# Patient Record
Sex: Male | Born: 1945 | Race: Black or African American | Hispanic: No | Marital: Married | Smoking: Former smoker
Health system: Southern US, Community
[De-identification: ages and names within clinical notes are randomized; demographics above are authoritative.]

## PROBLEM LIST (undated history)

## (undated) DIAGNOSIS — H409 Unspecified glaucoma: Secondary | ICD-10-CM

## (undated) DIAGNOSIS — N183 Chronic kidney disease, stage 3 unspecified: Secondary | ICD-10-CM

## (undated) DIAGNOSIS — E119 Type 2 diabetes mellitus without complications: Secondary | ICD-10-CM

## (undated) DIAGNOSIS — H269 Unspecified cataract: Secondary | ICD-10-CM

## (undated) DIAGNOSIS — M109 Gout, unspecified: Secondary | ICD-10-CM

## (undated) DIAGNOSIS — C159 Malignant neoplasm of esophagus, unspecified: Secondary | ICD-10-CM

## (undated) DIAGNOSIS — I10 Essential (primary) hypertension: Secondary | ICD-10-CM

## (undated) HISTORY — DX: Unspecified cataract: H26.9

## (undated) HISTORY — PX: ORCHIECTOMY: SHX2116

## (undated) HISTORY — DX: Unspecified glaucoma: H40.9

---

## 2018-11-19 ENCOUNTER — Other Ambulatory Visit: Payer: Self-pay

## 2018-11-19 ENCOUNTER — Encounter (HOSPITAL_COMMUNITY): Payer: Self-pay | Admitting: Internal Medicine

## 2018-11-19 ENCOUNTER — Inpatient Hospital Stay (HOSPITAL_COMMUNITY)
Admission: AD | Admit: 2018-11-19 | Discharge: 2018-11-24 | DRG: 375 | Disposition: A | Discharge status: Home Health | Payer: Medicare Other | Source: Other Acute Inpatient Hospital | Attending: Internal Medicine | Admitting: Internal Medicine

## 2018-11-19 DIAGNOSIS — M109 Gout, unspecified: Secondary | ICD-10-CM | POA: Diagnosis present

## 2018-11-19 DIAGNOSIS — K921 Melena: Secondary | ICD-10-CM | POA: Diagnosis present

## 2018-11-19 DIAGNOSIS — E440 Moderate protein-calorie malnutrition: Secondary | ICD-10-CM | POA: Diagnosis present

## 2018-11-19 DIAGNOSIS — Z9079 Acquired absence of other genital organ(s): Secondary | ICD-10-CM | POA: Diagnosis not present

## 2018-11-19 DIAGNOSIS — R55 Syncope and collapse: Secondary | ICD-10-CM | POA: Diagnosis present

## 2018-11-19 DIAGNOSIS — E1122 Type 2 diabetes mellitus with diabetic chronic kidney disease: Secondary | ICD-10-CM | POA: Diagnosis present

## 2018-11-19 DIAGNOSIS — Z87891 Personal history of nicotine dependence: Secondary | ICD-10-CM

## 2018-11-19 DIAGNOSIS — Z923 Personal history of irradiation: Secondary | ICD-10-CM | POA: Diagnosis not present

## 2018-11-19 DIAGNOSIS — I248 Other forms of acute ischemic heart disease: Secondary | ICD-10-CM | POA: Diagnosis present

## 2018-11-19 DIAGNOSIS — I129 Hypertensive chronic kidney disease with stage 1 through stage 4 chronic kidney disease, or unspecified chronic kidney disease: Secondary | ICD-10-CM | POA: Diagnosis present

## 2018-11-19 DIAGNOSIS — K5900 Constipation, unspecified: Secondary | ICD-10-CM | POA: Diagnosis not present

## 2018-11-19 DIAGNOSIS — C159 Malignant neoplasm of esophagus, unspecified: Secondary | ICD-10-CM | POA: Diagnosis present

## 2018-11-19 DIAGNOSIS — I10 Essential (primary) hypertension: Secondary | ICD-10-CM

## 2018-11-19 DIAGNOSIS — D62 Acute posthemorrhagic anemia: Secondary | ICD-10-CM | POA: Diagnosis present

## 2018-11-19 DIAGNOSIS — E876 Hypokalemia: Secondary | ICD-10-CM | POA: Diagnosis present

## 2018-11-19 DIAGNOSIS — J8410 Pulmonary fibrosis, unspecified: Secondary | ICD-10-CM | POA: Diagnosis present

## 2018-11-19 DIAGNOSIS — N183 Chronic kidney disease, stage 3 unspecified: Secondary | ICD-10-CM | POA: Diagnosis present

## 2018-11-19 DIAGNOSIS — R911 Solitary pulmonary nodule: Secondary | ICD-10-CM | POA: Diagnosis present

## 2018-11-19 DIAGNOSIS — K922 Gastrointestinal hemorrhage, unspecified: Secondary | ICD-10-CM | POA: Diagnosis present

## 2018-11-19 DIAGNOSIS — R54 Age-related physical debility: Secondary | ICD-10-CM | POA: Diagnosis present

## 2018-11-19 DIAGNOSIS — E119 Type 2 diabetes mellitus without complications: Secondary | ICD-10-CM

## 2018-11-19 DIAGNOSIS — R918 Other nonspecific abnormal finding of lung field: Secondary | ICD-10-CM | POA: Diagnosis not present

## 2018-11-19 DIAGNOSIS — Z9221 Personal history of antineoplastic chemotherapy: Secondary | ICD-10-CM | POA: Diagnosis not present

## 2018-11-19 DIAGNOSIS — N179 Acute kidney failure, unspecified: Secondary | ICD-10-CM | POA: Diagnosis not present

## 2018-11-19 DIAGNOSIS — Z6820 Body mass index (BMI) 20.0-20.9, adult: Secondary | ICD-10-CM | POA: Diagnosis not present

## 2018-11-19 DIAGNOSIS — N189 Chronic kidney disease, unspecified: Secondary | ICD-10-CM | POA: Diagnosis not present

## 2018-11-19 DIAGNOSIS — K222 Esophageal obstruction: Secondary | ICD-10-CM | POA: Diagnosis present

## 2018-11-19 DIAGNOSIS — C155 Malignant neoplasm of lower third of esophagus: Secondary | ICD-10-CM | POA: Diagnosis present

## 2018-11-19 HISTORY — DX: Chronic kidney disease, stage 3 unspecified: N18.30

## 2018-11-19 HISTORY — DX: Gout, unspecified: M10.9

## 2018-11-19 HISTORY — DX: Type 2 diabetes mellitus without complications: E11.9

## 2018-11-19 HISTORY — DX: Malignant neoplasm of esophagus, unspecified: C15.9

## 2018-11-19 HISTORY — DX: Chronic kidney disease, stage 3 (moderate): N18.3

## 2018-11-19 HISTORY — DX: Essential (primary) hypertension: I10

## 2018-11-19 LAB — GLUCOSE, CAPILLARY
GLUCOSE-CAPILLARY: 116 mg/dL — AB (ref 70–99)
Glucose-Capillary: 116 mg/dL — ABNORMAL HIGH (ref 70–99)
Glucose-Capillary: 123 mg/dL — ABNORMAL HIGH (ref 70–99)
Glucose-Capillary: 93 mg/dL (ref 70–99)

## 2018-11-19 LAB — COMPREHENSIVE METABOLIC PANEL
ALT: 11 U/L (ref 0–44)
AST: 22 U/L (ref 15–41)
Albumin: 2.7 g/dL — ABNORMAL LOW (ref 3.5–5.0)
Alkaline Phosphatase: 55 U/L (ref 38–126)
Anion gap: 7 (ref 5–15)
BUN: 43 mg/dL — ABNORMAL HIGH (ref 8–23)
CO2: 31 mmol/L (ref 22–32)
Calcium: 8.6 mg/dL — ABNORMAL LOW (ref 8.9–10.3)
Chloride: 104 mmol/L (ref 98–111)
Creatinine, Ser: 2.02 mg/dL — ABNORMAL HIGH (ref 0.61–1.24)
GFR, EST AFRICAN AMERICAN: 38 mL/min — AB (ref >60)
GFR, EST NON AFRICAN AMERICAN: 32 mL/min — AB (ref >60)
Glucose, Bld: 122 mg/dL — ABNORMAL HIGH (ref 70–99)
Potassium: 2.6 mmol/L — CL (ref 3.5–5.1)
Sodium: 142 mmol/L (ref 135–145)
Total Bilirubin: 1 mg/dL (ref 0.3–1.2)
Total Protein: 5.6 g/dL — ABNORMAL LOW (ref 6.5–8.1)

## 2018-11-19 LAB — CBC
HCT: 26.4 % — ABNORMAL LOW (ref 39.0–52.0)
Hemoglobin: 8.9 g/dL — ABNORMAL LOW (ref 13.0–17.0)
MCH: 30.1 pg (ref 26.0–34.0)
MCHC: 33.7 g/dL (ref 30.0–36.0)
MCV: 89.2 fL (ref 80.0–100.0)
Platelets: 227 10*3/uL (ref 150–400)
RBC: 2.96 MIL/uL — ABNORMAL LOW (ref 4.22–5.81)
RDW: 14.9 % (ref 11.5–15.5)
WBC: 13.3 10*3/uL — ABNORMAL HIGH (ref 4.0–10.5)
nRBC: 0 % (ref 0.0–0.2)

## 2018-11-19 LAB — MRSA PCR SCREENING: MRSA by PCR: NEGATIVE

## 2018-11-19 LAB — TROPONIN I
Troponin I: 0.8 ng/mL (ref <0.03)
Troponin I: 0.51 ng/mL (ref <0.03)

## 2018-11-19 LAB — POTASSIUM: POTASSIUM: 2.7 mmol/L — AB (ref 3.5–5.1)

## 2018-11-19 LAB — ABO/RH: ABO/RH(D): A NEG

## 2018-11-19 MED ORDER — PANTOPRAZOLE SODIUM 40 MG IV SOLR
40.0000 mg | Freq: Two times a day (BID) | INTRAVENOUS | Status: DC
  Administered 2018-11-19 – 2018-11-24 (×11): 40 mg via INTRAVENOUS
  Filled 2018-11-19 (×10): qty 40

## 2018-11-19 MED ORDER — ACETAMINOPHEN 325 MG PO TABS: 650.0000 mg | ORAL_TABLET | Freq: Four times a day (QID) | ORAL | Status: DC | PRN

## 2018-11-19 MED ORDER — ACETAMINOPHEN 650 MG RE SUPP: 650.0000 mg | Freq: Four times a day (QID) | RECTAL | Status: DC | PRN

## 2018-11-19 MED ORDER — PHENOL 1.4 % MT LIQD
1.0000 | OROMUCOSAL | Status: DC | PRN
  Administered 2018-11-19: 1 via OROMUCOSAL
  Filled 2018-11-19: qty 177

## 2018-11-19 MED ORDER — ONDANSETRON HCL 4 MG PO TABS: 4.0000 mg | ORAL_TABLET | Freq: Four times a day (QID) | ORAL | Status: DC | PRN

## 2018-11-19 MED ORDER — SODIUM CHLORIDE 0.9 % IV SOLN
INTRAVENOUS | Status: DC
  Administered 2018-11-19: 80 mL/h via INTRAVENOUS

## 2018-11-19 MED ORDER — ONDANSETRON HCL 4 MG/2ML IJ SOLN: 4.0000 mg | Freq: Four times a day (QID) | INTRAMUSCULAR | Status: DC | PRN

## 2018-11-19 MED ORDER — POTASSIUM CHLORIDE 10 MEQ/100ML IV SOLN
10.0000 meq | INTRAVENOUS | Status: AC
  Administered 2018-11-19 (×3): 10 meq via INTRAVENOUS
  Filled 2018-11-19 (×3): qty 100

## 2018-11-19 MED ORDER — HYDRALAZINE HCL 20 MG/ML IJ SOLN: 10.0000 mg | INTRAMUSCULAR | Status: DC | PRN

## 2018-11-19 MED ORDER — INSULIN ASPART 100 UNIT/ML ~~LOC~~ SOLN
0.0000 [IU] | Freq: Three times a day (TID) | SUBCUTANEOUS | Status: DC
  Administered 2018-11-19: 2 [IU] via SUBCUTANEOUS

## 2018-11-19 MED ORDER — POTASSIUM CHLORIDE 20 MEQ PO PACK
40.0000 meq | PACK | Freq: Once | ORAL | Status: AC
  Administered 2018-11-19: 20 meq via ORAL
  Filled 2018-11-19: qty 2

## 2018-11-19 NOTE — H&P (Signed)
History and Physical    Lance Herring XLK:[id number] DOB: [birth year] DOA: 11/19/2018  PCP: No primary care provider on file.  Patient coming from: OSH transfer  I have personally briefly reviewed patient's old medical records in [place]  Chief Complaint: GIB  HPI: Lance Herring is a 73 y.o. male with medical history significant of DM2, HTN, CKD stage 3, esophageal CA treated with chemoradiation in [place] completed June 2019.  Patient had previously been doing well since then except for occasional feeling of food getting stuck in lower esophagus.  Patient presented to [facility] ED evening of 1/23 after being found down by wife, initially unresponsive for a few mins.  2 day h/o melena apparently per patient.  Decreased appetite over past 3-4 days.  HGB 6.5, K 2.5, transferred from [facility] to [facility] (no beds apparently).  At [facility] 3u PRBC transfusion brought HGB up to 8.8, K replaced (3.6 per report) underwent EGD for UGIB.  Unfortunately unable to get scope past GE junction due to tumor in the way.  Patient transferred to [facility] for: 1) CVTS consult consideration of surgical management and 2) GI consult consideration of esophageal stent  Review of Systems: As per HPI otherwise 10 point review of systems negative.   Past Medical History:  Diagnosis Date  . CKD (chronic kidney disease) stage 3, GFR 30-59 ml/min (HCC)   . DM2 (diabetes mellitus, type 2) ([place])   . Esophageal cancer ([place])   . Gout   . HTN (hypertension)     Past Surgical History:  Procedure Laterality Date  . ORCHIECTOMY Right    Benign     reports that he quit smoking about 22 years ago. He does not have any smokeless tobacco history on file. He reports that he does not drink alcohol or use drugs.  No Known Allergies  No family history on file.   Prior to Admission medications   Not on File    Physical Exam: Vitals:   11/19/18 0345  BP: 125/64  Pulse: 90  Resp: 18    Temp: 98.6 F (37 C)  TempSrc: Oral  SpO2: 100%  Weight: 67.1 kg  Height: 5\' 11"  (1.803 m)    Constitutional: NAD, calm, comfortable Eyes: PERRL, lids and conjunctivae normal ENMT: Mucous membranes are moist. Posterior pharynx clear of any exudate or lesions.Normal dentition.  Neck: normal, supple, no masses, no thyromegaly Respiratory: clear to auscultation bilaterally, no wheezing, no crackles. Normal respiratory effort. No accessory muscle use.  Cardiovascular: Regular rate and rhythm, no murmurs / rubs / gallops. No extremity edema. 2+ pedal pulses. No carotid bruits.  Abdomen: no tenderness, no masses palpated. No hepatosplenomegaly. Bowel sounds positive.  Musculoskeletal: no clubbing / cyanosis. No joint deformity upper and lower extremities. Good ROM, no contractures. Normal muscle tone.  Skin: no rashes, lesions, ulcers. No induration Neurologic: CN 2-12 grossly intact. Sensation intact, DTR normal. Strength 5/5 in all 4.  Psychiatric: Normal judgment and insight. Alert and oriented x 3. Normal mood.    Labs on Admission: I have personally reviewed following labs and imaging studies  CBC: No results for input(s): WBC, NEUTROABS, HGB, HCT, MCV, PLT in the last 168 hours. Basic Metabolic Panel: No results for input(s): NA, K, CL, CO2, GLUCOSE, BUN, CREATININE, CALCIUM, MG, PHOS in the last 168 hours. GFR: CrCl cannot be calculated (No successful lab value found.). Liver Function Tests: No results for input(s): AST, ALT, ALKPHOS, BILITOT, PROT, ALBUMIN in the last 168 hours. No  results for input(s): LIPASE, AMYLASE in the last 168 hours. No results for input(s): AMMONIA in the last 168 hours. Coagulation Profile: No results for input(s): INR, PROTIME in the last 168 hours. Cardiac Enzymes: No results for input(s): CKTOTAL, CKMB, CKMBINDEX, TROPONINI in the last 168 hours. BNP (last 3 results) No results for input(s): PROBNP in the last 8760 hours. HbA1C: No results for  input(s): HGBA1C in the last 72 hours. CBG: No results for input(s): GLUCAP in the last 168 hours. Lipid Profile: No results for input(s): CHOL, HDL, LDLCALC, TRIG, CHOLHDL, LDLDIRECT in the last 72 hours. Thyroid Function Tests: No results for input(s): TSH, T4TOTAL, FREET4, T3FREE, THYROIDAB in the last 72 hours. Anemia Panel: No results for input(s): VITAMINB12, FOLATE, FERRITIN, TIBC, IRON, RETICCTPCT in the last 72 hours. Urine analysis: No results found for: COLORURINE, APPEARANCEUR, LABSPEC, PHURINE, GLUCOSEU, HGBUR, BILIRUBINUR, KETONESUR, PROTEINUR, UROBILINOGEN, NITRITE, LEUKOCYTESUR  Radiological Exams on Admission: No results found.  EKG: Independently reviewed.  Assessment/Plan Principal Problem:   UGIB (upper gastrointestinal bleed) Active Problems:   Acute blood loss anemia   Esophageal cancer (HCC)   DM2 (diabetes mellitus, type 2) (HCC)   HTN (hypertension)   CKD (chronic kidney disease) stage 3, GFR 30-59 ml/min ([place])    1. UGIB - 1. 2 or 3 days since last melanotic stool per patient (makes me hopeful) 2. S/p 3u PRBC 3. Repeat CBC now 4. IV PPI 5. IVF NS at 80 6. Tele monitor 7. GI consult obviously 2. Esophageal CA - Recurrent 1. Unable to pass EGD past tumor apparently 2. But is tolerating clear liquids at time of transfer 3. Will leave patient on clear liquid diet for the moment, no solids and no pills 4. GI and CVTS consults in AM: Dr. Servando Snare and Dr. Benson Norway apparently were contacted on phone prior to transfer. 3. DM2 - 1. Mod scale SSI AC 4. HTN - 1. PRN hydralazine 5. Demand ischemia - 1. Trop bumped to 1.5 before trending down at OSH 2. Will continue to trend trop 3. No CP, believed by their cardiologist to be demand ischemia 4. No acute EKG changes 5. And not a great candidate for anti coags / anti platelets when he has a UGIB that we cant get to anyhow. 6. CKD stage 3 - 1. Creat 2.1 at transfer, which is about baseline 2. CMP on admission  this morning pending  DVT prophylaxis: SCDs Code Status: Full Family Communication: Wife at bedside Disposition Plan: Home after admit Consults called: None, call GI and CVTS in AM Admission status: Admit to inpatient  Severity of Illness: The appropriate patient status for this patient is INPATIENT. Inpatient status is judged to be reasonable and necessary in order to provide the required intensity of service to ensure the patient's safety. The patient's presenting symptoms, physical exam findings, and initial radiographic and laboratory data in the context of their chronic comorbidities is felt to place them at high risk for further clinical deterioration. Furthermore, it is not anticipated that the patient will be medically stable for discharge from the hospital within 2 midnights of admission. The following factors support the patient status of inpatient.   " The patient's presenting symptoms include Melena, syncopy. " The initial radiographic and laboratory data are worrisome because of initial HGB 6.5, unable to get EGD past GE junction due to tumor, need for transfusion of 3u PRBC already. " The chronic co-morbidities include Esophageal CA, HTN, DM2.   * I certify that at the point of  admission it is my clinical judgment that the patient will require inpatient hospital care spanning beyond 2 midnights from the point of admission due to high intensity of service, high risk for further deterioration and high frequency of surveillance required.Etta Quill DO [facility] Pager [phone] Only works nights!  If 7AM-7PM, please contact the primary day team physician taking care of patient  [email or web address] Password [facility]  11/19/2018, 4:04 AM

## 2018-11-19 NOTE — Progress Notes (Addendum)
Please see H&P from this morning.  Patient seen and examined.  73 year old male with history of diabetes pulmonary hypertension gout esophageal cancer diagnosed in April 2018 and treated with chemoradiation in [place], completed therapy in June 2009 presented to [facility] ED with complaints of melena and syncopal episodes.  His hemoglobin was noted to be 6.5 on presentation.  This was related to upper GI bleed secondary to known malignancy and acute blood loss related hypotension.  There was no GI available at [facility] and patient transferred to [facility] in [place] where patient was evaluated by GI/nephrology patient did have mild elevated troponin in the setting of upper GI bleed/hypotension.  Patient has chronic kidney disease stage III with GFR of 45% at baseline per [place] records and per nephrology note from [facility].  Patient known to have pulmonary nodules in the past that were followed by oncology.  Not sure if metastatic work-up repeated at [facility], he did have repeat EGD showing esophageal mass but scope could not be passed beyond GE junction.  GI and CT surgery have been notified of patient's arrival.  Per GI, staff at [facility] was advised that patient likely not a candidate for stent.  However patient was accepted for CT surgery evaluation albeit a good possibility that he is not a candidate for surgery either. Currently patient able to tolerate clear liquid diet without nausea or vomiting.  Reports that he is able to pass gas but no bowel movement so far.  Repeat labs for this morning ordered.  His CODE STATUS is full code.  Awaiting formal evaluation/note from CT surgery and GI.  If patient deemed not to be a candidate for any interventions, consider palliative care evaluation and downgrade to MedSurg floor in a.m.  Follow-up hemoglobin and transfuse as needed

## 2018-11-19 NOTE — Progress Notes (Signed)
Trop was 0.8 and K+ 2.9 morning lab. Replaced potassium 10 meq IVPB x 3 and po 20 meq because pt couldn't drink second dose of potassium pocket. Trop was down from previous, Pt need only second trop if trop is lower than first one. Second trop was 0.51 and d/c third trop. Pt c/o constipation, BM was few days ago. Paging MD for constipation medications, still waiting. HS [employer]

## 2018-11-19 NOTE — Consult Note (Addendum)
[address]       [place] [address]             [phone]        Lance Herring [place] Medical Record #[id number] Date of Birth: [birth year]  Referring: Dr Earnest Conroy Primary Care: No primary care provider on file. Primary Cardiologist:No primary care provider on file.  Chief Complaint:   Trouble swallowing and gi bleed.  History of Present Illness:     Patient is a 73 year old male who is been followed in [place] for adenocarcinoma of the distal esophagus he saw his oncologist as recent as November 15, 2018 he had presented with a obstructing esophageal mass and underwent radiation and chemotherapy during 03/2017- 05/2017  reports suggest there may been retroperitoneal lymph lymphadenopathy and pulmonary nodules were being evaluated notes indicate that restaging CT scans done last year and showed response to treatment patient was seen previously by surgeon in [place] was thought too frail for surgery in August 2019 the patient had declined surgery.  He now presents with continued weight loss difficulty swallowing and GI bleed with endoscopy was done in [facility] January 24 showing stricture at the esophageal junction scope could not transverse the lesion and full exam of the stomach could not be completed.  Notes indicate the patient has had continuing rising CEA now 3.9  At some point in the past the patient had a PEG tube temporary lead during treatment is not present currently at the time of original diagnosis EUS was performed but was limited because the scope could not be fully passed, initially the lesion was staged as T2 ,No,  MO in 2018.  Current Activity/ Functional Status: Patient is independent with mobility/ambulation, transfers, ADL's, IADL's.   Zubrod Score: At the time of surgery this patient's most appropriate activity status/level should be described as: []     0    Normal activity, no symptoms []     1    Restricted in physical  strenuous activity but ambulatory, able to do out light work [x]     2    Ambulatory and capable of self care, unable to do work activities, up and about                 more than 50%  Of the time                            []     3    Only limited self care, in bed greater than 50% of waking hours []     4    Completely disabled, no self care, confined to bed or chair []     5    Moribund  Past Medical History:  Diagnosis Date  . CKD (chronic kidney disease) stage 3, GFR 30-59 ml/min (HCC)   . DM2 (diabetes mellitus, type 2) ([place])   . Esophageal cancer ([place])   . Gout   . HTN (hypertension)     Past Surgical History:  Procedure Laterality Date  . ORCHIECTOMY Right    Benign    Social History   Tobacco Use  Smoking Status Former Smoker  . Types: Cigarettes  . Last attempt to quit: 1998  . Years since quitting: 22.0    Social History   Substance and Sexual Activity  Alcohol Use Never  . Frequency: Never     No Known Allergies  Current Facility-Administered Medications  Medication  Dose Route Frequency Provider Last Rate Last Dose  . 0.9 %  sodium chloride infusion   Intravenous Continuous Etta Quill, DO 80 mL/hr at 11/19/18 0700    . acetaminophen (TYLENOL) tablet 650 mg  650 mg Oral Q6H PRN Etta Quill, DO       Or  . acetaminophen (TYLENOL) suppository 650 mg  650 mg Rectal Q6H PRN Etta Quill, DO      . hydrALAZINE (APRESOLINE) injection 10 mg  10 mg Intravenous Q4H PRN Etta Quill, DO      . insulin aspart (novoLOG) injection 0-15 Units  0-15 Units Subcutaneous TID WC Etta Quill, DO      . ondansetron [facility]) tablet 4 mg  4 mg Oral Q6H PRN Etta Quill, DO       Or  . ondansetron [facility]) injection 4 mg  4 mg Intravenous Q6H PRN Etta Quill, DO      . pantoprazole (PROTONIX) injection 40 mg  40 mg Intravenous Q12H Jennette Kettle M, DO   40 mg at 11/19/18 0915  . phenol (CHLORASEPTIC) mouth spray 1 spray  1 spray Mouth/Throat PRN  Etta Quill, DO        No medications prior to admission.    History reviewed. No pertinent family history.   Review of Systems:   Pertinent items are noted in HPI.     Cardiac Review of Systems: Y or  [    ]= no  Chest Pain [   y ]  Resting SOB [ y  ] Exertional SOB  [ y ]  Orthopnea Blue.Reese  ]   Pedal Edema [ n  ]    Palpitations Florencio.Farrier  ] Syncope  [ n ]   Presyncope [  n ]  General Review of Systems: [Y] = yes [  ]=no Constitional: recent weight change Blue.Reese  ]; anorexia Blue.Reese  ]; fatigue [  ]; nausea Blue.Reese  ]; night sweats [  ]; fever [  ]; or chills [  ]                                                               Dental: Last Dentist visit:   Eye : blurred vision [  ]; diplopia [   ]; vision changes [  ];  Amaurosis fugax[  ]; Resp: cough [  ];  wheezing[  ];  hemoptysis[  ]; shortness of breath[  ]; paroxysmal nocturnal dyspnea[  ]; dyspnea on exertion[  ]; or orthopnea[  ];  GI:  gallstones[  ], vomiting[  ];  dysphagia[ y ]; Jenna.Grumbles  ];  hematochezia [  ]; heartburn[ y ];   Hx of  Colonoscopy[  ]; GU: kidney stones [  ]; hematuria[  ];   dysuria [  ];  nocturia[  ];  history of     obstruction [  ]; urinary frequency [  ]             Skin: rash, swelling[  ];, hair loss[  ];  peripheral edema[  ];  or itching[  ]; Musculosketetal: myalgias[  ];  joint swelling[  ];  joint erythema[  ];  joint pain[  ];  back pain[  ];  Heme/Lymph: bruising[  ];  bleeding[  ];  anemia[  ];  Neuro: TIA[  ];  headaches[  ];  stroke[  ];  vertigo[  ];  seizures[  ];   paresthesias[  ];  difficulty walking[  ];  Psych:depression[  ]; anxiety[  ];  Endocrine: diabetes[  ];  thyroid dysfunction[  ];                Physical Exam: BP 137/78 (BP Location: Right Arm)   Pulse 85   Temp 98.6 F (37 C) (Oral)   Resp 18   Ht 5\' 11"  (1.803 m)   Wt 67.1 kg   SpO2 97%   BMI 20.63 kg/m    General appearance: alert, cooperative and no distress Head: Normocephalic, without obvious abnormality,  atraumatic Neck: no adenopathy, no carotid bruit, no JVD, supple, symmetrical, trachea midline and thyroid not enlarged, symmetric, no tenderness/mass/nodules Lymph nodes: Cervical, supraclavicular, and axillary nodes normal. Resp: diminished breath sounds bibasilar Back: symmetric, no curvature. ROM normal. No CVA tenderness. Cardio: regular rate and rhythm, S1, S2 normal, no murmur, click, rub or gallop GI: Patient has a thin abdomen currently no PEG tube is in place but it has been 1 in the past, Extremities: extremities normal, atraumatic, no cyanosis or edema and Homans sign is negative, no sign of DVT Neurologic: Grossly normal Patient is thin cachectic, right Port-A-Cath is in place, there is no palpable adenopathy in the neck or supraclavicular areas  Diagnostic Studies & Laboratory data:     Recent Radiology Findings:  We have no current staging CT scans or PET scan to review    Recent Lab Findings: Lab Results  Component Value Date   WBC 13.3 (H) 11/19/2018   HGB 8.9 (L) 11/19/2018   HCT 26.4 (L) 11/19/2018   PLT 227 11/19/2018      Assessment / Plan:      Patient with recurrent nearly obstructing esophageal cancer distal/GE junction has not been fully evaluated for current stage but was treated with radiation and chemotherapy in 2018.  Notes from [place] suggest abdominal adenopathy has been noted in the past and also pulmonary nodules, we have no confirmation scans of this information.  The patient had been evaluated by surgery in 2018, it appears that neither the patient nor the surgeon wish to proceed with surgical intervention. Patient needs increased nutritional support  The patient is not an operative candidate for esophagectomy.  Stenting of the esophagus for palliation, further chemotherapy for palliation are considerations-the patient will need to decide where he wishes to continue to get oncology care here or in [place].  Consider oncology consultation here  if the patient is wishes to stay here.  If he is to remain here for his oncology care restaging scans should be considered. Consider placement of peg ( had on previously for a year)  Consider palliative care.     Grace Isaac MD      [address] [place] [address] Office [phone]   Beeper [phone]  11/19/2018 9:45 AM

## 2018-11-19 NOTE — Progress Notes (Signed)
I discussed the case with the physician in [facility].  From a GI standpoint, I did not accept the transfer.  The patient presented with bleeding and an EGD was performed by a surgeon, who could not pass through the GE junction, as a result of a mass.  He has a history of an esophageal cancer and the cancer was noted to be friable.  The patient is able to tolerate liquids and his oncology care was in [place], but he wanted care closer to home.  The source of the bleeding was reported form the esophageal mass.  The reasons I did not accept the transfer was that the mass was friable and there is no maneuver that I can perform to arrest any bleeding.  The patient is currently able to tolerate liquids and I was told that he was not end stage disease.  Since he is not completely obstructed and he is not end stage, there is no reason to place an esophageal stent.  The best course of action was to have him establish oncologic care locally.  At this time there are no GI services that I can offer to the patient.

## 2018-11-20 ENCOUNTER — Encounter (HOSPITAL_COMMUNITY): Payer: Self-pay | Admitting: *Deleted

## 2018-11-20 ENCOUNTER — Inpatient Hospital Stay (HOSPITAL_COMMUNITY): Payer: Medicare Other

## 2018-11-20 DIAGNOSIS — N179 Acute kidney failure, unspecified: Secondary | ICD-10-CM

## 2018-11-20 DIAGNOSIS — R55 Syncope and collapse: Secondary | ICD-10-CM

## 2018-11-20 DIAGNOSIS — C159 Malignant neoplasm of esophagus, unspecified: Secondary | ICD-10-CM

## 2018-11-20 DIAGNOSIS — N189 Chronic kidney disease, unspecified: Secondary | ICD-10-CM

## 2018-11-20 DIAGNOSIS — R918 Other nonspecific abnormal finding of lung field: Secondary | ICD-10-CM

## 2018-11-20 LAB — BASIC METABOLIC PANEL
ANION GAP: 9 (ref 5–15)
Anion gap: 10 (ref 5–15)
BUN: 27 mg/dL — ABNORMAL HIGH (ref 8–23)
BUN: 20 mg/dL (ref 8–23)
CHLORIDE: 100 mmol/L (ref 98–111)
CO2: 29 mmol/L (ref 22–32)
CO2: 27 mmol/L (ref 22–32)
Calcium: 8.2 mg/dL — ABNORMAL LOW (ref 8.9–10.3)
Calcium: 8.6 mg/dL — ABNORMAL LOW (ref 8.9–10.3)
Chloride: 102 mmol/L (ref 98–111)
Creatinine, Ser: 1.88 mg/dL — ABNORMAL HIGH (ref 0.61–1.24)
Creatinine, Ser: 1.85 mg/dL — ABNORMAL HIGH (ref 0.61–1.24)
GFR calc Af Amer: 41 mL/min — ABNORMAL LOW (ref >60)
GFR calc Af Amer: 42 mL/min — ABNORMAL LOW (ref >60)
GFR calc non Af Amer: 35 mL/min — ABNORMAL LOW (ref >60)
GFR calc non Af Amer: 36 mL/min — ABNORMAL LOW (ref >60)
GLUCOSE: 133 mg/dL — AB (ref 70–99)
Glucose, Bld: 113 mg/dL — ABNORMAL HIGH (ref 70–99)
POTASSIUM: 2.8 mmol/L — AB (ref 3.5–5.1)
Potassium: 3.4 mmol/L — ABNORMAL LOW (ref 3.5–5.1)
SODIUM: 139 mmol/L (ref 135–145)
Sodium: 138 mmol/L (ref 135–145)

## 2018-11-20 LAB — GLUCOSE, CAPILLARY
Glucose-Capillary: 107 mg/dL — ABNORMAL HIGH (ref 70–99)
Glucose-Capillary: 116 mg/dL — ABNORMAL HIGH (ref 70–99)
Glucose-Capillary: 130 mg/dL — ABNORMAL HIGH (ref 70–99)
Glucose-Capillary: 123 mg/dL — ABNORMAL HIGH (ref 70–99)

## 2018-11-20 LAB — CBC
HCT: 22.8 % — ABNORMAL LOW (ref 39.0–52.0)
HEMOGLOBIN: 7.8 g/dL — AB (ref 13.0–17.0)
MCH: 31 pg (ref 26.0–34.0)
MCHC: 34.2 g/dL (ref 30.0–36.0)
MCV: 90.5 fL (ref 80.0–100.0)
Platelets: 216 10*3/uL (ref 150–400)
RBC: 2.52 MIL/uL — ABNORMAL LOW (ref 4.22–5.81)
RDW: 15 % (ref 11.5–15.5)
WBC: 10.3 10*3/uL (ref 4.0–10.5)
nRBC: 0 % (ref 0.0–0.2)

## 2018-11-20 LAB — MAGNESIUM: Magnesium: 2 mg/dL (ref 1.7–2.4)

## 2018-11-20 MED ORDER — POTASSIUM CHLORIDE IN NACL 40-0.9 MEQ/L-% IV SOLN
INTRAVENOUS | Status: AC
  Administered 2018-11-20: 75 mL/h via INTRAVENOUS
  Administered 2018-11-21: 50 mL/h via INTRAVENOUS
  Filled 2018-11-20 (×2): qty 1000

## 2018-11-20 MED ORDER — SODIUM CHLORIDE 0.9 % IV SOLN: INTRAVENOUS | Status: DC

## 2018-11-20 MED ORDER — POTASSIUM CHLORIDE 10 MEQ/100ML IV SOLN
10.0000 meq | INTRAVENOUS | Status: DC
  Administered 2018-11-20: 10 meq via INTRAVENOUS
  Filled 2018-11-20: qty 100

## 2018-11-20 MED ORDER — BISACODYL 10 MG RE SUPP
10.0000 mg | Freq: Once | RECTAL | Status: DC
  Filled 2018-11-20: qty 1

## 2018-11-20 MED ORDER — POTASSIUM CHLORIDE 10 MEQ/100ML IV SOLN
10.0000 meq | INTRAVENOUS | Status: AC
  Administered 2018-11-20 (×6): 10 meq via INTRAVENOUS
  Filled 2018-11-20 (×2): qty 100

## 2018-11-20 MED ORDER — DOCUSATE SODIUM 50 MG/5ML PO LIQD
100.0000 mg | Freq: Two times a day (BID) | ORAL | Status: DC
  Administered 2018-11-20 – 2018-11-21 (×2): 100 mg via ORAL
  Filled 2018-11-20 (×4): qty 10

## 2018-11-20 MED ORDER — BISACODYL 10 MG RE SUPP
10.0000 mg | Freq: Every day | RECTAL | Status: DC | PRN
  Administered 2018-11-21: 10 mg via RECTAL

## 2018-11-20 MED ORDER — POTASSIUM CHLORIDE 10 MEQ/50ML IV SOLN
10.0000 meq | INTRAVENOUS | Status: AC
  Administered 2018-11-20 (×4): 10 meq via INTRAVENOUS
  Filled 2018-11-20 (×4): qty 50

## 2018-11-20 MED ORDER — INSULIN ASPART 100 UNIT/ML ~~LOC~~ SOLN
0.0000 [IU] | Freq: Three times a day (TID) | SUBCUTANEOUS | Status: DC
  Administered 2018-11-21: 2 [IU] via SUBCUTANEOUS
  Administered 2018-11-22 – 2018-11-24 (×2): 1 [IU] via SUBCUTANEOUS

## 2018-11-20 NOTE — Progress Notes (Signed)
PROGRESS NOTE   Lance Herring  JAS:[id number]    DOB: 10-23-1946    DOA: 11/19/2018  PCP: No primary care provider on file.   I have briefly reviewed patients previous medical records in [facility].  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  Hemoglobin 6.5, potassium 2.5, initially transferred from [facility] to [facility] health where he received 3 units PRBC transfusion, potassium replaced, underwent EGD for UGIB but unfortunately unable to get the scope past the GE junction due to tumor.  He was then transferred to [facility] for CVTS consultation for consideration for surgery and GI consultation for consideration for esophageal stent.  Oncology consulted 1/26.  IR consulted for G-tube placement.   Assessment & Plan:   Principal Problem:   UGIB (upper gastrointestinal bleed) Active Problems:   Acute blood loss anemia   Esophageal cancer (HCC)   DM2 (diabetes mellitus, type 2) (HCC)   HTN (hypertension)   CKD (chronic kidney disease) stage 3, GFR 30-59 ml/min (HCC)   Acute upper GI bleed - Most likely due to friable esophageal cancer - EGD attempted at OSH but unable to get the scope past the GE junction due to tumor. - As per GI input here, the mass was friable and there is no maneuver that can be performed to arrest any bleeding.  Also patient able to tolerate liquids, hence not end-stage, not completely obstructed and so no reason to place an esophageal stent. - GI bleeding seems to have stopped for now.  Remains at risk for recurrent bleeding. - Continue PPI for now.  Acute blood loss anemia -Secondary to upper GI  bleed. -Transfused 3 units of PRBC at outside hospital. -Initial hemoglobin at outside hospital was 6.5.  This improved to 8.9, down to 7.8.  No clinically active GI bleeding at this time.  Follow CBC in a.m. and transfuse if hemoglobin 7 or less.  Syncope -Most likely due to symptomatic acute blood loss anemia. -Patient counseled that he should not drive for 6 months.  Acute on stage III chronic kidney disease -Baseline creatinine not known. -Presented with creatinine of 2.02.  This is improved to 1.88.  Continue IV fluids and follow BMP closely.  Hypokalemia -Potassium 2.8 on 1/25. -Replace IV and closely follow.  Check and replace magnesium as needed.  Esophageal cancer -Patient states that he saw his oncologist in [place] on 11/15/2018 at which time he forgot to mention about his dysphagia issues.  He was supposed to follow-up in February with PET scan. -Patient and spouse indicate that he now wishes to continue his cancer care locally. -I consulted Dr. Lindi Adie who will see him today.  He recommended proceeding with G-tube placement for nutritional needs, once stable can be discharged home for outpatient close follow-up at the cancer center for PET scan and further management.  They will address palliative care as outpatient. -Continue clear liquids.  IR consulted for G-tube placement. -As per CVTS input, not candidate for esophagectomy.  Type II DM -Reasonable inpatient control.  Continue SSI.  Elevated troponin -Suspect demand ischemia from acute GI bleed and acute blood loss anemia. -Troponin peaked at 1.5, has trended  down.  No chest pain or acute EKG changes. -Had been seen by cardiology at OSH and felt also to be due to demand ischemia. -Not a candidate for antiplatelets due to GI bleed.  Essential hypertension -Controlled.  Moderate protein calorie malnutrition -Dietitian consulted.    DVT prophylaxis: SCD Code Status: Full Family Communication: Discussed in detail  with patient's spouse at bedside, updated care and answered questions. Disposition: DC home pending clinical improvement   Consultants:  CVTS Medical oncology  Procedures:  None  Antimicrobials:  None   Subjective: No BM for 3 days.  Able to tolerate small amount of liquids but no solids.  No nausea or vomiting.  Reports 8 to 9 pounds weight loss over the last several days.  No chest pain, dyspnea, dizziness or lightheadedness.  States that he had a G-tube in the past and willing to have another one placed.  ROS: As above, otherwise negative.  Objective:  Vitals:   11/20/18 0000 11/20/18 0400 11/20/18 0421 11/20/18 0746  BP:   133/65 120/69  Pulse: 88 78 83 83  Resp: 16 14 13 16   Temp:   99.2 F (37.3 C) 98.3 F (36.8 C)  TempSrc:   Oral Oral  SpO2:   97% 97%  Weight:      Height:        Examination:  General exam: Pleasant elderly male, moderately built and poorly nourished lying comfortably propped up in bed.  Oral mucosa slightly dry. Respiratory system: Clear to auscultation. Respiratory effort normal.  Right Port-A-Cath. Cardiovascular system: S1 & S2 heard, RRR. No JVD, murmurs, rubs, gallops or clicks. No pedal edema. Gastrointestinal system: Abdomen is nondistended, soft and nontender. No organomegaly or masses felt. Normal bowel sounds heard. Central nervous system: Alert and oriented. No focal neurological deficits. Extremities: Symmetric 5 x 5 power. Skin: No rashes, lesions or ulcers Psychiatry: Judgement and insight appear normal. Mood & affect appropriate.     Data Reviewed: I have personally reviewed following labs and imaging studies  CBC: Recent Labs  Lab 11/19/18 0902 11/20/18 0533  WBC 13.3* 10.3  HGB 8.9* 7.8*  HCT 26.4* 22.8*  MCV 89.2 90.5  PLT 227 297   Basic Metabolic Panel: Recent Labs  Lab 11/19/18 0902 11/19/18 1742 11/20/18 0533  NA 142  --  139  K 2.6* 2.7* 2.8*  CL 104  --  100  CO2 31  --  29  GLUCOSE 122*  --   113*  BUN 43*  --  27*  CREATININE 2.02*  --  1.88*  CALCIUM 8.6*  --  8.2*   Liver Function Tests: Recent Labs  Lab 11/19/18 0902  AST 22  ALT 11  ALKPHOS 55  BILITOT 1.0  PROT 5.6*  ALBUMIN 2.7*   Cardiac Enzymes: Recent Labs  Lab 11/19/18 0902 11/19/18 1742  TROPONINI 0.80* 0.51*   HbA1C: No results for input(s): HGBA1C in the last 72 hours. CBG: Recent Labs  Lab 11/19/18 0746 11/19/18 1131 11/19/18 1632 11/19/18 2128 11/20/18 0745  GLUCAP 116* 116* 123* 93 107*    Recent Results (from the past 240 hour(s))  MRSA PCR Screening     Status: None   Collection Time: 11/19/18  3:20 AM  Result Value Ref Range Status   MRSA by PCR NEGATIVE NEGATIVE Final    Comment:        The GeneXpert MRSA Assay (FDA approved for NASAL specimens only), is one component of a comprehensive MRSA colonization surveillance program. It is  not intended to diagnose MRSA infection nor to guide or monitor treatment for MRSA infections. Performed at [facility], [place] [address] [place], [place] [address]          Radiology Studies: No results found.      Scheduled Meds: . insulin aspart  0-15 Units Subcutaneous TID WC  . pantoprazole (PROTONIX) IV  40 mg Intravenous Q12H   Continuous Infusions: . potassium chloride 10 mEq (11/20/18 0752)     LOS: 1 day     Vernell Leep, MD, FACP, [facility]. [facility]  To contact the attending provider between 7A-7P or the covering provider during after hours 7P-7A, please log into the web site [email or web address] and access using universal [place] password for that web site. If you do not have the password, please call the hospital operator.  11/20/2018, 9:04 AM

## 2018-11-20 NOTE — Evaluation (Signed)
Clinical/Bedside Swallow Evaluation Patient Details  Name: Lance Herring MRN: [id number] Date of Birth: 08-21-1946  Today's Date: 1/Lance/2020 Time: SLP Start Time (ACUTE ONLY): 1059 SLP Stop Time (ACUTE ONLY): 1104 SLP Time Calculation (min) (ACUTE ONLY): 5 min  Past Medical History:  Past Medical History:  Diagnosis Date  . CKD (chronic kidney disease) stage 3, GFR 30-59 ml/min (HCC)   . DM2 (diabetes mellitus, type 2) ([place])   . Esophageal cancer ([place])   . Gout   . HTN (hypertension)    Past Surgical History:  Past Surgical History:  Procedure Laterality Date  . 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  Hemoglobin 6.5, potassium 2.5, initially transferred from [facility] to [facility] health where he received 3 units PRBC transfusion, potassium replaced, underwent EGD for UGIB but unfortunately unable to get the scope past the GE junction due to tumor.  He was then transferred to [facility] for CVTS consultation for consideration for surgery and GI consultation for consideration for esophageal stent.  Oncology consulted 1/Lance.  IR consulted for G-tube placement.  No chest imaging available at present   Assessment / Plan / Recommendation Clinical Impression  Pt tolerated thin liquid with no clinical s/s of aspiration.  No other consistencies were tested given UGIB and esophageal mass.  Pt c/o odynophagia 2/2 UGI endoscopy.  Pt appears safe to continue clear liquid diet, but may not be able to meet nutritional needs.  SLP will sign off at present; if further evaluation of advanced consistencies, please reconsult SLP when medically  appropriate. 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  Hemoglobin 6.5, potassium 2.5, initially transferred from [facility] to [facility] health where he received 3 units PRBC transfusion, potassium replaced, underwent EGD for UGIB but unfortunately unable to get the scope past the GE junction due to tumor.  He was then transferred to [facility] for CVTS consultation for consideration for surgery and GI consultation for consideration for esophageal stent.  Oncology consulted 1/Lance.  IR consulted for G-tube placement.  No chest imaging available at present Type of Study: Bedside Swallow Evaluation Diet Prior to this Study: Thin liquids Temperature Spikes Noted: No History of Recent Intubation: No Behavior/Cognition: Alert;Cooperative;Pleasant mood Oral Cavity Assessment: Within Functional Limits Oral Cavity - Dentition: Missing dentition Self-Feeding Abilities: Able to feed self Patient Positioning: Upright in bed Baseline Vocal Quality:  Normal Volitional Cough: Strong Volitional Swallow: Able to elicit    Oral/Motor/Sensory Function Overall Oral Motor/Sensory Function: Within functional limits   Ice Chips Ice chips: Not tested   Thin Liquid  Thin Liquid: Within functional limits Presentation: Cup;Straw    Nectar Thick Nectar Thick Liquid: Not tested   Honey Thick Honey Thick Liquid: Not tested   Puree Puree: Not tested   Solid     Solid: Not tested      Lance Savage, MA, [place] Office: [phone]; Pager (1/Lance): [phone] 1/Lance/2020,11:23 AM

## 2018-11-20 NOTE — Progress Notes (Signed)
HEMATOLOGY-ONCOLOGY PROGRESS NOTE  SUBJECTIVE: Relapsed esophageal cancer Patient was diagnosed with the stage II or III esophageal cancer in [place] late 2018.  We do not have path reports.  He was apparently treated with concurrent chemoradiation.  Subsequently had a complete response.  There was a discussion at that time about surgery but patient and his doctors decided not to pursue it.  He had few additional PET scans the last one was in September 2019.  He was informed that there was no evidence of cancer and another PET scan was supposed to be done in February 2020.   He had an episode of syncope and was taken to [facility] with a hemoglobin of 6.5.  He received 3 units of PRBC.  He was then transferred to [facility] where he underwent an upper endoscopy and biopsies.  He was transferred to [facility] for evaluation consideration for surgery as well as stent placement.  Dr. Benson Norway determined that he is not a good candidate for stent placement.  Dr. Servando Snare was consulted who wanted the patient appropriately staged before making any decisions on surgical plans.  They were lung nodules that were concerning.  Patient had a CT chest abdomen pelvis that revealed a left upper lung nodule 1 cm as well as a granulomas in the lungs as well as hilar mediastinal areas.  There was no other distant metastatic disease.  OBJECTIVE: REVIEW OF SYSTEMS:   Constitutional: Denies fevers, chills or abnormal weight loss Eyes: Denies blurriness of vision Ears, nose, mouth, throat, and face: Denies mucositis or sore throat Respiratory: Denies cough, dyspnea or wheezes Cardiovascular: Denies palpitation, chest discomfort Gastrointestinal: Inability to swallow solids and liquids Skin: Denies abnormal skin rashes Lymphatics: Denies new lymphadenopathy or easy bruising Neurological:Denies numbness, tingling or new weaknesses Behavioral/Psych: Mood is stable, no new changes  Extremities: No lower  extremity edema  All other systems were reviewed with the patient and are negative.  I have reviewed the past medical history, past surgical history, social history and family history with the patient and they are unchanged from previous note.   PHYSICAL EXAMINATION: ECOG PERFORMANCE STATUS: 2 - Symptomatic, <50% confined to bed  Vitals:   11/20/18 0746 11/20/18 1226  BP: 120/69   Pulse: 83   Resp: 16   Temp: 98.3 F (36.8 C) 98.9 F (37.2 C)  SpO2: 97%    Filed Weights   11/19/18 0345  Weight: 147 lb 14.9 oz (67.1 kg)    GENERAL:alert, no distress and comfortable SKIN: skin color, texture, turgor are normal, no rashes or significant lesions EYES: normal, Conjunctiva are pink and non-injected, sclera clear OROPHARYNX:no exudate, no erythema and lips, buccal mucosa, and tongue normal  NECK: supple, thyroid normal size, non-tender, without nodularity LYMPH:  no palpable lymphadenopathy in the cervical, axillary or inguinal LUNGS: clear to auscultation and percussion with normal breathing effort HEART: regular rate & rhythm and no murmurs and no lower extremity edema ABDOMEN:abdomen soft, non-tender and normal bowel sounds Musculoskeletal:no cyanosis of digits and no clubbing  NEURO: alert & oriented x 3 with fluent speech, no focal motor/sensory deficits  LABORATORY DATA:  I have reviewed the data as listed CMP Latest Ref Rng & Units 11/20/2018 11/19/2018 11/19/2018  Glucose 70 - 99 mg/dL 113(H) - 122(H)  BUN 8 - 23 mg/dL 27(H) - 43(H)  Creatinine 0.61 - 1.24 mg/dL 1.88(H) - 2.02(H)  Sodium 135 - 145 mmol/L 139 - 142  Potassium 3.5 - 5.1 mmol/L 2.8(L) 2.7(LL) 2.6(LL)  Chloride  98 - 111 mmol/L 100 - 104  CO2 22 - 32 mmol/L 29 - 31  Calcium 8.9 - 10.3 mg/dL 8.2(L) - 8.6(L)  Total Protein 6.5 - 8.1 g/dL - - 5.6(L)  Total Bilirubin 0.3 - 1.2 mg/dL - - 1.0  Alkaline Phos 38 - 126 U/L - - 55  AST 15 - 41 U/L - - 22  ALT 0 - 44 U/L - - 11    Lab Results  Component Value  Date   WBC 10.3 11/20/2018   HGB 7.8 (L) 11/20/2018   HCT 22.8 (L) 11/20/2018   MCV 90.5 11/20/2018   PLT 216 11/20/2018    ASSESSMENT AND PLAN: 1.  Relapsed esophageal cancer: Plan: 1.  Obtain upper endoscopy and pathology from the biopsy 2.  Obtain records of his chemoradiation. 3.  Consult radiation oncology to see if there is any role of palliative radiation for the bleed 4.  Outpatient PET CT scan and or biopsy of the left upper lung mass 5.  Patient needs to follow with the GI oncology team either by Dr. Burr Medico or Dr. Thamas Jaegers.  I will cc our GI navigator Dawn to assist this patient in getting the records as well as get appointments to see the GI oncology team.  Because the patient has granulomas, the left upper lung nodule could be another granuloma.  If that was the case and the patient does not have any obvious distant metastatic disease. We will let the multidisciplinary GI team determine the best course of treatment.

## 2018-11-21 ENCOUNTER — Other Ambulatory Visit: Payer: Self-pay | Admitting: Radiation Oncology

## 2018-11-21 ENCOUNTER — Inpatient Hospital Stay (HOSPITAL_COMMUNITY): Payer: Medicare Other

## 2018-11-21 DIAGNOSIS — E440 Moderate protein-calorie malnutrition: Secondary | ICD-10-CM

## 2018-11-21 LAB — GLUCOSE, CAPILLARY
Glucose-Capillary: 96 mg/dL (ref 70–99)
Glucose-Capillary: 119 mg/dL — ABNORMAL HIGH (ref 70–99)
Glucose-Capillary: 181 mg/dL — ABNORMAL HIGH (ref 70–99)
Glucose-Capillary: 77 mg/dL (ref 70–99)

## 2018-11-21 LAB — BASIC METABOLIC PANEL
Anion gap: 10 (ref 5–15)
BUN: 15 mg/dL (ref 8–23)
CO2: 26 mmol/L (ref 22–32)
Calcium: 8.3 mg/dL — ABNORMAL LOW (ref 8.9–10.3)
Chloride: 101 mmol/L (ref 98–111)
Creatinine, Ser: 1.75 mg/dL — ABNORMAL HIGH (ref 0.61–1.24)
GFR calc Af Amer: 45 mL/min — ABNORMAL LOW (ref >60)
GFR calc non Af Amer: 39 mL/min — ABNORMAL LOW (ref >60)
GLUCOSE: 97 mg/dL (ref 70–99)
Potassium: 3.3 mmol/L — ABNORMAL LOW (ref 3.5–5.1)
Sodium: 137 mmol/L (ref 135–145)

## 2018-11-21 LAB — CBC
HEMATOCRIT: 23.5 % — AB (ref 39.0–52.0)
HEMOGLOBIN: 7.7 g/dL — AB (ref 13.0–17.0)
MCH: 30.2 pg (ref 26.0–34.0)
MCHC: 32.8 g/dL (ref 30.0–36.0)
MCV: 92.2 fL (ref 80.0–100.0)
Platelets: 238 10*3/uL (ref 150–400)
RBC: 2.55 MIL/uL — ABNORMAL LOW (ref 4.22–5.81)
RDW: 14.8 % (ref 11.5–15.5)
WBC: 9.3 10*3/uL (ref 4.0–10.5)
nRBC: 0 % (ref 0.0–0.2)

## 2018-11-21 LAB — HIV ANTIBODY (ROUTINE TESTING W REFLEX): HIV Screen 4th Generation wRfx: NONREACTIVE

## 2018-11-21 MED ORDER — SODIUM CHLORIDE 0.9% FLUSH
10.0000 mL | Freq: Two times a day (BID) | INTRAVENOUS | Status: DC
  Administered 2018-11-21 – 2018-11-22 (×2): 10 mL
  Administered 2018-11-23: 20 mL
  Administered 2018-11-23 – 2018-11-24 (×2): 10 mL

## 2018-11-21 MED ORDER — SODIUM CHLORIDE 0.9% FLUSH
10.0000 mL | INTRAVENOUS | Status: DC | PRN
  Administered 2018-11-24: 10 mL
  Filled 2018-11-21: qty 40

## 2018-11-21 MED ORDER — POTASSIUM CHLORIDE 10 MEQ/100ML IV SOLN
10.0000 meq | INTRAVENOUS | Status: AC
  Administered 2018-11-21 (×6): 10 meq via INTRAVENOUS
  Filled 2018-11-21 (×6): qty 100

## 2018-11-21 MED ORDER — CEFAZOLIN SODIUM-DEXTROSE 2-4 GM/100ML-% IV SOLN
2.0000 g | INTRAVENOUS | Status: AC
  Administered 2018-11-22: 2 g via INTRAVENOUS
  Filled 2018-11-21: qty 100

## 2018-11-21 MED ORDER — IOHEXOL 300 MG/ML  SOLN
150.0000 mL | Freq: Once | INTRAMUSCULAR | Status: AC | PRN
  Administered 2018-11-21: 50 mL via ORAL

## 2018-11-21 MED ORDER — BOOST / RESOURCE BREEZE PO LIQD CUSTOM
1.0000 | Freq: Three times a day (TID) | ORAL | Status: DC
  Administered 2018-11-21 – 2018-11-24 (×6): 1 via ORAL

## 2018-11-21 MED ORDER — POTASSIUM CHLORIDE IN NACL 40-0.9 MEQ/L-% IV SOLN
INTRAVENOUS | Status: DC
  Administered 2018-11-21 – 2018-11-22 (×2): 50 mL/h via INTRAVENOUS
  Filled 2018-11-21: qty 1000

## 2018-11-21 NOTE — Progress Notes (Signed)
PROGRESS NOTE   Lance Herring  JWJ:[id number]    DOB: [birth year]    DOA: 11/19/2018  PCP: No primary care provider on file.   I have briefly reviewed patients previous medical records in [facility].  Brief Narrative:  73 year old married male, retired Engineer, structural and worked for the [employer], has 2 houses and lived between [place] and [place], [place] of DM 2, HTN, stage III CKD, esophageal cancer diagnosed in 2018 and completed chemoradiation in [place] in June 2019, oncologist in [place], initially presented to [place] ED on evening of 1/23 due to syncope x2 and 2 days history of melena.  Hemoglobin 6.5, potassium 2.5, initially transferred from [facility] to [facility] where he received 3 units PRBC transfusion, potassium replaced, underwent EGD for UGIB but unfortunately unable to get the scope past the GE junction due to tumor.  He was then transferred to [facility] for CVTS consultation for consideration for surgery and GI consultation for consideration for esophageal stent.  Oncology consulted 1/26.  IR consulted for G-tube placement.   Assessment & Plan:   Principal Problem:   UGIB (upper gastrointestinal bleed) Active Problems:   Acute blood loss anemia   Esophageal cancer (HCC)   DM2 (diabetes mellitus, type 2) (HCC)   HTN (hypertension)   CKD (chronic kidney disease) stage 3, GFR 30-59 ml/min (HCC)   Acute upper GI bleed - Most likely due to friable esophageal cancer - EGD attempted at OSH but unable to get the scope past the GE junction due to tumor.  However as per oncology input, underwent biopsies and they are following up on pathology. - As per GI input here, the mass was friable and there is no maneuver that can be performed to arrest any bleeding.  Also patient able to tolerate liquids, hence not end-stage, not completely obstructed and so no reason to place an esophageal stent. - GI bleeding seems to have stopped for now.  Remains at risk for recurrent  bleeding. - Continue PPI for now. -Oncology input appreciated.  I discussed with Dr. Lindi Adie who plans to discuss with his radiation oncology colleagues regarding role of palliative radiation for the bleed.  Their GI coordinator also attempting to obtain patient's medical records from [place].  Acute blood loss anemia -Secondary to upper GI bleed. -Transfused 3 units of PRBC at outside hospital. -Initial hemoglobin at outside hospital was 6.5.  This improved to 8.9, down to 7.8.  No clinically active GI bleeding at this time.  Follow CBC in a.m. and transfuse if hemoglobin 7 or less.  Hemoglobin stable in the mid 7 g per DL range.  Syncope -Most likely due to symptomatic acute blood loss anemia. -Patient counseled that he should not drive for 6 months. -Mobilize as tolerated.  Consider PT if needed.  Acute on stage III chronic kidney disease -Baseline creatinine not known. -Presented with creatinine of 2.02.  Gradually improving, creatinine down to 1.75.  Continue IV fluids and follow BMP closely.  Hypokalemia -Potassium 2.8 on 1/25. -Better.  Continue to replace aggressively IV.  Magnesium normal.  Follow BMP closely.  Relapsed esophageal cancer -Patient states that he saw his oncologist in [place] on 11/15/2018 at which time he forgot to mention about his dysphagia issues.  He was supposed to follow-up in February with PET scan. -Patient and spouse indicate that he now wishes to continue his cancer care locally. -I consulted Dr. Lindi Adie and his input from 1/26 appreciated.  He recommended proceeding with G-tube placement for  nutritional needs, once stable can be discharged home for outpatient close follow-up at the cancer center for PET scan and further management.  They will address palliative care as outpatient. -Continue clear liquids.  IR consulted for G-tube placement and are working him up, underwent CT abdomen yesterday and awaiting barium swallow. -As per CVTS input, not candidate  for esophagectomy. -CT abdomen and chest without contrast from 1/26 appreciated: 10 mm pulmonary nodule left lung apex-isolated metastatic disease not excluded, and thickening of the distal esophagus likely consistent with known residual esophageal carcinoma, no high-grade esophageal obstruction or perforation, prior granulomatous disease of lungs. -As per oncology input, coordinating care.  Type II DM -Reasonable inpatient control.  Continue SSI.  Elevated troponin -Suspect demand ischemia from acute GI bleed and acute blood loss anemia. -Troponin peaked at 1.5, has trended down.  No chest pain or acute EKG changes. -Had been seen by cardiology at OSH and felt also to be due to demand ischemia. -Not a candidate for antiplatelets due to GI bleed.  Essential hypertension -Controlled.  Moderate protein calorie malnutrition -Dietitian consulted.  Constipation -Had ordered rectal Dulcolax yesterday but did not receive, discussed with RN to give today.  Continue oral Colace liquid.    DVT prophylaxis: SCD Code Status: Full Family Communication: Discussed in detail with patient's spouse at bedside, updated care and answered questions. Disposition: DC home pending clinical improvement and further evaluation and intervention as discussed above   Consultants:  CVTS Medical oncology  Procedures:  None  Antimicrobials:  None   Subjective: No BM for 3 to 4 days.  Passing flatus.  No abdominal pain.  Has not been out of bed.  No other complaints reported.  ROS: As above, otherwise negative.  Objective:  Vitals:   11/20/18 2000 11/20/18 2342 11/21/18 0000 11/21/18 0505  BP:  130/70  131/65  Pulse: 74 74 76 79  Resp: (!) 0 14 14 15   Temp:  99.4 F (37.4 C)  98.5 F (36.9 C)  TempSrc:  Oral  Oral  SpO2:  100%  99%  Weight:      Height:        Examination:  General exam: Pleasant elderly male, moderately built and poorly nourished lying comfortably propped up in bed.   Oral mucosa moist. Respiratory system: Clear to auscultation. Respiratory effort normal.  Right Port-A-Cath.  Stable. Cardiovascular system: S1 & S2 heard, RRR. No JVD, murmurs, rubs, gallops or clicks. No pedal edema.  Telemetry personally reviewed: Sinus rhythm. Gastrointestinal system: Abdomen is nondistended, soft and nontender. No organomegaly or masses felt. Normal bowel sounds heard.  Stable. Central nervous system: Alert and oriented. No focal neurological deficits.  Stable. Extremities: Symmetric 5 x 5 power. Skin: No rashes, lesions or ulcers Psychiatry: Judgement and insight appear normal. Mood & affect appropriate.     Data Reviewed: I have personally reviewed following labs and imaging studies  CBC: Recent Labs  Lab 11/19/18 0902 11/20/18 0533 11/21/18 0624  WBC 13.3* 10.3 9.3  HGB 8.9* 7.8* 7.7*  HCT 26.4* 22.8* 23.5*  MCV 89.2 90.5 92.2  PLT 227 216 048   Basic Metabolic Panel: Recent Labs  Lab 11/19/18 0902 11/19/18 1742 11/20/18 0533 11/20/18 0858 11/20/18 1626 11/21/18 0624  NA 142  --  139  --  138 137  K 2.6* 2.7* 2.8*  --  3.4* 3.3*  CL 104  --  100  --  102 101  CO2 31  --  29  --  27 26  GLUCOSE 122*  --  113*  --  133* 97  BUN 43*  --  27*  --  20 15  CREATININE 2.02*  --  1.88*  --  1.85* 1.75*  CALCIUM 8.6*  --  8.2*  --  8.6* 8.3*  MG  --   --   --  2.0  --   --    Liver Function Tests: Recent Labs  Lab 11/19/18 0902  AST 22  ALT 11  ALKPHOS 55  BILITOT 1.0  PROT 5.6*  ALBUMIN 2.7*   Cardiac Enzymes: Recent Labs  Lab 11/19/18 0902 11/19/18 1742  TROPONINI 0.80* 0.51*   HbA1C: No results for input(s): HGBA1C in the last 72 hours. CBG: Recent Labs  Lab 11/20/18 0745 11/20/18 1223 11/20/18 1641 11/20/18 2123 11/21/18 0736  GLUCAP 107* 116* 130* 123* 96    Recent Results (from the past 240 hour(s))  MRSA PCR Screening     Status: None   Collection Time: 11/19/18  3:20 AM  Result Value Ref Range Status   MRSA by PCR  NEGATIVE NEGATIVE Final    Comment:        The GeneXpert MRSA Assay (FDA approved for NASAL specimens only), is one component of a comprehensive MRSA colonization surveillance program. It is not intended to diagnose MRSA infection nor to guide or monitor treatment for MRSA infections. Performed at [facility], [place] [address], [place], [place] [address]          Radiology Studies: Ct Abdomen Wo Contrast  Result Date: 11/20/2018 CLINICAL DATA:  History of carcinoma of the distal esophagus at the GE junction with prior documentation of high-grade obstruction and inability to advance endoscope through the level of obstruction. Evaluation is now performed for potential gastrostomy tube placement. EXAM: CT CHEST AND ABDOMEN WITHOUT CONTRAST TECHNIQUE: Multidetector CT imaging of the chest and abdomen was performed following the standard protocol without intravenous contrast. COMPARISON:  None. FINDINGS: CT CHEST FINDINGS WITHOUT CONTRAST Cardiovascular: The heart size is normal. No pericardial fluid. Normal caliber thoracic aorta and central pulmonary arteries. Port-A-Cath present via the right jugular vein with the catheter tip in the lower SVC. Mediastinum/Nodes: Calcified right paratracheal and right hilar lymph nodes present consistent with prior granulomatous disease. No enlarged lymph nodes identified. The proximal to mid esophagus is mildly dilated and contains air and fluid. Distal esophagus demonstrates thickening and measures approximately 2.5 cm in diameter. No enlarged paraesophageal lymph nodes are identified. No evidence of esophageal perforation. Lungs/Pleura: Noncalcified left apical lung nodule present in a subpleural location at the top of the left upper lobe measuring 9 mm in greatest transverse diameter on axial images and 10 mm in height on coronal reconstructions. Calcified anterior left upper lobe granuloma measures 5 mm. Additional 5 mm calcified granuloma is present in  the right middle lobe just below the minor fissure. There is some scarring and mild bronchiectasis in the medial aspect of the right lower lobe abutting the posterior mediastinum. Part of this appearance may relate to prior radiation therapy. Musculoskeletal: No bony lesions or fractures identified. CT ABDOMEN FINDINGS WITHOUT CONTRAST Hepatobiliary: No focal liver abnormality is seen. No gallstones, gallbladder wall thickening, or biliary dilatation. Pancreas: Unremarkable. No pancreatic ductal dilatation or surrounding inflammatory changes. Spleen: Normal in size without focal abnormality. Adrenals/Urinary Tract: No adrenal masses. Probable central parapelvic cyst of the right kidney measuring 3 cm as there does not appear to be dilatation of the collecting system elsewhere in the right kidney.  Stomach/Bowel: No hiatal hernia. The stomach is relatively vertical and decompressed at the time of imaging. The proximal to mid stomach is predominantly posterior to a high splenic flexure and distal transverse colon. There is no colonic interposition between the distal stomach and the abdominal wall. No evidence of bowel obstruction or free air. No abnormal fluid collections or ascites. Vascular/Lymphatic: Calcified plaque in the abdominal aorta without evidence of abdominal aortic aneurysm. No enlarged abdominal lymph nodes identified. Other: No hernias or free fluid. Musculoskeletal: No bony lesions or fractures identified. IMPRESSION: 1. 10 mm pulmonary nodule at the left lung apex. Correlation with any prior outside imaging is recommended to determine whether this is a stable finding. Isolated metastatic disease cannot be excluded. 2. Thickening of the distal esophagus likely consistent with known residual esophageal carcinoma. No evidence of high-grade esophageal obstruction currently or perforation. No prominent paraesophageal lymph nodes are identified in the chest. 3. Evidence of prior granulomatous disease with 2  separate right lung calcified granulomas and right paratracheal and hilar calcified lymph nodes. 4. The proximal to mid stomach lies posterior to a high splenic flexure and distal transverse colon in the supine position when decompressed. There is no colon interposed between the distal stomach and the abdominal wall. If enough esophageal lumen is present to allow catheter passage for air insufflation of the stomach, there were likely will be a percutaneous window available for gastrostomy tube placement with adequate distention of the stomach. An esophogram has been recommended to evaluate caliber of the esophageal lumen and will be performed later. Electronically Signed   By: Aletta Edouard M.D.   On: 11/20/2018 13:45   Ct Chest Wo Contrast  Result Date: 11/20/2018 CLINICAL DATA:  History of carcinoma of the distal esophagus at the GE junction with prior documentation of high-grade obstruction and inability to advance endoscope through the level of obstruction. Evaluation is now performed for potential gastrostomy tube placement. EXAM: CT CHEST AND ABDOMEN WITHOUT CONTRAST TECHNIQUE: Multidetector CT imaging of the chest and abdomen was performed following the standard protocol without intravenous contrast. COMPARISON:  None. FINDINGS: CT CHEST FINDINGS WITHOUT CONTRAST Cardiovascular: The heart size is normal. No pericardial fluid. Normal caliber thoracic aorta and central pulmonary arteries. Port-A-Cath present via the right jugular vein with the catheter tip in the lower SVC. Mediastinum/Nodes: Calcified right paratracheal and right hilar lymph nodes present consistent with prior granulomatous disease. No enlarged lymph nodes identified. The proximal to mid esophagus is mildly dilated and contains air and fluid. Distal esophagus demonstrates thickening and measures approximately 2.5 cm in diameter. No enlarged paraesophageal lymph nodes are identified. No evidence of esophageal perforation. Lungs/Pleura:  Noncalcified left apical lung nodule present in a subpleural location at the top of the left upper lobe measuring 9 mm in greatest transverse diameter on axial images and 10 mm in height on coronal reconstructions. Calcified anterior left upper lobe granuloma measures 5 mm. Additional 5 mm calcified granuloma is present in the right middle lobe just below the minor fissure. There is some scarring and mild bronchiectasis in the medial aspect of the right lower lobe abutting the posterior mediastinum. Part of this appearance may relate to prior radiation therapy. Musculoskeletal: No bony lesions or fractures identified. CT ABDOMEN FINDINGS WITHOUT CONTRAST Hepatobiliary: No focal liver abnormality is seen. No gallstones, gallbladder wall thickening, or biliary dilatation. Pancreas: Unremarkable. No pancreatic ductal dilatation or surrounding inflammatory changes. Spleen: Normal in size without focal abnormality. Adrenals/Urinary Tract: No adrenal masses. Probable central parapelvic cyst of  the right kidney measuring 3 cm as there does not appear to be dilatation of the collecting system elsewhere in the right kidney. Stomach/Bowel: No hiatal hernia. The stomach is relatively vertical and decompressed at the time of imaging. The proximal to mid stomach is predominantly posterior to a high splenic flexure and distal transverse colon. There is no colonic interposition between the distal stomach and the abdominal wall. No evidence of bowel obstruction or free air. No abnormal fluid collections or ascites. Vascular/Lymphatic: Calcified plaque in the abdominal aorta without evidence of abdominal aortic aneurysm. No enlarged abdominal lymph nodes identified. Other: No hernias or free fluid. Musculoskeletal: No bony lesions or fractures identified. IMPRESSION: 1. 10 mm pulmonary nodule at the left lung apex. Correlation with any prior outside imaging is recommended to determine whether this is a stable finding. Isolated  metastatic disease cannot be excluded. 2. Thickening of the distal esophagus likely consistent with known residual esophageal carcinoma. No evidence of high-grade esophageal obstruction currently or perforation. No prominent paraesophageal lymph nodes are identified in the chest. 3. Evidence of prior granulomatous disease with 2 separate right lung calcified granulomas and right paratracheal and hilar calcified lymph nodes. 4. The proximal to mid stomach lies posterior to a high splenic flexure and distal transverse colon in the supine position when decompressed. There is no colon interposed between the distal stomach and the abdominal wall. If enough esophageal lumen is present to allow catheter passage for air insufflation of the stomach, there were likely will be a percutaneous window available for gastrostomy tube placement with adequate distention of the stomach. An esophogram has been recommended to evaluate caliber of the esophageal lumen and will be performed later. Electronically Signed   By: Aletta Edouard M.D.   On: 11/20/2018 13:45        Scheduled Meds: . bisacodyl  10 mg Rectal Once  . docusate  100 mg Oral BID  . insulin aspart  0-9 Units Subcutaneous TID WC  . pantoprazole (PROTONIX) IV  40 mg Intravenous Q12H   Continuous Infusions: . 0.9 % NaCl with KCl 40 mEq / L 50 mL/hr (11/21/18 0848)  . potassium chloride 10 mEq (11/21/18 0851)     LOS: 2 days     Vernell Leep, MD, FACP, [facility]. [facility]  To contact the attending provider between 7A-7P or the covering provider during after hours 7P-7A, please log into the web site [email or web address] and access using universal [place] password for that web site. If you do not have the password, please call the hospital operator.  11/21/2018, 8:54 AM

## 2018-11-21 NOTE — Progress Notes (Signed)
Initial Nutrition Assessment  DOCUMENTATION CODES:   Non-severe (moderate) malnutrition in context of chronic illness  INTERVENTION:   - Boost Breeze po TID, each supplement provides 250 kcal and 9 grams of protein  Once G-tube placed and cleared for use, recommend initiation of bolus TF regimen: - Provide Jevity 1.2 cal 100 ml for first bolus and increase by 50 ml at each subsequent feeding until goal volume of 474 ml (2 cans) is reached  Goal bolus TF regimen: - Jevity 1.2 cal formula 474 ml (2 cans) QID - Free water 100 ml QID  Recommended goal bolus tube feeding regimen provides 2275 kcal, 105 grams of protein, and 1936 ml of H2O (103% of kcal needs, 100% of protein needs).  NUTRITION DIAGNOSIS:   Moderate Malnutrition related to chronic illness (recurrent esophageal cancer) as evidenced by moderate fat depletion, moderate muscle depletion, severe muscle depletion.  GOAL:   Patient will meet greater than or equal to 90% of their needs  MONITOR:   PO intake, Supplement acceptance, Diet advancement, Weight trends, Labs  REASON FOR ASSESSMENT:   Consult Assessment of nutrition requirement/status  ASSESSMENT:   73 year old male with PMH significant for T2DM, HTN, CKD stage 3, esophageal cancer s/p chemoradiation completed in June 2019. Pt had a PEG in the past. Pt presented with bleeding and an EGD was performed at [facility] but unable to pass scope through GE junction due to mass. Pt noted to have recurrent esophageal cancer.  Per surgery notes, pt not an operative candidate for esophagectomy. IR consulted for G-tube placement. Pt underwent barium swallow today. Pt is now scheduled for percutaneous G-tube placement.  Discussed pt with RN who reports pt may have Cortrak placed in IR. RD reported concern with this plan to MD d/t pt with friable esophageal mass. MD confirmed that pt will NOT receive Cortrak and awaits G-tube at this time.  Spoke with pt and wife at bedside. Pt  reports that his PO intake has decreased over the last [redacted] weeks along with his weight.  Pt states that he has been tolerating clear liquids well and denies N/V. Pt's wife reports that pt consumed 100% of chicken broth, 25% of jello, and 100% of grape juice from breakfast tray this morning.  Pt reports that when he is feeling well, he eats 2 "full meals" daily. Pt states that lately, he has been eating 1-2 meals daily. Pt reports that he only eats "soft foods" due to increased difficulty swallowing. A meal might include soup with mashed potatoes or meatloaf or chicken pot pie. Pt denies any issues chewing but states that when he does not "burp" while eating, he experiences emesis. Pt states that when he does burp while eating, he does not experience emesis. Pt states that his emesis consists of phlegm and not food. Pt reports experiencing emesis 3-4 times weekly.  Pt shares that he has lost weight progressively over the last 2 years. Pt states that 2 years ago, he weighed 180 lbs. Pt reports that after his first cancer treatments, he was maintaining his weight between 162-164 lbs but is unsure exactly when he last weighed this. Pt believes that he now weighs "around 155." Noted measured weight on 1/25 was 147.93 lbs. No weight history in chart PTA.  Medications reviewed and include: SSI, Colace, Protonix, KCl 10 mEq x 6 runs IVF: NS with CL 40 mEq @ 50 ml/hr  Labs reviewed: potassium 3.3 (L), hemoglobin 7.7 (L) CBG's: 96, 123, 130, 116 x 24 hours  NUTRITION - FOCUSED PHYSICAL EXAM:    Most Recent Value  Orbital Region  Moderate depletion  Upper Arm Region  Moderate depletion  Thoracic and Lumbar Region  Moderate depletion  Buccal Region  Moderate depletion  Temple Region  Moderate depletion  Clavicle Bone Region  Severe depletion  Clavicle and Acromion Bone Region  Severe depletion  Scapular Bone Region  Severe depletion  Dorsal Hand  Moderate depletion  Patellar Region  Moderate depletion   Anterior Thigh Region  Moderate depletion  Posterior Calf Region  Moderate depletion  Edema (RD Assessment)  None  Hair  Reviewed  Eyes  Reviewed  Mouth  Reviewed  Skin  Reviewed  Nails  Reviewed       Diet Order:   Diet Order            Diet NPO time specified Except for: Sips with Meds  Diet effective midnight        Diet clear liquid Room service appropriate? Yes; Fluid consistency: Thin  Diet effective now              EDUCATION NEEDS:   Education needs have been addressed  Skin:  Skin Assessment: Reviewed RN Assessment  Last BM:  1/25  Height:   Ht Readings from Last 1 Encounters:  11/19/18 5\' 11"  (1.803 m)    Weight:   Wt Readings from Last 1 Encounters:  11/19/18 67.1 kg    Ideal Body Weight:  78.2 kg  BMI:  Body mass index is 20.63 kg/m.  Estimated Nutritional Needs:   Kcal:  2000-2200  Protein:  95-110 grams  Fluid:  >/= 2.0 L    Gaynell Face, MS, RD, LDN Inpatient Clinical Dietitian Pager: [phone] Weekend/After Hours: [phone]

## 2018-11-21 NOTE — Care Management Note (Addendum)
Case Management Note  Patient Details  Name: Lance Herring MRN: [id number] Date of Birth: July 01, 1946  Subjective/Objective:    Pt transferred from [facility] for GI esophageal stent consideration             Action/Plan:   PTA independent from home with wife.  Pts wife informed CM that pt has PCP and denied barriers with paying for medications as prescribed.   Pt scheduled for Peg tube placement 11/22/18   Expected Discharge Date:  11/23/18               Expected Discharge Plan:  [place]  In-House Referral:     Discharge planning Services  CM Consult  Post Acute Care Choice:    Choice offered to:     DME Arranged:    DME Agency:     HH Arranged:    HH Agency:     Status of Service:  In process, will continue to follow  If discussed at Long Length of Stay Meetings, dates discussed:    Additional Comments: 11/21/2018 Pt had previous peg tube removed during the summer months of 2019.  Wife can not recall the supplier of the equipment nor the [facility] agency - wife informed CM that [facility] and DME was arranged while stay in [facility].  Wife declined for CM to contact PCP (Dr Marvis Repress and requested CM to arrange [facility] with a new company from [employer].gov list.  Wife chose [facility] for peg tub supplies and feeds. CM provided medicare.gov list.  Pts address at discharge will be [place], [place].    Pt chose [facility] for [facility] - agency contacted and tentative referral accepted pending orders   Pts wife chose [facility] for tube feed and supplies - agency accepted referral..  Attending will write tube feed orders once tube feeds have been tolerated.  CM requested attending to write [place] orders.   -Maryclare Labrador, RN 11/21/2018, 3:33 PM

## 2018-11-21 NOTE — Progress Notes (Signed)
I called and spoke with Dr. Marda Stalker at [facility] [place] location [phone]. I've also spoken with Dr. Ivory Broad nurse in [facility] [phone]. 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 He was diagnosed in April 2018 after episodes of dysphagia. Workup revealed regional adenopathy. He was a candidate for surgical resection and met with a Psychologist, sport and exercise at [facility], but declined surgery. He had subsequent PEG tube, and received ChemoRT which he completed in August 2018. He was NED on EGD in October 2018, and biopsies confirmed no residual disease. He had recent PET scan with Dr. Marda Stalker in September 2019 that was negative for disease. He was seen on 11/15/2018 with Dr. Marda Stalker and at that time had an Hgb of 10.8, and CEA of 3.4, previously 3.9. The patient presented to [facility] after syncope and had a hgb of 6.5. He received 3 units PRBCs and was transferred to [facility] to [place]. He had an endoscopy and biopsies are pending of a friable lesion. He has had CT work up which revealed a LUL nodule, and 2.5 cm diameter thickened esophagus containing air and fluid. He  is not a candidate for esophagectomy. We've been asked to see if he could receive additional radiotherapy. I've requested his records from Dr. Tawny Asal. We will await biopsy and Dr. Lisbeth Renshaw will need to review his records.    Carola Rhine, PAC

## 2018-11-21 NOTE — Consult Note (Signed)
Chief Complaint: Patient was seen in consultation today for percutaneous gastric tube placement at the request of Dr Jennette Kettle  Referring Physician(s): Dr Tera Partridge  Supervising Physician: Arne Cleveland  Patient Status: [facility] - In-pt  History of Present Illness: Lance Herring is a 73 y.o. male   Esophageal cancer Dx 01/2017 Dysphagia-- Pt has had G tube in past - removed maybe 06/2017 PET 06/2018 - all clear per pt  Sept 2019 noted syncope and drop in hg 3 units blood Endo done in [place]; Bx results pending New LUL lesion Esophageal thickening  Dr Benson Norway and Dr Servando Snare were consulted Not candidate for esophageal stent per Dr Benson Norway Dr Servando Snare needs biopsy results for staging prior to making recommendations re Lung nodule CT shows no distant metastatic dz  Again with dysphagia Malnutrition Wt loss Request made for percutaneous gastric tube placement  Imaging reviewed with Dr Valentina Shaggy procedure'   Past Medical History:  Diagnosis Date  . CKD (chronic kidney disease) stage 3, GFR 30-59 ml/min (HCC)   . DM2 (diabetes mellitus, type 2) ([place])   . Esophageal cancer ([place])   . Gout   . HTN (hypertension)     Past Surgical History:  Procedure Laterality Date  . ORCHIECTOMY Right    Benign    Allergies: Patient has no known allergies.  Medications: Prior to Admission medications   Medication Sig Start Date End Date Taking? Authorizing Provider  allopurinol (ZYLOPRIM) 100 MG tablet Take 100 mg by mouth daily.   Yes [provider]  amLODipine (NORVASC) 5 MG tablet Take 5 mg by mouth daily.   Yes [provider]  chlorthalidone (HYGROTON) 25 MG tablet Take 25 mg by mouth daily.   Yes [provider]  glyBURIDE-metformin (GLUCOVANCE) 5-500 MG tablet Take 1 tablet by mouth 2 (two) times daily.   Yes [provider]  labetalol (NORMODYNE) 100 MG tablet Take 100 mg by mouth 2 (two) times daily.   Yes [provider]  pioglitazone (ACTOS) 45 MG tablet Take 45 mg by mouth daily.   Yes [provider]     History reviewed. No pertinent family history.  Social History   Socioeconomic History  . Marital status: Married    Spouse name: Altha Harm  . Number of children: Not on file  . Years of education: 53  . Highest education level: Not on file  Occupational History  . Occupation: Retired Publishing copy, [employer]  Social Needs  . Financial resource strain: Not very hard  . Food insecurity:    Worry: Never true    Inability: Never true  . Transportation needs:    Medical: No    Non-medical: No  Tobacco Use  . Smoking status: Former Smoker    Types: Cigarettes    Last attempt to quit: 1998    Years since quitting: 22.0  . Smokeless tobacco: Never Used  Substance and Sexual Activity  . Alcohol use: Never    Frequency: Never  . Drug use: Never  . Sexual activity: Not Currently    Partners: Female  Lifestyle  . Physical activity:    Days per week: 0 days    Minutes per session: 0 min  . Stress: Not at all  Relationships  . Social connections:    Talks on phone: Three times a week    Gets together: Three times a week    Attends religious service: More than 4 times per year  Active member of club or organization: No    Attends meetings of clubs or organizations: Never    Relationship status: Married  Other Topics Concern  . Not on file  Social History Narrative  . Not on file     Review of Systems: A 12 point ROS discussed and pertinent positives are indicated in the HPI above.  All other systems are negative.  Review of Systems  Constitutional: Positive for activity change, appetite change and unexpected weight change. Negative for fever.  HENT: Positive for sore throat.   Respiratory: Positive for cough.   Gastrointestinal: Negative for abdominal pain.  Neurological: Positive for weakness.  Psychiatric/Behavioral: Negative for  behavioral problems and confusion.    Vital Signs: BP 133/71 (BP Location: Right Arm)   Pulse 67   Temp 98.7 F (37.1 C) (Oral)   Resp 13   Ht 5\' 11"  (1.803 m)   Wt 147 lb 14.9 oz (67.1 kg)   SpO2 99%   BMI 20.63 kg/m   Physical Exam Vitals signs reviewed.  Cardiovascular:     Rate and Rhythm: Normal rate and regular rhythm.     Heart sounds: Normal heart sounds.  Pulmonary:     Effort: Pulmonary effort is normal.     Breath sounds: Normal breath sounds.  Abdominal:     General: Bowel sounds are normal.  Musculoskeletal: Normal range of motion.  Skin:    General: Skin is warm and dry.  Neurological:     General: No focal deficit present.     Mental Status: He is alert and oriented to person, place, and time.  Psychiatric:        Mood and Affect: Mood normal.        Behavior: Behavior normal.        Thought Content: Thought content normal.        Judgment: Judgment normal.     Imaging: Ct Abdomen Wo Contrast  Result Date: 11/20/2018 CLINICAL DATA:  History of carcinoma of the distal esophagus at the GE junction with prior documentation of high-grade obstruction and inability to advance endoscope through the level of obstruction. Evaluation is now performed for potential gastrostomy tube placement. EXAM: CT CHEST AND ABDOMEN WITHOUT CONTRAST TECHNIQUE: Multidetector CT imaging of the chest and abdomen was performed following the standard protocol without intravenous contrast. COMPARISON:  None. FINDINGS: CT CHEST FINDINGS WITHOUT CONTRAST Cardiovascular: The heart size is normal. No pericardial fluid. Normal caliber thoracic aorta and central pulmonary arteries. Port-A-Cath present via the right jugular vein with the catheter tip in the lower SVC. Mediastinum/Nodes: Calcified right paratracheal and right hilar lymph nodes present consistent with prior granulomatous disease. No enlarged lymph nodes identified. The proximal to mid esophagus is mildly dilated and contains air and  fluid. Distal esophagus demonstrates thickening and measures approximately 2.5 cm in diameter. No enlarged paraesophageal lymph nodes are identified. No evidence of esophageal perforation. Lungs/Pleura: Noncalcified left apical lung nodule present in a subpleural location at the top of the left upper lobe measuring 9 mm in greatest transverse diameter on axial images and 10 mm in height on coronal reconstructions. Calcified anterior left upper lobe granuloma measures 5 mm. Additional 5 mm calcified granuloma is present in the right middle lobe just below the minor fissure. There is some scarring and mild bronchiectasis in the medial aspect of the right lower lobe abutting the posterior mediastinum. Part of this appearance may relate to prior radiation therapy. Musculoskeletal: No bony lesions or fractures identified. CT ABDOMEN  FINDINGS WITHOUT CONTRAST Hepatobiliary: No focal liver abnormality is seen. No gallstones, gallbladder wall thickening, or biliary dilatation. Pancreas: Unremarkable. No pancreatic ductal dilatation or surrounding inflammatory changes. Spleen: Normal in size without focal abnormality. Adrenals/Urinary Tract: No adrenal masses. Probable central parapelvic cyst of the right kidney measuring 3 cm as there does not appear to be dilatation of the collecting system elsewhere in the right kidney. Stomach/Bowel: No hiatal hernia. The stomach is relatively vertical and decompressed at the time of imaging. The proximal to mid stomach is predominantly posterior to a high splenic flexure and distal transverse colon. There is no colonic interposition between the distal stomach and the abdominal wall. No evidence of bowel obstruction or free air. No abnormal fluid collections or ascites. Vascular/Lymphatic: Calcified plaque in the abdominal aorta without evidence of abdominal aortic aneurysm. No enlarged abdominal lymph nodes identified. Other: No hernias or free fluid. Musculoskeletal: No bony lesions or  fractures identified. IMPRESSION: 1. 10 mm pulmonary nodule at the left lung apex. Correlation with any prior outside imaging is recommended to determine whether this is a stable finding. Isolated metastatic disease cannot be excluded. 2. Thickening of the distal esophagus likely consistent with known residual esophageal carcinoma. No evidence of high-grade esophageal obstruction currently or perforation. No prominent paraesophageal lymph nodes are identified in the chest. 3. Evidence of prior granulomatous disease with 2 separate right lung calcified granulomas and right paratracheal and hilar calcified lymph nodes. 4. The proximal to mid stomach lies posterior to a high splenic flexure and distal transverse colon in the supine position when decompressed. There is no colon interposed between the distal stomach and the abdominal wall. If enough esophageal lumen is present to allow catheter passage for air insufflation of the stomach, there were likely will be a percutaneous window available for gastrostomy tube placement with adequate distention of the stomach. An esophogram has been recommended to evaluate caliber of the esophageal lumen and will be performed later. Electronically Signed   By: Aletta Edouard M.D.   On: 11/20/2018 13:45   Ct Chest Wo Contrast  Result Date: 11/20/2018 CLINICAL DATA:  History of carcinoma of the distal esophagus at the GE junction with prior documentation of high-grade obstruction and inability to advance endoscope through the level of obstruction. Evaluation is now performed for potential gastrostomy tube placement. EXAM: CT CHEST AND ABDOMEN WITHOUT CONTRAST TECHNIQUE: Multidetector CT imaging of the chest and abdomen was performed following the standard protocol without intravenous contrast. COMPARISON:  None. FINDINGS: CT CHEST FINDINGS WITHOUT CONTRAST Cardiovascular: The heart size is normal. No pericardial fluid. Normal caliber thoracic aorta and central pulmonary  arteries. Port-A-Cath present via the right jugular vein with the catheter tip in the lower SVC. Mediastinum/Nodes: Calcified right paratracheal and right hilar lymph nodes present consistent with prior granulomatous disease. No enlarged lymph nodes identified. The proximal to mid esophagus is mildly dilated and contains air and fluid. Distal esophagus demonstrates thickening and measures approximately 2.5 cm in diameter. No enlarged paraesophageal lymph nodes are identified. No evidence of esophageal perforation. Lungs/Pleura: Noncalcified left apical lung nodule present in a subpleural location at the top of the left upper lobe measuring 9 mm in greatest transverse diameter on axial images and 10 mm in height on coronal reconstructions. Calcified anterior left upper lobe granuloma measures 5 mm. Additional 5 mm calcified granuloma is present in the right middle lobe just below the minor fissure. There is some scarring and mild bronchiectasis in the medial aspect of the right lower lobe  abutting the posterior mediastinum. Part of this appearance may relate to prior radiation therapy. Musculoskeletal: No bony lesions or fractures identified. CT ABDOMEN FINDINGS WITHOUT CONTRAST Hepatobiliary: No focal liver abnormality is seen. No gallstones, gallbladder wall thickening, or biliary dilatation. Pancreas: Unremarkable. No pancreatic ductal dilatation or surrounding inflammatory changes. Spleen: Normal in size without focal abnormality. Adrenals/Urinary Tract: No adrenal masses. Probable central parapelvic cyst of the right kidney measuring 3 cm as there does not appear to be dilatation of the collecting system elsewhere in the right kidney. Stomach/Bowel: No hiatal hernia. The stomach is relatively vertical and decompressed at the time of imaging. The proximal to mid stomach is predominantly posterior to a high splenic flexure and distal transverse colon. There is no colonic interposition between the distal stomach  and the abdominal wall. No evidence of bowel obstruction or free air. No abnormal fluid collections or ascites. Vascular/Lymphatic: Calcified plaque in the abdominal aorta without evidence of abdominal aortic aneurysm. No enlarged abdominal lymph nodes identified. Other: No hernias or free fluid. Musculoskeletal: No bony lesions or fractures identified. IMPRESSION: 1. 10 mm pulmonary nodule at the left lung apex. Correlation with any prior outside imaging is recommended to determine whether this is a stable finding. Isolated metastatic disease cannot be excluded. 2. Thickening of the distal esophagus likely consistent with known residual esophageal carcinoma. No evidence of high-grade esophageal obstruction currently or perforation. No prominent paraesophageal lymph nodes are identified in the chest. 3. Evidence of prior granulomatous disease with 2 separate right lung calcified granulomas and right paratracheal and hilar calcified lymph nodes. 4. The proximal to mid stomach lies posterior to a high splenic flexure and distal transverse colon in the supine position when decompressed. There is no colon interposed between the distal stomach and the abdominal wall. If enough esophageal lumen is present to allow catheter passage for air insufflation of the stomach, there were likely will be a percutaneous window available for gastrostomy tube placement with adequate distention of the stomach. An esophogram has been recommended to evaluate caliber of the esophageal lumen and will be performed later. Electronically Signed   By: Aletta Edouard M.D.   On: 11/20/2018 13:45   Dg Esophagus W Single Cm (sol Or Thin Ba)  Result Date: 11/21/2018 CLINICAL DATA:  Esophageal cancer, pre PEG tube placement anatomical evaluation. EXAM: ESOPHOGRAM/BARIUM SWALLOW TECHNIQUE: Single contrast examination was performed using 50 cc Omnipaque 300. FLUOROSCOPY TIME:  Fluoroscopy Time:  1 minutes 12 seconds Radiation Exposure Index (if  provided by the fluoroscopic device): Number of Acquired Spot Images: 4 COMPARISON:  CT chest 11/20/2018. FINDINGS: A limited examination was performed in the reclined LPO position. The distal esophagus is dilated. A long segment narrow stricture is seen at the gastroesophageal junction with trickling of contrast into the proximal stomach. IMPRESSION: Long segment high-grade stricture at the gastroesophageal junction, in this patient with known esophageal cancer. Electronically Signed   By: Lorin Picket M.D.   On: 11/21/2018 09:34    Labs:  CBC: Recent Labs    11/19/18 0902 11/20/18 0533 11/21/18 0624  WBC 13.3* 10.3 9.3  HGB 8.9* 7.8* 7.7*  HCT 26.4* 22.8* 23.5*  PLT 227 216 238    COAGS: No results for input(s): INR, APTT in the last 8760 hours.  BMP: Recent Labs    11/19/18 0902 11/19/18 1742 11/20/18 0533 11/20/18 1626 11/21/18 0624  NA 142  --  139 138 137  K 2.6* 2.7* 2.8* 3.4* 3.3*  CL 104  --  100  102 101  CO2 31  --  29 27 26   GLUCOSE 122*  --  113* 133* 97  BUN 43*  --  27* 20 15  CALCIUM 8.6*  --  8.2* 8.6* 8.3*  CREATININE 2.02*  --  1.88* 1.85* 1.75*  GFRNONAA 32*  --  35* 36* 39*  GFRAA 38*  --  41* 42* 45*    LIVER FUNCTION TESTS: Recent Labs    11/19/18 0902  BILITOT 1.0  AST 22  ALT 11  ALKPHOS 55  PROT 5.6*  ALBUMIN 2.7*    TUMOR MARKERS: No results for input(s): AFPTM, CEA, CA199, CHROMGRNA in the last 8760 hours.  Assessment and Plan:  Dysphagia Wt loss Esophageal cancer Scheduled for percutaneous gastric tube placement Risks and benefits discussed with the patient including, but not limited to the need for a barium enema during the procedure, bleeding, infection, peritonitis, or damage to adjacent structures.  All of the patient's questions were answered, patient is agreeable to proceed. Consent signed and in chart.  Thank you for this interesting consult.  I greatly enjoyed meeting Lance Herring and look forward to  participating in their care.  A copy of this report was sent to the requesting provider on this date.  Electronically Signed: Lavonia Drafts, PA-C 11/21/2018, 12:26 PM   I spent a total of 40 Minutes    in face to face in clinical consultation, greater than 50% of which was counseling/coordinating care for percutaneous gastric tube placement

## 2018-11-22 ENCOUNTER — Encounter (HOSPITAL_COMMUNITY): Payer: Self-pay | Admitting: Interventional Radiology

## 2018-11-22 ENCOUNTER — Inpatient Hospital Stay (HOSPITAL_COMMUNITY): Payer: Medicare Other

## 2018-11-22 HISTORY — PX: IR GASTROSTOMY TUBE MOD SED: IMG625

## 2018-11-22 LAB — BASIC METABOLIC PANEL
Anion gap: 8 (ref 5–15)
BUN: 10 mg/dL (ref 8–23)
CALCIUM: 8.1 mg/dL — AB (ref 8.9–10.3)
CO2: 26 mmol/L (ref 22–32)
CREATININE: 1.62 mg/dL — AB (ref 0.61–1.24)
Chloride: 105 mmol/L (ref 98–111)
GFR calc Af Amer: 49 mL/min — ABNORMAL LOW (ref >60)
GFR calc non Af Amer: 42 mL/min — ABNORMAL LOW (ref >60)
Glucose, Bld: 94 mg/dL (ref 70–99)
Potassium: 3.7 mmol/L (ref 3.5–5.1)
Sodium: 139 mmol/L (ref 135–145)

## 2018-11-22 LAB — CBC
HCT: 23.3 % — ABNORMAL LOW (ref 39.0–52.0)
HCT: 29.9 % — ABNORMAL LOW (ref 39.0–52.0)
Hemoglobin: 7.5 g/dL — ABNORMAL LOW (ref 13.0–17.0)
Hemoglobin: 9.8 g/dL — ABNORMAL LOW (ref 13.0–17.0)
MCH: 29.9 pg (ref 26.0–34.0)
MCH: 29.2 pg (ref 26.0–34.0)
MCHC: 32.2 g/dL (ref 30.0–36.0)
MCHC: 32.8 g/dL (ref 30.0–36.0)
MCV: 92.8 fL (ref 80.0–100.0)
MCV: 89 fL (ref 80.0–100.0)
Platelets: 254 10*3/uL (ref 150–400)
Platelets: 303 10*3/uL (ref 150–400)
RBC: 2.51 MIL/uL — ABNORMAL LOW (ref 4.22–5.81)
RBC: 3.36 MIL/uL — ABNORMAL LOW (ref 4.22–5.81)
RDW: 15.1 % (ref 11.5–15.5)
RDW: 17.6 % — ABNORMAL HIGH (ref 11.5–15.5)
WBC: 8.5 10*3/uL (ref 4.0–10.5)
WBC: 13.4 10*3/uL — ABNORMAL HIGH (ref 4.0–10.5)
nRBC: 0 % (ref 0.0–0.2)
nRBC: 0 % (ref 0.0–0.2)

## 2018-11-22 LAB — GLUCOSE, CAPILLARY
Glucose-Capillary: 91 mg/dL (ref 70–99)
Glucose-Capillary: 85 mg/dL (ref 70–99)
Glucose-Capillary: 88 mg/dL (ref 70–99)
Glucose-Capillary: 124 mg/dL — ABNORMAL HIGH (ref 70–99)
Glucose-Capillary: 82 mg/dL (ref 70–99)

## 2018-11-22 LAB — PROTIME-INR
INR: 1.05
Prothrombin Time: 13.6 seconds (ref 11.4–15.2)

## 2018-11-22 LAB — PREPARE RBC (CROSSMATCH)

## 2018-11-22 MED ORDER — CEFAZOLIN SODIUM-DEXTROSE 2-4 GM/100ML-% IV SOLN
INTRAVENOUS | Status: AC
  Administered 2018-11-22: 2 g via INTRAVENOUS
  Filled 2018-11-22: qty 100

## 2018-11-22 MED ORDER — LIDOCAINE HCL 1 % IJ SOLN
INTRAMUSCULAR | Status: AC
  Filled 2018-11-22: qty 20

## 2018-11-22 MED ORDER — MIDAZOLAM HCL 2 MG/2ML IJ SOLN
INTRAMUSCULAR | Status: AC
  Filled 2018-11-22: qty 2

## 2018-11-22 MED ORDER — MIDAZOLAM HCL 2 MG/2ML IJ SOLN
INTRAMUSCULAR | Status: AC | PRN
  Administered 2018-11-22: 0.5 mg via INTRAVENOUS
  Administered 2018-11-22: 1 mg via INTRAVENOUS

## 2018-11-22 MED ORDER — GLUCAGON HCL RDNA (DIAGNOSTIC) 1 MG IJ SOLR
INTRAMUSCULAR | Status: AC | PRN
  Administered 2018-11-22: 1 mg via INTRAVENOUS

## 2018-11-22 MED ORDER — IOPAMIDOL (ISOVUE-300) INJECTION 61%
INTRAVENOUS | Status: AC
  Filled 2018-11-22: qty 50

## 2018-11-22 MED ORDER — GLUCAGON HCL RDNA (DIAGNOSTIC) 1 MG IJ SOLR
INTRAMUSCULAR | Status: AC
  Filled 2018-11-22: qty 1

## 2018-11-22 MED ORDER — LIDOCAINE HCL 1 % IJ SOLN
INTRAMUSCULAR | Status: AC | PRN
  Administered 2018-11-22: 10 mL

## 2018-11-22 MED ORDER — FENTANYL CITRATE (PF) 100 MCG/2ML IJ SOLN
INTRAMUSCULAR | Status: AC
  Filled 2018-11-22: qty 2

## 2018-11-22 MED ORDER — LIDOCAINE VISCOUS HCL 2 % MT SOLN
OROMUCOSAL | Status: AC
  Filled 2018-11-22: qty 15

## 2018-11-22 MED ORDER — FENTANYL CITRATE (PF) 100 MCG/2ML IJ SOLN
50.0000 ug | INTRAMUSCULAR | Status: DC | PRN
  Administered 2018-11-22: 50 ug via INTRAVENOUS
  Filled 2018-11-22: qty 2

## 2018-11-22 MED ORDER — HYDROCODONE-ACETAMINOPHEN 5-325 MG PO TABS: 1.0000 | ORAL_TABLET | ORAL | Status: DC | PRN

## 2018-11-22 MED ORDER — SODIUM CHLORIDE 0.9% IV SOLUTION: Freq: Once | INTRAVENOUS | Status: DC

## 2018-11-22 MED ORDER — POTASSIUM CHLORIDE IN NACL 40-0.9 MEQ/L-% IV SOLN
INTRAVENOUS | Status: DC
  Administered 2018-11-22 – 2018-11-23 (×2): 50 mL/h via INTRAVENOUS
  Filled 2018-11-22 (×2): qty 1000

## 2018-11-22 MED ORDER — FENTANYL CITRATE (PF) 100 MCG/2ML IJ SOLN
INTRAMUSCULAR | Status: AC | PRN
  Administered 2018-11-22 (×2): 25 ug via INTRAVENOUS

## 2018-11-22 NOTE — Care Management Important Message (Signed)
Important Message  Patient Details  Name: Lance Herring MRN: [id number] Date of Birth: January 31, 1946   Medicare Important Message Given:  Yes    Tommy Medal 11/22/2018, 10:47 AM

## 2018-11-22 NOTE — Procedures (Signed)
Pre procedure Dx: Recurrent esophageal cancer. Post Procedure Dx: Same  Successful fluoroscopic guided insertion of gastrostomy tube.   The gastrostomy tube may be used immediately for medications.   Tube feeds may be initiated in 24 hours as per the primary team.    EBL: Minimal  Complications: None immediate  Ronny Bacon, MD Pager #: [phone]

## 2018-11-22 NOTE — Care Management Note (Signed)
Case Management Note  Patient Details  Name: Lance Herring MRN: [id number] Date of Birth: October 13, 1946  Subjective/Objective:    Pt transferred from [facility] for GI esophageal stent consideration             Action/Plan:   PTA independent from home with wife.  Pts wife informed CM that pt has PCP and denied barriers with paying for medications as prescribed.   Pt scheduled for Peg tube placement 11/22/18   Expected Discharge Date:  11/23/18               Expected Discharge Plan:  [place]  In-House Referral:     Discharge planning Services  CM Consult  Post Acute Care Choice:    Choice offered to:     DME Arranged:    DME Agency:     HH Arranged:    HH Agency:     Status of Service:  In process, will continue to follow  If discussed at Long Length of Stay Meetings, dates discussed:    Additional Comments: 11/22/2018  CM informed that pts PCP is actually in [place] - pt needs PCP in his local area.  Pt has active insurance - declined for CM to set up clinic appt.  CM provided pt [facility] and also provided option for pt to contact insurance company for assistance in locating local PCP    11/21/2018 Pt had previous peg tube removed during the summer months of 2019.  Wife can not recall the supplier of the equipment nor the [facility] agency - wife informed CM that [facility] and DME was arranged while stay in [facility].  Wife declined for CM to contact PCP (Dr Marvis Repress and requested CM to arrange [facility] with a new company from [employer].gov list.  Wife chose [facility] for peg tub supplies and feeds. CM provided medicare.gov list.  Pts address at discharge will be [place], [place].    Pt chose [facility] for [facility] - agency contacted and tentative referral accepted pending orders   Pts wife chose [facility] for tube feed and supplies - agency accepted referral..  Attending will write tube feed orders once tube feeds have been tolerated.  CM requested attending  to write [place] orders.   -Maryclare Labrador, RN 11/22/2018, 10:40 AM

## 2018-11-22 NOTE — Care Management Important Message (Signed)
Important Message  Patient Details  Name: Lance Herring MRN: [id number] Date of Birth: 11-17-1945   Medicare Important Message Given:  Yes     Tommy Medal 11/22/2018, 1:22 PM

## 2018-11-22 NOTE — Progress Notes (Signed)
PROGRESS NOTE   Sasuke Yaffe  ASN:[id number]    DOB: 03-14-1946    DOA: 11/19/2018  PCP: No primary care provider on file.   I have briefly reviewed patients previous medical records in [facility].  Brief Narrative:  73 year old married male, retired Engineer, structural and worked for the [employer], has 2 houses and lived between [place] and [place], [place] of DM 2, HTN, stage III CKD, esophageal cancer diagnosed in 2018 and completed chemoradiation in [place] in June 2019, oncologist in [place], initially presented to [place] ED on evening of 1/23 due to syncope x2 and 2 days history of melena.  Hemoglobin 6.5, potassium 2.5, initially transferred from [facility] to [facility] where he received 3 units PRBC transfusion, potassium replaced, underwent EGD for UGIB but unfortunately unable to get the scope past the GE junction due to tumor.  He was then transferred to [facility] for CVTS consultation for consideration for surgery and GI consultation for consideration for esophageal stent.  Oncology consulted 1/26.  IR consulted for G-tube placement and plan for 1/28.   Assessment & Plan:   Principal Problem:   UGIB (upper gastrointestinal bleed) Active Problems:   Acute blood loss anemia   Esophageal cancer (HCC)   DM2 (diabetes mellitus, type 2) (HCC)   HTN (hypertension)   CKD (chronic kidney disease) stage 3, GFR 30-59 ml/min (HCC)   Malnutrition of moderate degree   Acute upper GI bleed - Most likely due to friable esophageal cancer - EGD attempted at OSH but unable to get the scope past the GE junction due to tumor.  However as per oncology input, underwent biopsies and they are following up on pathology. - As per GI input here, the mass was friable and there is no maneuver that can be performed to arrest any bleeding.  Also patient able to tolerate liquids, hence not end-stage, not completely obstructed and so no reason to place an esophageal stent. - Overt GI bleeding seems to  have stopped for now.  Remains at risk for recurrent bleeding. - Continue PPI for now. - Oncology follow-up appreciated.  They have discussed with his oncologist in [place].  Please refer to their note from 1/27 for details.  Acute blood loss anemia -Secondary to upper GI bleed. -Transfused 3 units of PRBC at outside hospital. -Initial hemoglobin at outside hospital was 6.5.  This improved to 8.9.  Hemoglobin has been gradually drifting down, 7.5 today.  No overt bleeding but he could have gradual blood loss from esophageal cancer. -Transfusing a unit of PRBC today, patient and spouse agreeable.  Follow CBCs closely.  Syncope -Most likely due to symptomatic acute blood loss anemia. -Patient counseled that he should not drive for 6 months. -Mobilize as tolerated.  Ordered PT.  Acute on stage III chronic kidney disease -Baseline creatinine not known. -Presented with creatinine of 2.02.  Gradually improving, creatinine down to 1.6.  Continue IV fluids and follow BMP closely. -No mention of hydronephrosis on recent CT abdomen.  Hypokalemia -Potassium 2.8 on 1/25. -Aggressively replaced IV.  Potassium now normal.  Magnesium normal.  Follow BMP.  Relapsed esophageal cancer -Patient states that he saw his oncologist in [place] on 11/15/2018 at which time he forgot to mention about his dysphagia issues.  He was supposed to follow-up in February with PET scan. -Patient and spouse indicate that he now wishes to continue his cancer care locally. -I consulted Dr. Lindi Adie and his input from 1/26 appreciated.  He recommended proceeding with G-tube  placement for nutritional needs, once stable can be discharged home for outpatient close follow-up at the cancer center for PET scan and further management.  They will address palliative care as outpatient. -Continue clear liquids.  IR consulted for G-tube placement which is to be placed by IR on 1/28 -As per CVTS input, not candidate for esophagectomy. -CT  abdomen and chest without contrast from 1/26 appreciated: 10 mm pulmonary nodule left lung apex-isolated metastatic disease not excluded, and thickening of the distal esophagus likely consistent with known residual esophageal carcinoma, no high-grade esophageal obstruction or perforation, prior granulomatous disease of lungs. -Oncology input from 1/27 appreciated.  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  He was diagnosed in April 2018.  He was a candidate for surgical resection but he declined surgery.  He had subsequent PEG tube and received chemoradiation that he completed in August 2018.  He had EGD October 2018 and biopsies confirmed no residual disease.  He had recent PET scan in September 2019 that was negative for disease.  He was seen by his oncologist Dr. Jaymes Graff on 11/15/2018 at which time hemoglobin was 10.8, CEA 3.4 down from 3.9. -Oncology are awaiting records from Dr. Tawny Asal at the [facility] and radiation oncology are to review records. 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  Type II DM -Reasonable inpatient control.  Continue SSI.  Check A1c.  Elevated troponin -Suspect demand ischemia from acute GI bleed and acute blood loss anemia. -Troponin peaked at 1.5, has trended down.  No chest pain or acute EKG changes. -Had been seen by cardiology at OSH and felt also to be due to demand ischemia. -Not a candidate for antiplatelets due to GI bleed.  Essential hypertension -Controlled.  Moderate protein calorie malnutrition -Dietitian consulted.  Tube feeds to be started after tube has been placed by IR today.  Tube feeds possibly will be started tomorrow.  Constipation -Patient has had multiple BMs yesterday, brown-colored without melena or blood.  Continue bowel regimen.    DVT prophylaxis: SCD Code  Status: Full Family Communication: Discussed in detail with patient spouse at bedside, updated care and answered questions.  They are requesting for PCP.  Case management consulted. Disposition: DC home pending clinical improvement and further evaluation and intervention as discussed above   Consultants:  CVTS Medical oncology Interventional radiology  Procedures:  None  Antimicrobials:  None   Subjective: Had 3-4 BMs yesterday, brown stools.  No red blood.  Denies any other complaints.  ROS: As above, otherwise negative.  Objective:  Vitals:   11/21/18 0800 11/21/18 1119 11/21/18 1945 11/22/18 0833  BP:  133/71 (!) 141/64 128/69  Pulse: 83 67 74 70  Resp: 15 13    Temp:  98.7 F (37.1 C) 99 F (37.2 C) 98.4 F (36.9 C)  TempSrc:  Oral Oral Oral  SpO2:  99% 100% 99%  Weight:      Height:        Examination:  General exam: Pleasant elderly male, moderately built and poorly nourished lying comfortably propped up in bed.  Oral mucosa moist but pale. Respiratory system: Clear to auscultation.  No increased work of breathing.  Right Port-A-Cath.  Stable Cardiovascular system: S1 & S2 heard, RRR. No JVD, murmurs, rubs, gallops or clicks. No pedal edema.  Stable Gastrointestinal system: Abdomen is nondistended, soft and nontender. No organomegaly or masses felt. Normal bowel sounds heard.  Stable Central nervous system: Alert and oriented. No focal neurological deficits.  Stable Extremities: Symmetric 5 x 5 power. Skin: No rashes, lesions or ulcers Psychiatry: Judgement and insight appear normal. Mood & affect appropriate.     Data Reviewed: I have personally reviewed following labs and imaging studies  CBC: Recent Labs  Lab 11/19/18 0902 11/20/18 0533 11/21/18 0624 11/22/18 0422  WBC 13.3* 10.3 9.3 8.5  HGB 8.9* 7.8* 7.7* 7.5*  HCT 26.4* 22.8* 23.5* 23.3*  MCV 89.2 90.5 92.2 92.8  PLT 227 216 238 979   Basic Metabolic Panel: Recent Labs  Lab  11/19/18 0902 11/19/18 1742 11/20/18 0533 11/20/18 0858 11/20/18 1626 11/21/18 0624 11/22/18 0422  NA 142  --  139  --  138 137 139  K 2.6* 2.7* 2.8*  --  3.4* 3.3* 3.7  CL 104  --  100  --  102 101 105  CO2 31  --  29  --  27 26 26   GLUCOSE 122*  --  113*  --  133* 97 94  BUN 43*  --  27*  --  20 15 10   CREATININE 2.02*  --  1.88*  --  1.85* 1.75* 1.62*  CALCIUM 8.6*  --  8.2*  --  8.6* 8.3* 8.1*  MG  --   --   --  2.0  --   --   --    Liver Function Tests: Recent Labs  Lab 11/19/18 0902  AST 22  ALT 11  ALKPHOS 55  BILITOT 1.0  PROT 5.6*  ALBUMIN 2.7*   Cardiac Enzymes: Recent Labs  Lab 11/19/18 0902 11/19/18 1742  TROPONINI 0.80* 0.51*   HbA1C: No results for input(s): HGBA1C in the last 72 hours. CBG: Recent Labs  Lab 11/21/18 0736 11/21/18 1121 11/21/18 1604 11/21/18 2127 11/22/18 0826  GLUCAP 96 119* 181* 77 91    Recent Results (from the past 240 hour(s))  MRSA PCR Screening     Status: None   Collection Time: 11/19/18  3:20 AM  Result Value Ref Range Status   MRSA by PCR NEGATIVE NEGATIVE Final    Comment:        The GeneXpert MRSA Assay (FDA approved for NASAL specimens only), is one component of a comprehensive MRSA colonization surveillance program. It is not intended to diagnose MRSA infection nor to guide or monitor treatment for MRSA infections. Performed at [facility], [place] [address] [place], [place] [address]          Radiology Studies: Ct Abdomen Wo Contrast  Result Date: 11/20/2018 CLINICAL DATA:  History of carcinoma of the distal esophagus at the GE junction with prior documentation of high-grade obstruction and inability to advance endoscope through the level of obstruction. Evaluation is now performed for potential gastrostomy tube placement. EXAM: CT CHEST AND ABDOMEN WITHOUT CONTRAST TECHNIQUE: Multidetector CT imaging of the chest and abdomen was performed following the standard protocol without intravenous  contrast. COMPARISON:  None. FINDINGS: CT CHEST FINDINGS WITHOUT CONTRAST Cardiovascular: The heart size is normal. No pericardial fluid. Normal caliber thoracic aorta and central pulmonary arteries. Port-A-Cath present via the right jugular vein with the catheter tip in the lower SVC. Mediastinum/Nodes: Calcified right paratracheal and right hilar lymph nodes present consistent with prior granulomatous disease. No enlarged lymph nodes identified. The proximal to mid esophagus is mildly dilated and contains air and fluid. Distal esophagus demonstrates thickening and measures approximately 2.5 cm in diameter. No enlarged paraesophageal lymph nodes are identified.  No evidence of esophageal perforation. Lungs/Pleura: Noncalcified left apical lung nodule present in a subpleural location at the top of the left upper lobe measuring 9 mm in greatest transverse diameter on axial images and 10 mm in height on coronal reconstructions. Calcified anterior left upper lobe granuloma measures 5 mm. Additional 5 mm calcified granuloma is present in the right middle lobe just below the minor fissure. There is some scarring and mild bronchiectasis in the medial aspect of the right lower lobe abutting the posterior mediastinum. Part of this appearance may relate to prior radiation therapy. Musculoskeletal: No bony lesions or fractures identified. CT ABDOMEN FINDINGS WITHOUT CONTRAST Hepatobiliary: No focal liver abnormality is seen. No gallstones, gallbladder wall thickening, or biliary dilatation. Pancreas: Unremarkable. No pancreatic ductal dilatation or surrounding inflammatory changes. Spleen: Normal in size without focal abnormality. Adrenals/Urinary Tract: No adrenal masses. Probable central parapelvic cyst of the right kidney measuring 3 cm as there does not appear to be dilatation of the collecting system elsewhere in the right kidney. Stomach/Bowel: No hiatal hernia. The stomach is relatively vertical and decompressed at the  time of imaging. The proximal to mid stomach is predominantly posterior to a high splenic flexure and distal transverse colon. There is no colonic interposition between the distal stomach and the abdominal wall. No evidence of bowel obstruction or free air. No abnormal fluid collections or ascites. Vascular/Lymphatic: Calcified plaque in the abdominal aorta without evidence of abdominal aortic aneurysm. No enlarged abdominal lymph nodes identified. Other: No hernias or free fluid. Musculoskeletal: No bony lesions or fractures identified. IMPRESSION: 1. 10 mm pulmonary nodule at the left lung apex. Correlation with any prior outside imaging is recommended to determine whether this is a stable finding. Isolated metastatic disease cannot be excluded. 2. Thickening of the distal esophagus likely consistent with known residual esophageal carcinoma. No evidence of high-grade esophageal obstruction currently or perforation. No prominent paraesophageal lymph nodes are identified in the chest. 3. Evidence of prior granulomatous disease with 2 separate right lung calcified granulomas and right paratracheal and hilar calcified lymph nodes. 4. The proximal to mid stomach lies posterior to a high splenic flexure and distal transverse colon in the supine position when decompressed. There is no colon interposed between the distal stomach and the abdominal wall. If enough esophageal lumen is present to allow catheter passage for air insufflation of the stomach, there were likely will be a percutaneous window available for gastrostomy tube placement with adequate distention of the stomach. An esophogram has been recommended to evaluate caliber of the esophageal lumen and will be performed later. Electronically Signed   By: Aletta Edouard M.D.   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 Evaluation is now performed for potential gastrostomy tube placement. EXAM: CT CHEST AND ABDOMEN WITHOUT CONTRAST TECHNIQUE: Multidetector CT imaging of the chest and abdomen was performed following the standard protocol without intravenous contrast. COMPARISON:  None. FINDINGS: CT CHEST FINDINGS WITHOUT CONTRAST Cardiovascular: The heart size is normal. No pericardial fluid. Normal caliber thoracic aorta and central pulmonary arteries. Port-A-Cath present via the right jugular vein with the catheter tip in the lower SVC. Mediastinum/Nodes: Calcified right paratracheal and right hilar lymph nodes present consistent with prior granulomatous disease. No enlarged lymph nodes identified. The proximal to mid esophagus is mildly dilated  and contains air and fluid. Distal esophagus demonstrates thickening and measures approximately 2.5 cm in diameter. No enlarged paraesophageal lymph nodes are identified. No evidence of esophageal perforation. Lungs/Pleura: Noncalcified left apical lung nodule present in a subpleural location at the top of the left upper lobe measuring 9 mm in greatest transverse diameter on axial images and 10 mm in height on coronal reconstructions. Calcified anterior left upper lobe granuloma measures 5 mm. Additional 5 mm calcified granuloma is present in the right middle lobe just below the minor fissure. There is some scarring and mild bronchiectasis in the medial aspect of the right lower lobe abutting the posterior mediastinum. Part of this appearance may relate to prior radiation therapy. Musculoskeletal: No bony lesions or fractures identified. CT ABDOMEN FINDINGS WITHOUT CONTRAST Hepatobiliary: No focal liver abnormality is seen. No gallstones, gallbladder wall thickening, or biliary dilatation. Pancreas: Unremarkable. No pancreatic ductal dilatation or surrounding inflammatory changes. Spleen: Normal in  size without focal abnormality. Adrenals/Urinary Tract: No adrenal masses. Probable central parapelvic cyst of the right kidney measuring 3 cm as there does not appear to be dilatation of the collecting system elsewhere in the right kidney. Stomach/Bowel: No hiatal hernia. The stomach is relatively vertical and decompressed at the time of imaging. The proximal to mid stomach is predominantly posterior to a high splenic flexure and distal transverse colon. There is no colonic interposition between the distal stomach and the abdominal wall. No evidence of bowel obstruction or free air. No abnormal fluid collections or ascites. Vascular/Lymphatic: Calcified plaque in the abdominal aorta without evidence of abdominal aortic aneurysm. No enlarged abdominal lymph nodes identified. Other: No hernias or free fluid. Musculoskeletal: No bony lesions or fractures identified. IMPRESSION: 1. 10 mm pulmonary nodule at the left lung apex. Correlation with any prior outside imaging is recommended to determine whether this is a stable finding. Isolated metastatic disease cannot be excluded. 2. Thickening of the distal esophagus likely consistent with known residual esophageal carcinoma. No evidence of high-grade esophageal obstruction currently or perforation. No prominent paraesophageal lymph nodes are identified in the chest. 3. Evidence of prior granulomatous disease with 2 separate right lung calcified granulomas and right paratracheal and hilar calcified lymph nodes. 4. The proximal to mid stomach lies posterior to a high splenic flexure and distal transverse colon in the supine position when decompressed. There is no colon interposed between the distal stomach and the abdominal wall. If enough esophageal lumen is present to allow catheter passage for air insufflation of the stomach, there were likely will be a percutaneous window available for gastrostomy tube placement with adequate distention of the stomach. An esophogram  has been recommended to evaluate caliber of the esophageal lumen and will be performed later. Electronically Signed   By: Aletta Edouard M.D.   On: 11/20/2018 13:45   Dg Esophagus W Single Cm (sol Or Thin Ba)  Result Date: 11/21/2018 CLINICAL DATA:  Esophageal cancer, pre PEG tube placement anatomical evaluation. EXAM: ESOPHOGRAM/BARIUM SWALLOW TECHNIQUE: Single contrast examination was performed using 50 cc Omnipaque 300. FLUOROSCOPY TIME:  Fluoroscopy Time:  1 minutes 12 seconds Radiation Exposure Index (if provided by the fluoroscopic device): Number of Acquired Spot Images: 4 COMPARISON:  CT chest 11/20/2018. FINDINGS: A limited examination was performed in the reclined LPO position. The distal esophagus is dilated. A long segment narrow stricture is seen at the gastroesophageal junction with trickling of contrast into the proximal stomach. 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 Electronically Signed   By: Lorin Picket M.D.   On: 11/21/2018 09:34        Scheduled Meds: . sodium chloride   Intravenous Once  . docusate  100 mg Oral BID  . feeding supplement  1 Container Oral TID BM  . insulin aspart  0-9 Units Subcutaneous TID WC  . pantoprazole (PROTONIX) IV  40 mg Intravenous Q12H  . sodium chloride flush  10-40 mL Intracatheter Q12H   Continuous Infusions: . 0.9 % NaCl with KCl 40 mEq / L    .  ceFAZolin (ANCEF) IV       LOS: 3 days     Vernell Leep, MD, FACP, [facility]. [facility]  To contact the attending provider between 7A-7P or the covering provider during after hours 7P-7A, please log into the web site [email or web address] and access using universal [place] password for that web site. If you do not have the password, please call the hospital operator.  11/22/2018, 10:27 AM

## 2018-11-23 LAB — CBC
HCT: 27.1 % — ABNORMAL LOW (ref 39.0–52.0)
HEMOGLOBIN: 8.8 g/dL — AB (ref 13.0–17.0)
MCH: 29.1 pg (ref 26.0–34.0)
MCHC: 32.5 g/dL (ref 30.0–36.0)
MCV: 89.7 fL (ref 80.0–100.0)
Platelets: 294 10*3/uL (ref 150–400)
RBC: 3.02 MIL/uL — ABNORMAL LOW (ref 4.22–5.81)
RDW: 17.7 % — ABNORMAL HIGH (ref 11.5–15.5)
WBC: 9.5 10*3/uL (ref 4.0–10.5)
nRBC: 0 % (ref 0.0–0.2)

## 2018-11-23 LAB — BPAM RBC
BLOOD PRODUCT EXPIRATION DATE: 202002042359
ISSUE DATE / TIME: 202001281141
Unit Type and Rh: 600

## 2018-11-23 LAB — TYPE AND SCREEN
ABO/RH(D): A NEG
Antibody Screen: NEGATIVE
Unit division: 0

## 2018-11-23 LAB — BASIC METABOLIC PANEL
Anion gap: 9 (ref 5–15)
BUN: 9 mg/dL (ref 8–23)
CHLORIDE: 106 mmol/L (ref 98–111)
CO2: 23 mmol/L (ref 22–32)
Calcium: 8.3 mg/dL — ABNORMAL LOW (ref 8.9–10.3)
Creatinine, Ser: 1.58 mg/dL — ABNORMAL HIGH (ref 0.61–1.24)
GFR calc Af Amer: 51 mL/min — ABNORMAL LOW (ref >60)
GFR calc non Af Amer: 44 mL/min — ABNORMAL LOW (ref >60)
Glucose, Bld: 85 mg/dL (ref 70–99)
Potassium: 3.7 mmol/L (ref 3.5–5.1)
SODIUM: 138 mmol/L (ref 135–145)

## 2018-11-23 LAB — GLUCOSE, CAPILLARY
GLUCOSE-CAPILLARY: 118 mg/dL — AB (ref 70–99)
Glucose-Capillary: 70 mg/dL (ref 70–99)
Glucose-Capillary: 103 mg/dL — ABNORMAL HIGH (ref 70–99)
Glucose-Capillary: 127 mg/dL — ABNORMAL HIGH (ref 70–99)

## 2018-11-23 MED ORDER — FREE WATER
100.0000 mL | Freq: Four times a day (QID) | Status: DC
  Administered 2018-11-23 – 2018-11-24 (×3): 100 mL

## 2018-11-23 MED ORDER — JEVITY 1.2 CAL PO LIQD: 1000.0000 mL | ORAL | Status: DC

## 2018-11-23 MED ORDER — JEVITY 1.2 CAL PO LIQD
474.0000 mL | Freq: Four times a day (QID) | ORAL | Status: DC
  Administered 2018-11-23: 100 mL
  Administered 2018-11-23: 474 mL
  Administered 2018-11-24: 250 mL
  Filled 2018-11-23 (×6): qty 474

## 2018-11-23 NOTE — Progress Notes (Signed)
I spoke with Dr. Gordy Savers in pathology at [facility]. The specimen obtained from the EGD last week revealed no malignant cells though the specimen measured 2 x 2 mm and was in two pieces. There was ulceration described and acanthotic cells in squamous epithelium without malignancy. I've reviewed his case also with Dr. Servando Snare, and he recommends resampling the patient if medical oncology agrees. I've copied Dr. Lindi Adie on our discussion and we will be on standby for formal consultation. We would not anticipate radiotherapy unless there is biopsy confirmed recurrence.      Carola Rhine, PAC

## 2018-11-23 NOTE — Progress Notes (Signed)
Patient ID: Lance Herring, male   DOB: [birth year], 73 y.o.   MRN: [id number]   G tube placed yesterday in IR T tacks in place Discussed with Dr Pascal Lux  Pt will need T tack removal 2 weeks  OP orders in place-- for removal at [facility] IR Pt is aware he will need to have these out in 2 weeks--- he will hear from [facility] IR for appt time and date  RN aware

## 2018-11-23 NOTE — Progress Notes (Signed)
PROGRESS NOTE    Lance Herring  XHB:[id number] DOB: 1946-03-24 DOA: 11/19/2018 PCP: No primary care provider on file.     Brief Narrative:  Lance Herring is a 73 year old married male, retired Engineer, structural and worked for the [employer], has 2 houses and lived between [place] and [place], [place] of DM 2, HTN, stage III CKD, esophageal cancer diagnosed in 2018 and completed chemoradiation in [place] in June 2019, oncologist in [place], initially presented to [place] ED on evening of 1/23 due to syncope x2 and 2 days history of melena.  Hemoglobin 6.5, potassium 2.5, initially transferred from [facility] to [place] where he received 3 units PRBC transfusion, potassium replaced, underwent EGD for UGIB but unfortunately unable to get the scope past the GE junction due to tumor.  He was then transferred to [facility] for CVTS consultation for consideration for surgery and GI consultation for consideration for esophageal stent.  Oncology consulted 1/26.  IR consulted for G-tube placement, placed on 1/28.  New events last 24 hours / Subjective: No acute events overnight.  No further bleeding noted.  Assessment & Plan:   Principal Problem:   UGIB (upper gastrointestinal bleed) Active Problems:   Acute blood loss anemia   Esophageal cancer (HCC)   DM2 (diabetes mellitus, type 2) (HCC)   HTN (hypertension)   CKD (chronic kidney disease) stage 3, GFR 30-59 ml/min (HCC)   Malnutrition of moderate degree   Acute upper GI bleed -Most likely due to friable esophageal cancer -EGD attempted at OSH but unable to get the scope past the GE junction due to tumor.    Biopsy revealed no malignant cells, will likely need repeat sampling -As per GI input here, the mass was friable and there is no maneuver that can be performed to arrest any bleeding.  Also patient able to tolerate liquids, hence not end-stage, not completely obstructed and so no reason to place an esophageal  stent.  Acute blood loss anemia -Secondary to upper GI bleed -Total 4 u pRBC transfusion   Syncope -Most likely due to symptomatic acute blood loss anemia. -Patient counseled that he should not drive for 6 months.   AKI on CKD stage 3  -Baseline creatinine not known -Presented with creatinine of 2.02.  Gradually improving  Relapsed esophageal cancer -Patient states that he saw his oncologist in [place] on 11/15/2018 at which time he forgot to mention about his dysphagia issues.  He was supposed to follow-up in February with PET scan. -As per CVTS input, not candidate for esophagectomy -CT abdomen and chest without contrast from 1/26 appreciated: 10 mm pulmonary nodule left lung apex-isolated metastatic disease not excluded, and thickening of the distal esophagus likely consistent with known residual esophageal carcinoma, no high-grade esophageal obstruction or perforation, prior granulomatous disease of lungs. -Patient and spouse indicate that he now wishes to continue his cancer care locally. -Oncology consulted; plan for outpatient follow up with Dr. Burr Medico   Type II DM -Continue SSI  Elevated troponin -Suspect demand ischemia from acute GI bleed and acute blood loss anemia. -Troponin peaked at 1.5, has trended down.  No chest pain or acute EKG changes. -Had been seen by cardiology at OSH and felt also to be due to demand ischemia. -Not a candidate for antiplatelets due to GI bleed.  Essential hypertension -Controlled.  Moderate protein calorie malnutrition -Dietitian consulted. Start TF today    DVT prophylaxis: SCD Code Status: Full code Family Communication: Wife at bedside Disposition Plan: Plan for  discharge home 1/30 as well as patient is tolerating tube feedings today.  He needs to follow-up with Dr. Burr Medico to coordinate care plan for his likely recurrence of esophageal cancer   Consultants:   Cardiothoracic surgery  Oncology  GI  Interventional  radiology   Antimicrobials:  Anti-infectives (From admission, onward)   Start     Dose/Rate Route Frequency Ordered Stop   11/22/18 0000  ceFAZolin (ANCEF) IVPB 2g/100 mL premix     2 g 200 mL/hr over 30 Minutes Intravenous To Radiology 11/21/18 1245 11/22/18 1657       Objective: Vitals:   11/22/18 1749 11/22/18 1943 11/23/18 0854 11/23/18 1231  BP: (!) 131/57 138/67 130/62 (!) 142/71  Pulse: 66 (!) 59 77 60  Resp: 16  18 18   Temp: 98 F (36.7 C) 98.9 F (37.2 C) 98.6 F (37 C) 98.5 F (36.9 C)  TempSrc: Oral Oral Oral Oral  SpO2: 100% 100% 97% 100%  Weight:      Height:        Intake/Output Summary (Last 24 hours) at 11/23/2018 1349 Last data filed at 11/23/2018 0958 Gross per 24 hour  Intake 1328.27 ml  Output 150 ml  Net 1178.27 ml   Filed Weights   11/19/18 0345  Weight: 67.1 kg    Examination: General exam: Appears calm and comfortable  Respiratory system: Clear to auscultation. Respiratory effort normal. Cardiovascular system: S1 & S2 heard, RRR. No JVD, murmurs, rubs, gallops or clicks. No pedal edema. Gastrointestinal system: Abdomen is nondistended, soft and nontender. No organomegaly or masses felt. Normal bowel sounds heard. + G-tube in place Central nervous system: Alert and oriented. No focal neurological deficits. Extremities: Symmetric 5 x 5 power. Skin: No rashes, lesions or ulcers Psychiatry: Judgement and insight appear normal. Mood & affect appropriate.   Data Reviewed: I have personally reviewed following labs and imaging studies  CBC: Recent Labs  Lab 11/20/18 0533 11/21/18 0624 11/22/18 0422 11/22/18 1807 11/23/18 0441  WBC 10.3 9.3 8.5 13.4* 9.5  HGB 7.8* 7.7* 7.5* 9.8* 8.8*  HCT 22.8* 23.5* 23.3* 29.9* 27.1*  MCV 90.5 92.2 92.8 89.0 89.7  PLT 216 238 254 303 768   Basic Metabolic Panel: Recent Labs  Lab 11/20/18 0533 11/20/18 0858 11/20/18 1626 11/21/18 0624 11/22/18 0422 11/23/18 0441  NA 139  --  138 137 139 138  K  2.8*  --  3.4* 3.3* 3.7 3.7  CL 100  --  102 101 105 106  CO2 29  --  27 26 26 23   GLUCOSE 113*  --  133* 97 94 85  BUN 27*  --  20 15 10 9   CREATININE 1.88*  --  1.85* 1.75* 1.62* 1.58*  CALCIUM 8.2*  --  8.6* 8.3* 8.1* 8.3*  MG  --  2.0  --   --   --   --    GFR: Estimated Creatinine Clearance: 41.3 mL/min (A) (by C-G formula based on SCr of 1.58 mg/dL (H)). Liver Function Tests: Recent Labs  Lab 11/19/18 0902  AST 22  ALT 11  ALKPHOS 55  BILITOT 1.0  PROT 5.6*  ALBUMIN 2.7*   No results for input(s): LIPASE, AMYLASE in the last 168 hours. No results for input(s): AMMONIA in the last 168 hours. Coagulation Profile: Recent Labs  Lab 11/22/18 0422  INR 1.05   Cardiac Enzymes: Recent Labs  Lab 11/19/18 0902 11/19/18 1742  TROPONINI 0.80* 0.51*   BNP (last 3 results) No results for input(s):  PROBNP in the last 8760 hours. HbA1C: No results for input(s): HGBA1C in the last 72 hours. CBG: Recent Labs  Lab 11/22/18 1119 11/22/18 1725 11/22/18 2118 11/23/18 0737 11/23/18 1134  GLUCAP 88 124* 82 70 103*   Lipid Profile: No results for input(s): CHOL, HDL, LDLCALC, TRIG, CHOLHDL, LDLDIRECT in the last 72 hours. Thyroid Function Tests: No results for input(s): TSH, T4TOTAL, FREET4, T3FREE, THYROIDAB in the last 72 hours. Anemia Panel: No results for input(s): VITAMINB12, FOLATE, FERRITIN, TIBC, IRON, RETICCTPCT in the last 72 hours. Sepsis Labs: No results for input(s): PROCALCITON, LATICACIDVEN in the last 168 hours.  Recent Results (from the past 240 hour(s))  MRSA PCR Screening     Status: None   Collection Time: 11/19/18  3:20 AM  Result Value Ref Range Status   MRSA by PCR NEGATIVE NEGATIVE Final    Comment:        The GeneXpert MRSA Assay (FDA approved for NASAL specimens only), is one component of a comprehensive MRSA colonization surveillance program. It is not intended to diagnose MRSA infection nor to guide or monitor treatment for MRSA  infections. Performed at [facility], [place] [address], [place], [place] [address]        Radiology Studies: Ir Gastrostomy Tube Mod Sed  Result Date: 11/22/2018 INDICATION: Recurrent esophageal cancer, now with high-grade obstruction involving the distal esophagus. Please perform image guided gastrostomy tube placement for enteric nutrition supplementation purposes. EXAM: PUSH GASTROSTOMY TUBE PLACEMENT COMPARISON:  CT the chest, abdomen pelvis - 11/20/2018; barium swallow examination-11/21/2018 MEDICATIONS: Ancef 2 gm IV; Antibiotics were administered within 1 hour of the procedure. CONTRAST:  10 mL of Isovue 300 administered into the gastric lumen. ANESTHESIA/SEDATION: Moderate (conscious) sedation was employed during this procedure. A total of Versed 1.5 mg and Fentanyl 5 mcg was administered intravenously. Moderate Sedation Time: 15 minutes. The patient's level of consciousness and vital signs were monitored continuously by radiology nursing throughout the procedure under my direct supervision. FLUOROSCOPY TIME:  2 minute, 42 seconds (16 mGy) COMPLICATIONS: None immediate. PROCEDURE: Informed written consent was obtained from the patient and the patient's wife following explanation of the procedure, risks, benefits and alternatives. A time out was performed prior to the initiation of the procedure. Ultrasound scanning was performed to demarcate the edge of the left lobe of the liver. Maximal barrier sterile technique utilized including caps, mask, sterile gowns, sterile gloves, large sterile drape, hand hygiene and Betadine prep. The left upper quadrant was sterilely prepped and draped. A oral gastric catheter was inserted into the stomach under fluoroscopy. The existing nasogastric feeding tube was removed. Given patient's nearly obstructing long segment distal esophageal lesion, the decision was made to place a push gastrostomy tube. The left costal margin and air opacified transverse colon  were identified and avoided. Air was injected into the stomach for insufflation and visualization under fluoroscopy. Under sterile conditions and local anesthesia, 3 T tacks were utilized to pexy the anterior aspect of the stomach against the ventral abdominal wall. Contrast injection confirmed appropriate positioning of each of the T tacks. An incision was made between the T tacks and a 17 gauge trocar needle was utilized to access the stomach. Needle position was confirmed within the stomach with aspiration of air and injection of a small amount of contrast. They are intermittent fluoroscopic guidance, the access needle was exchanged for multiple serial dilators ultimately allowing placement of a 20 French peel-away sheath. Next an 18-French balloon retention gastrostomy tube was inserted through  the peel-away sheath and the peel-away sheath was removed. The retention balloon was insufflated with a mixture of dilute saline and contrast and pulled taut against the anterior wall of the stomach. The external disc was cinched. Contrast injection confirms positioning within the stomach. Several spot radiographic images were obtained in various obliquities for documentation. The patient tolerated procedure well without immediate post procedural complication. FINDINGS: After successful fluoroscopic guided placement, the gastrostomy tube is appropriately positioned with internal retention balloon against the ventral aspect of the gastric lumen. IMPRESSION: Successful fluoroscopic insertion of an 66 French balloon retention gastrostomy tube. The gastrostomy may be used immediately for medication administration and in 24 hrs for the initiation of feeds. Electronically Signed   By: Sandi Mariscal M.D.   On: 11/22/2018 17:21      Scheduled Meds: . sodium chloride   Intravenous Once  . docusate  100 mg Oral BID  . feeding supplement  1 Container Oral TID BM  . feeding supplement (JEVITY 1.2 CAL)  474 mL Per Tube QID  .  free water  100 mL Per Tube QID  . insulin aspart  0-9 Units Subcutaneous TID WC  . pantoprazole (PROTONIX) IV  40 mg Intravenous Q12H  . sodium chloride flush  10-40 mL Intracatheter Q12H   Continuous Infusions:   LOS: 4 days    Time spent: 40 minutes   Dessa Phi, DO [facility] [email or web address] 11/23/2018, 1:49 PM

## 2018-11-23 NOTE — Progress Notes (Signed)
Referring Physician(s): Dr Gala Lewandowsky  Supervising Physician: Arne Cleveland  Patient Status:  [facility] - In-pt  Chief Complaint:  Esophageal cancer Dysphagia   Subjective:  Percutaneous gastric tube placed 1/28 in IR No complaints  Allergies: Patient has no known allergies.  Medications: Prior to Admission medications   Medication Sig Start Date End Date Taking? Authorizing Provider  allopurinol (ZYLOPRIM) 100 MG tablet Take 100 mg by mouth daily.   Yes [provider]  amLODipine (NORVASC) 5 MG tablet Take 5 mg by mouth daily.   Yes [provider]  chlorthalidone (HYGROTON) 25 MG tablet Take 25 mg by mouth daily.   Yes [provider]  glyBURIDE-metformin (GLUCOVANCE) 5-500 MG tablet Take 1 tablet by mouth 2 (two) times daily.   Yes [provider]  labetalol (NORMODYNE) 100 MG tablet Take 100 mg by mouth 2 (two) times daily.   Yes [provider]  pioglitazone (ACTOS) 45 MG tablet Take 45 mg by mouth daily.   Yes [provider]     Vital Signs: BP 138/67 (BP Location: Right Arm)   Pulse (!) 59   Temp 98.9 F (37.2 C) (Oral)   Resp 16   Ht 5\' 11"  (1.803 m)   Wt 147 lb 14.9 oz (67.1 kg)   SpO2 100%   BMI 20.63 kg/m   Physical Exam Abdominal:     General: Bowel sounds are normal.     Palpations: Abdomen is soft.  Skin:    General: Skin is warm and dry.     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 Evaluation is now performed for potential gastrostomy tube placement. EXAM: CT CHEST AND ABDOMEN WITHOUT CONTRAST TECHNIQUE: Multidetector CT imaging of the chest and abdomen was performed following the standard protocol without intravenous  contrast. COMPARISON:  None. FINDINGS: CT CHEST FINDINGS WITHOUT CONTRAST Cardiovascular: The heart size is normal. No pericardial fluid. Normal caliber thoracic aorta and central pulmonary arteries. Port-A-Cath present via the right jugular vein with the catheter tip in the lower SVC. Mediastinum/Nodes: Calcified right paratracheal and right hilar lymph nodes present consistent with prior granulomatous disease. No enlarged lymph nodes identified. The proximal to mid esophagus is mildly dilated and contains air and fluid. Distal esophagus demonstrates thickening and measures approximately 2.5 cm in diameter. No enlarged paraesophageal lymph nodes are identified. No evidence of esophageal perforation. Lungs/Pleura: Noncalcified left apical lung nodule present in a subpleural location at the top of the left upper lobe measuring 9 mm in greatest transverse diameter on axial images and 10 mm in height on coronal reconstructions. Calcified anterior left upper lobe granuloma measures 5 mm. Additional 5 mm calcified granuloma is present in the right middle lobe just below the minor fissure. There is some scarring and mild bronchiectasis in the medial aspect of the right lower lobe abutting the posterior mediastinum. Part of this appearance may relate to prior radiation therapy. Musculoskeletal: No bony lesions or fractures identified. CT ABDOMEN FINDINGS WITHOUT CONTRAST Hepatobiliary: No focal liver abnormality is seen. No gallstones, gallbladder wall thickening, or biliary dilatation. Pancreas: Unremarkable. No pancreatic ductal dilatation or surrounding inflammatory changes. Spleen: Normal in size without focal abnormality. Adrenals/Urinary Tract: No adrenal masses. Probable central parapelvic cyst  of the right kidney measuring 3 cm as there does not appear to be dilatation of the collecting system elsewhere in the right kidney. Stomach/Bowel: No hiatal hernia. The stomach is relatively vertical and decompressed at the  time of imaging. The proximal to mid stomach is predominantly posterior to a high splenic flexure and distal transverse colon. There is no colonic interposition between the distal stomach and the abdominal wall. No evidence of bowel obstruction or free air. No abnormal fluid collections or ascites. Vascular/Lymphatic: Calcified plaque in the abdominal aorta without evidence of abdominal aortic aneurysm. No enlarged abdominal lymph nodes identified. Other: No hernias or free fluid. Musculoskeletal: No bony lesions or fractures identified. IMPRESSION: 1. 10 mm pulmonary nodule at the left lung apex. Correlation with any prior outside imaging is recommended to determine whether this is a stable finding. Isolated metastatic disease cannot be excluded. 2. Thickening of the distal esophagus likely consistent with known residual esophageal carcinoma. No evidence of high-grade esophageal obstruction currently or perforation. No prominent paraesophageal lymph nodes are identified in the chest. 3. Evidence of prior granulomatous disease with 2 separate right lung calcified granulomas and right paratracheal and hilar calcified lymph nodes. 4. The proximal to mid stomach lies posterior to a high splenic flexure and distal transverse colon in the supine position when decompressed. There is no colon interposed between the distal stomach and the abdominal wall. If enough esophageal lumen is present to allow catheter passage for air insufflation of the stomach, there were likely will be a percutaneous window available for gastrostomy tube placement with adequate distention of the stomach. An esophogram has been recommended to evaluate caliber of the esophageal lumen and will be performed later. Electronically Signed   By: Aletta Edouard M.D.   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 Evaluation is now performed for potential gastrostomy tube placement. EXAM: CT CHEST AND ABDOMEN WITHOUT CONTRAST TECHNIQUE: Multidetector CT imaging of the chest and abdomen was performed following the standard protocol without intravenous contrast. COMPARISON:  None. FINDINGS: CT CHEST FINDINGS WITHOUT CONTRAST Cardiovascular: The heart size is normal. No pericardial fluid. Normal caliber thoracic aorta and central pulmonary arteries. Port-A-Cath present via the right jugular vein with the catheter tip in the lower SVC. Mediastinum/Nodes: Calcified right paratracheal and right hilar lymph nodes present consistent with prior granulomatous disease. No enlarged lymph nodes identified. The proximal to mid esophagus is mildly dilated and contains air and fluid. Distal esophagus demonstrates thickening and measures approximately 2.5 cm in diameter. No enlarged paraesophageal lymph nodes are identified. No evidence of esophageal perforation. Lungs/Pleura: Noncalcified left apical lung nodule present in a subpleural location at the top of the left upper lobe measuring 9 mm in greatest transverse diameter on axial images and 10 mm in height on coronal reconstructions. Calcified anterior left upper lobe granuloma measures 5 mm. Additional 5 mm calcified granuloma is present in the right middle lobe just below the minor fissure. There is some scarring and mild bronchiectasis in the medial aspect of the right lower lobe abutting the posterior mediastinum. Part of this appearance may relate to prior radiation therapy. Musculoskeletal: No bony lesions or fractures identified. CT ABDOMEN FINDINGS WITHOUT CONTRAST Hepatobiliary: No focal liver abnormality is seen. No gallstones, gallbladder wall thickening, or biliary dilatation. Pancreas: Unremarkable. No  pancreatic ductal dilatation or surrounding inflammatory changes. Spleen: Normal in  size without focal abnormality. Adrenals/Urinary Tract: No adrenal masses. Probable central parapelvic cyst of the right kidney measuring 3 cm as there does not appear to be dilatation of the collecting system elsewhere in the right kidney. Stomach/Bowel: No hiatal hernia. The stomach is relatively vertical and decompressed at the time of imaging. The proximal to mid stomach is predominantly posterior to a high splenic flexure and distal transverse colon. There is no colonic interposition between the distal stomach and the abdominal wall. No evidence of bowel obstruction or free air. No abnormal fluid collections or ascites. Vascular/Lymphatic: Calcified plaque in the abdominal aorta without evidence of abdominal aortic aneurysm. No enlarged abdominal lymph nodes identified. Other: No hernias or free fluid. Musculoskeletal: No bony lesions or fractures identified. IMPRESSION: 1. 10 mm pulmonary nodule at the left lung apex. Correlation with any prior outside imaging is recommended to determine whether this is a stable finding. Isolated metastatic disease cannot be excluded. 2. Thickening of the distal esophagus likely consistent with known residual esophageal carcinoma. No evidence of high-grade esophageal obstruction currently or perforation. No prominent paraesophageal lymph nodes are identified in the chest. 3. Evidence of prior granulomatous disease with 2 separate right lung calcified granulomas and right paratracheal and hilar calcified lymph nodes. 4. The proximal to mid stomach lies posterior to a high splenic flexure and distal transverse colon in the supine position when decompressed. There is no colon interposed between the distal stomach and the abdominal wall. If enough esophageal lumen is present to allow catheter passage for air insufflation of the stomach, there were likely will be a percutaneous window available for gastrostomy tube placement with adequate distention of the stomach. An esophogram  has been recommended to evaluate caliber of the esophageal lumen and will be performed later. Electronically Signed   By: Aletta Edouard M.D.   On: 11/20/2018 13:45   Ir Gastrostomy Tube Mod Sed  Result Date: 11/22/2018 INDICATION: Recurrent esophageal cancer, now with high-grade obstruction involving the distal esophagus. Please perform image guided gastrostomy tube placement for enteric nutrition supplementation purposes. EXAM: PUSH GASTROSTOMY TUBE PLACEMENT COMPARISON:  CT the chest, abdomen pelvis - 11/20/2018; barium swallow examination-11/21/2018 MEDICATIONS: Ancef 2 gm IV; Antibiotics were administered within 1 hour of the procedure. CONTRAST:  10 mL of Isovue 300 administered into the gastric lumen. ANESTHESIA/SEDATION: Moderate (conscious) sedation was employed during this procedure. A total of Versed 1.5 mg and Fentanyl 5 mcg was administered intravenously. Moderate Sedation Time: 15 minutes. The patient's level of consciousness and vital signs were monitored continuously by radiology nursing throughout the procedure under my direct supervision. FLUOROSCOPY TIME:  2 minute, 42 seconds (16 mGy) COMPLICATIONS: None immediate. PROCEDURE: Informed written consent was obtained from the patient and the patient's wife following explanation of the procedure, risks, benefits and alternatives. A time out was performed prior to the initiation of the procedure. Ultrasound scanning was performed to demarcate the edge of the left lobe of the liver. Maximal barrier sterile technique utilized including caps, mask, sterile gowns, sterile gloves, large sterile drape, hand hygiene and Betadine prep. The left upper quadrant was sterilely prepped and draped. A oral gastric catheter was inserted into the stomach under fluoroscopy. The existing nasogastric feeding tube was removed. Given patient's nearly obstructing long segment distal esophageal lesion, the decision was made to place a push gastrostomy tube. The left  costal margin and air opacified transverse colon were identified and avoided. Air was injected into  the stomach for insufflation and visualization under fluoroscopy. Under sterile conditions and local anesthesia, 3 T tacks were utilized to pexy the anterior aspect of the stomach against the ventral abdominal wall. Contrast injection confirmed appropriate positioning of each of the T tacks. An incision was made between the T tacks and a 17 gauge trocar needle was utilized to access the stomach. Needle position was confirmed within the stomach with aspiration of air and injection of a small amount of contrast. They are intermittent fluoroscopic guidance, the access needle was exchanged for multiple serial dilators ultimately allowing placement of a 20 French peel-away sheath. Next an 18-French balloon retention gastrostomy tube was inserted through the peel-away sheath and the peel-away sheath was removed. The retention balloon was insufflated with a mixture of dilute saline and contrast and pulled taut against the anterior wall of the stomach. The external disc was cinched. Contrast injection confirms positioning within the stomach. Several spot radiographic images were obtained in various obliquities for documentation. The patient tolerated procedure well without immediate post procedural complication. FINDINGS: After successful fluoroscopic guided placement, the gastrostomy tube is appropriately positioned with internal retention balloon against the ventral aspect of the gastric lumen. IMPRESSION: Successful fluoroscopic insertion of an 6 French balloon retention gastrostomy tube. The gastrostomy may be used immediately for medication administration and in 24 hrs for the initiation of feeds. Electronically Signed   By: Sandi Mariscal M.D.   On: 11/22/2018 17:21   Dg Esophagus W Single Cm (sol Or Thin Ba)  Result Date: 11/21/2018 CLINICAL DATA:  Esophageal cancer, pre PEG tube placement anatomical evaluation.  EXAM: ESOPHOGRAM/BARIUM SWALLOW TECHNIQUE: Single contrast examination was performed using 50 cc Omnipaque 300. FLUOROSCOPY TIME:  Fluoroscopy Time:  1 minutes 12 seconds Radiation Exposure Index (if provided by the fluoroscopic device): Number of Acquired Spot Images: 4 COMPARISON:  CT chest 11/20/2018. FINDINGS: A limited examination was performed in the reclined LPO position. The distal esophagus is dilated. A long segment narrow stricture is seen at the gastroesophageal junction with trickling of contrast into the proximal stomach. IMPRESSION: Long segment high-grade stricture at the gastroesophageal junction, in this patient with known esophageal cancer. Electronically Signed   By: Lorin Picket M.D.   On: 11/21/2018 09:34    Labs:  CBC: Recent Labs    11/21/18 0624 11/22/18 0422 11/22/18 1807 11/23/18 0441  WBC 9.3 8.5 13.4* 9.5  HGB 7.7* 7.5* 9.8* 8.8*  HCT 23.5* 23.3* 29.9* 27.1*  PLT 238 254 303 294    COAGS: Recent Labs    11/22/18 0422  INR 1.05    BMP: Recent Labs    11/20/18 1626 11/21/18 0624 11/22/18 0422 11/23/18 0441  NA 138 137 139 138  K 3.4* 3.3* 3.7 3.7  CL 102 101 105 106  CO2 27 26 26 23   GLUCOSE 133* 97 94 85  BUN 20 15 10 9   CALCIUM 8.6* 8.3* 8.1* 8.3*  CREATININE 1.85* 1.75* 1.62* 1.58*  GFRNONAA 36* 39* 42* 44*  GFRAA 42* 45* 49* 51*    LIVER FUNCTION TESTS: Recent Labs    11/19/18 0902  BILITOT 1.0  AST 22  ALT 11  ALKPHOS 55  PROT 5.6*  ALBUMIN 2.7*    Assessment and Plan:  G tube placed yesterday May use now Leave T tacks for now-- IR PA will let RN know when can be removed  Electronically Signed: Lavonia Drafts, PA-C 11/23/2018, 8:40 AM   I spent a total of 15 Minutes at the the  patient's bedside AND on the patient's hospital floor or unit, greater than 50% of which was counseling/coordinating care for G tube

## 2018-11-23 NOTE — Plan of Care (Signed)
  Problem: Health Behavior/Discharge Planning: Goal: Ability to manage health-related needs will improve Outcome: Progressing   Problem: Clinical Measurements: Goal: Ability to maintain clinical measurements within normal limits will improve Outcome: Progressing Goal: Will remain free from infection Outcome: Progressing Goal: Respiratory complications will improve Outcome: Progressing Goal: Cardiovascular complication will be avoided Outcome: Progressing   Problem: Activity: Goal: Risk for activity intolerance will decrease Outcome: Progressing   Problem: Nutrition: Goal: Adequate nutrition will be maintained Outcome: Progressing   Problem: Coping: Goal: Level of anxiety will decrease Outcome: Progressing   Problem: Elimination: Goal: Will not experience complications related to bowel motility Outcome: Progressing Goal: Will not experience complications related to urinary retention Outcome: Progressing   Problem: Safety: Goal: Ability to remain free from injury will improve Outcome: Progressing   Problem: Skin Integrity: Goal: Risk for impaired skin integrity will decrease Outcome: Progressing

## 2018-11-23 NOTE — Progress Notes (Signed)
Nutrition Follow-up  DOCUMENTATION CODES:   Non-severe (moderate) malnutrition in context of chronic illness  INTERVENTION:   -Continue Boost Breeze po TID, each supplement provides 250 kcal and 9 grams of protein  -Initiate bolus feedings of Jevity 1.2 cal 100 ml for first bolus and increase by 50 ml at each subsequent feeding until goal volume of 474 ml (2 cans) is reached  Goal bolus TF regimen: - Jevity 1.2 cal formula 474 ml (2 cans) QID - Free water 100 ml QID  Goal bolus tube feeding regimen provides 2275 kcal, 105 grams of protein, and 1936 ml of H2O (103% of kcal needs, 100% of protein needs).  NUTRITION DIAGNOSIS:   Moderate Malnutrition related to chronic illness(recurrent esophageal cancer) as evidenced by moderate fat depletion, moderate muscle depletion, severe muscle depletion.  Ongoing  GOAL:   Patient will meet greater than or equal to 90% of their needs  Progressing  MONITOR:   PO intake, Supplement acceptance, Diet advancement, Weight trends, Labs  REASON FOR ASSESSMENT:   Consult Enteral/tube feeding initiation and management  ASSESSMENT:   73 year old male with PMH significant for T2DM, HTN, CKD stage 3, esophageal cancer s/p chemoradiation completed in June 2019. Pt had a PEG in the past. Pt presented with bleeding and an EGD was performed at [facility] but unable to pass scope through GE junction due to mass. Pt noted to have recurrent esophageal cancer.  1/28- s/p G-tube placement   Reviewed I/O's: +1.3 L x 24 hours and +8 L since admission  Case discussed with RN, who reports G-tube has been cleared to use and plan to start TF today. Potential plan to d/c home tomorrow once home health services are arranged.   Spoke with pt at bedside, who reports tolerating clear liquids well. Pt consumed about 50% of cup of jello on lunch tray. Pt amenable to bolus feeding and recalls prior RD visit.   Spoke with RN regarding bolus feeding titration orders.    Labs reviewed: CBGS: 70-103 (inpatient orders for glycemic control are 0-9 units insulin aspart TID with meals).   Diet Order:   Diet Order            Diet clear liquid Room service appropriate? Yes; Fluid consistency: Thin  Diet effective now              EDUCATION NEEDS:   Education needs have been addressed  Skin:  Skin Assessment: Reviewed RN Assessment  Last BM:  11/21/18  Height:   Ht Readings from Last 1 Encounters:  11/19/18 5\' 11"  (1.803 m)    Weight:   Wt Readings from Last 1 Encounters:  11/19/18 67.1 kg    Ideal Body Weight:  78.2 kg  BMI:  Body mass index is 20.63 kg/m.  Estimated Nutritional Needs:   Kcal:  2000-2200  Protein:  95-110 grams  Fluid:  >/= 2.0 L    Dyasia Firestine A. Jimmye Norman, RD, LDN, CDE Pager: [phone] After hours Pager: [phone]

## 2018-11-24 LAB — CBC
HCT: 26.7 % — ABNORMAL LOW (ref 39.0–52.0)
Hemoglobin: 8.9 g/dL — ABNORMAL LOW (ref 13.0–17.0)
MCH: 29.8 pg (ref 26.0–34.0)
MCHC: 33.3 g/dL (ref 30.0–36.0)
MCV: 89.3 fL (ref 80.0–100.0)
Platelets: 303 10*3/uL (ref 150–400)
RBC: 2.99 MIL/uL — ABNORMAL LOW (ref 4.22–5.81)
RDW: 17.4 % — ABNORMAL HIGH (ref 11.5–15.5)
WBC: 6.8 10*3/uL (ref 4.0–10.5)
nRBC: 0 % (ref 0.0–0.2)

## 2018-11-24 LAB — MAGNESIUM: Magnesium: 1.9 mg/dL (ref 1.7–2.4)

## 2018-11-24 LAB — BASIC METABOLIC PANEL
Anion gap: 7 (ref 5–15)
BUN: 8 mg/dL (ref 8–23)
CALCIUM: 8 mg/dL — AB (ref 8.9–10.3)
CO2: 26 mmol/L (ref 22–32)
Chloride: 106 mmol/L (ref 98–111)
Creatinine, Ser: 1.67 mg/dL — ABNORMAL HIGH (ref 0.61–1.24)
GFR calc Af Amer: 47 mL/min — ABNORMAL LOW (ref >60)
GFR calc non Af Amer: 41 mL/min — ABNORMAL LOW (ref >60)
Glucose, Bld: 160 mg/dL — ABNORMAL HIGH (ref 70–99)
Potassium: 3.2 mmol/L — ABNORMAL LOW (ref 3.5–5.1)
Sodium: 139 mmol/L (ref 135–145)

## 2018-11-24 LAB — PHOSPHORUS: Phosphorus: 2.9 mg/dL (ref 2.5–4.6)

## 2018-11-24 LAB — GLUCOSE, CAPILLARY: Glucose-Capillary: 134 mg/dL — ABNORMAL HIGH (ref 70–99)

## 2018-11-24 MED ORDER — POTASSIUM CHLORIDE 20 MEQ/15ML (10%) PO SOLN
40.0000 meq | ORAL | Status: AC
  Administered 2018-11-24 (×2): 40 meq
  Filled 2018-11-24 (×2): qty 30

## 2018-11-24 MED ORDER — HEPARIN SOD (PORK) LOCK FLUSH 100 UNIT/ML IV SOLN
500.0000 [IU] | INTRAVENOUS | Status: AC | PRN
  Administered 2018-11-24: 500 [IU]

## 2018-11-24 MED ORDER — JEVITY 1.2 CAL PO LIQD: 474.0000 mL | Freq: Four times a day (QID) | ORAL | 0 refills | Status: AC

## 2018-11-24 MED ORDER — POTASSIUM CHLORIDE 20 MEQ/15ML (10%) PO SOLN: 40.0000 meq | ORAL | Status: DC

## 2018-11-24 NOTE — Discharge Instructions (Signed)
Gastrostomy Tube Home Guide, Adult  A gastrostomy tube, or G-tube, is a tube that is inserted through the abdomen into the stomach. The tube is used to give feedings and medicines when a person is unable to eat and drink enough on his or her own.  How to care for a G-tube  Supplies needed  · Saline solution or clean, warm water and soap.  · Cotton swab or gauze.  · Precut gauze bandage (dressing) and tape, if needed.  Instructions  1. Wash your hands with soap and water.  2. If there is a dressing between the person's skin and the tube, remove it.  3. Check the area where the tube enters the skin. Check for problems such as:  ? Redness.  ? Swelling.  ? Pus-like drainage.  ? Extra skin growth.  4. Moisten the cotton swab with the saline solution or soap and water mixture. Gently clean around the insertion site. Remove any drainage or crusting.  ? When the G-tube is first put in, a normal saline solution or water can be used to clean the skin.  ? Mild soap and warm water can be used when the skin around the G-tube site has healed.  5. If there should be a dressing between the person's skin and the tube, apply it at this time.  How to flush a G-tube  Flush the G-tube regularly to keep it from clogging. Flush it before and after feedings and as often as told by the health care provider.  Supplies needed  · Purified or sterile water, warmed. If the person has a weak disease-fighting (immune) system, or if he or she has difficulty fighting off infections (is immunocompromised), use only sterile water.  ? If you are unsure about the amount of chemical contaminants in purified or drinking water, use sterile water.  ? To purify drinking water by boiling:  § Boil water for at least 1 minute. Keep lid over water while it boils. Allow water to cool to room temperature before using.  · 60cc G-tube syringe.  Instructions  1. Wash your hands with soap and water.  2. Draw up 30 mL of warm water in a syringe.  3. Connect the syringe  to the tube.  4. Slowly and gently push the water into the tube.  G-tube problems and solutions  · If the tube comes out:  ? Cover the opening with a clean dressing and tape.  ? Call a health care provider right away.  ? A health care provider will need to put the tube back in within 4 hours.  · If there is skin or scar tissue growing where the tube enters the skin:  ? Keep the area clean and dry.  ? Secure the tube with tape so that the tube does not move around too much.  ? Call a health care provider.  · If the tube gets clogged:  ? Slowly push warm water into the tube with a large syringe.  ? Do not force the fluid into the tube or push an object into the tube.  ? If you are not able to unclog the tube, call a health care provider right away.  Follow these instructions at home:  Feedings  · Give feedings at room temperature.  · Cover and place unused feedings in the refrigerator.  · If feedings are continuous:  ? Do not put more than 4 hours worth of feedings in the feeding bag.  ? Stop the feedings   when you need to give medicine or flush the tube. Be sure to restart the feedings.  ? Make sure the person's head is above his or her stomach (upright position). This will prevent choking and discomfort.  · Replace feeding bags and syringes as told by the health care provider.  · Make sure the person is in the right position during and after feedings:  ? During feedings, the person's position should be in the upright position.  ? After a noncontinuous feeding (bolus feeding), have the person stay in the upright position for 1 hour.  General instructions  · Only use syringes made for G-tubes.  · Do not pull or put tension on the tube.  · Clamp the tube before removing the cap or disconnecting a syringe.  · Measure the length of the G-tube every day from the insertion site to the end of the tube.  · If the person's G-tube has a balloon, check the fluid in the balloon every week. The amount of fluid that should be in  the balloon can be found in the manufacturer’s specifications.  · Make sure the person takes care of his or her oral health, such as by brushing his or her teeth.  · Remove excess air from the G-tube as told by the person's health care provider. This is called "venting."  · Keep the area where the tube enters the skin clean and dry.  · Do not push feedings, medicines, or flushes rapidly.  Contact a health care provider if:  · The person with the tube has any of these problems:  ? Constipation.  ? Fever.  · There is a large amount of fluid or mucus-like liquid leaking from the tube.  · Skin or scar tissue appears to be growing where the tube enters the skin.  · The length of tube from the insertion site to the G-tube gets longer.  Get help right away if:  · The person with the tube has any of these problems:  ? Severe abdominal pain.  ? Severe tenderness.  ? Severe bloating.  ? Nausea.  ? Vomiting.  ? Trouble breathing.  ? Shortness of breath.  · Any of these problems happen in the area where the tube enters the skin:  ? Redness, irritation, swelling, or soreness.  ? Pus-like discharge.  ? A bad smell.  · The tube is clogged and cannot be flushed.  · The tube comes out.  Summary  · A gastrostomy tube, or G-tube, is a tube that is inserted through the abdomen into the stomach. The tube is used to give feedings and medicines when a person is unable to eat and drink enough on his or her own.  · Check and clean the insertion site daily as told by the person's health care provider.  · Flush the G-tube regularly to keep it from clogging. Flush it before and after feedings and as often as told by the person's health care provider.  · Keep the area where the tube enters the skin clean and dry.  This information is not intended to replace advice given to you by your health care provider. Make sure you discuss any questions you have with your health care provider.  Document Released: 12/21/2001 Document Revised: 04/27/2017  Document Reviewed: 12/07/2016  [employer] Interactive Patient Education © 2019 [employer]

## 2018-11-24 NOTE — Discharge Summary (Addendum)
Physician Discharge Summary  Lance Herring ZJI:[id number] DOB: [birth year] DOA: 11/19/2018  PCP: No primary care provider on file.  Admit date: 11/19/2018 Discharge date: 11/24/2018  Admitted From: Home Disposition:  Home  Recommendations for Outpatient Follow-up:  1. Follow up with PCP in 1 week, patient established with provider at [facility]  2. Follow up with Dr. Burr Medico  3. Follow up with IR for T tack removal in 2 weeks; they will arrange follow up  4. Please obtain BMP in 1 week to check potassium level. Hypokalemia replaced prior to discharge   Home Health: RN   Discharge Condition: Stable CODE STATUS: Full  Diet recommendation: Clear liquid diet, tube feed through G-tube   Brief/Interim Summary: Lance Herring is a 73 year old married male, retired Engineer, structural and worked for the [employer], has 2 houses and lived between [place] and [place], [place] of DM 2, HTN, stage III CKD, esophageal cancer diagnosed in 2018 and completed chemoradiation in [place] in June 2019, oncologist in [place], initially presented to [place] ED on evening of 1/23 due to syncope x2 and 2 days history of melena. Hemoglobin 6.5, potassium 2.5, initially transferred from [facility] to [facility] where he received 3 units PRBC transfusion, potassium replaced, underwent EGD for UGIB but unfortunately unable to get the scope past the GE junction due to tumor. He was then transferred to [facility] for CVTS consultation for consideration for surgery and GI consultation for consideration for esophageal stent. Oncology consulted 1/26. IR consulted for G-tube placement, placed on 1/28, and started on tube feeding 1/29.   Discharge Diagnoses:  Principal Problem:   UGIB (upper gastrointestinal bleed) Active Problems:   Acute blood loss anemia   Esophageal cancer (HCC)   DM2 (diabetes mellitus, type 2) (HCC)   HTN (hypertension)   CKD (chronic kidney disease) stage 3, GFR 30-59  ml/min (HCC)   Malnutrition of moderate degree  Acute upper GI bleed -Most likely due to friable esophageal cancer -EGD attempted at OSH but unable to get the scope past the GE junction due to tumor.   Biopsy revealed no malignant cells, will likely need repeat sampling -As per GI input here, the mass was friable and there is no maneuver that can be performed to arrest any bleeding. Also patient able to tolerate liquids, hence not end-stage, not completely obstructed and so no reason to place an esophageal stent.  Acute blood loss anemia -Secondary to upper GI bleed -Total 4 u pRBC transfusion  -Hgb stable 8.9 this morning   Syncope -Most likely due to symptomatic acute blood loss anemia. -Patient counseled that he should not drive for 6 months.   AKI on CKD stage 3  -Baseline creatinine not known -Presented with creatinine of 2.02, improved to 1.67   Relapsed esophageal cancer -Patient states that he saw his oncologist in [place] on 11/15/2018 at which time he forgot to mention about his dysphagia issues. He was supposed to follow-up in February with PET scan. -As per CVTS input, not candidate for esophagectomy -CT abdomen and chest without contrast from 1/26 appreciated: 10 mm pulmonary nodule left lung apex-isolated metastatic disease not excluded, and thickening of the distal esophagus likely consistent with known residual esophageal carcinoma, no high-grade esophageal obstruction or perforation, prior granulomatous disease of lungs. -Patient and spouse indicate that he now wishes to continue his cancer care locally. -Oncology consulted; plan for outpatient follow up with Dr. Burr Medico   Type II DM -Continue SSI  Elevated troponin -Suspect  demand ischemia from acute GI bleed and acute blood loss anemia. -Troponin peaked at 1.5, has trended down. No chest pain or acute EKG changes. -Had been seen by cardiology at OSH and felt also to be due to demand ischemia. -Not a  candidate for antiplatelets due to GI bleed.  Essential hypertension -Controlled.  Moderate protein calorie malnutrition -Dietitian consulted.Started TF    Discharge Instructions  Discharge Instructions    Call MD for:  difficulty breathing, headache or visual disturbances   Complete by:  As directed    Call MD for:  extreme fatigue   Complete by:  As directed    Call MD for:  hives   Complete by:  As directed    Call MD for:  persistant dizziness or light-headedness   Complete by:  As directed    Call MD for:  persistant nausea and vomiting   Complete by:  As directed    Call MD for:  redness, tenderness, or signs of infection (pain, swelling, redness, odor or green/yellow discharge around incision site)   Complete by:  As directed    Call MD for:  severe uncontrolled pain   Complete by:  As directed    Call MD for:  temperature >100.4   Complete by:  As directed    Discharge instructions   Complete by:  As directed    You were cared for by a hospitalist during your hospital stay. If you have any questions about your discharge medications or the care you received while you were in the hospital after you are discharged, you can call the unit and ask to speak with the hospitalist on call if the hospitalist that took care of you is not available. Once you are discharged, your primary care physician will handle any further medical issues. Please note that NO REFILLS for any discharge medications will be authorized once you are discharged, as it is imperative that you return to your primary care physician (or establish a relationship with a primary care physician if you do not have one) for your aftercare needs so that they can reassess your need for medications and monitor your lab values.   Discharge instructions   Complete by:  As directed    No driving for 6 months   Increase activity slowly   Complete by:  As directed      Allergies as of 11/24/2018   No Known Allergies      Medication List    STOP taking these medications   allopurinol 100 MG tablet Commonly known as:  ZYLOPRIM   chlorthalidone 25 MG tablet Commonly known as:  HYGROTON   labetalol 100 MG tablet Commonly known as:  NORMODYNE     TAKE these medications   amLODipine 5 MG tablet Commonly known as:  NORVASC Take 5 mg by mouth daily.   feeding supplement (JEVITY 1.2 CAL) Liqd Place 474 mLs into feeding tube 4 (four) times daily for 30 days.   glyBURIDE-metformin 5-500 MG tablet Commonly known as:  GLUCOVANCE Take 1 tablet by mouth 2 (two) times daily.   pioglitazone 45 MG tablet Commonly known as:  ACTOS Take 45 mg by mouth daily.      Follow-up Information    Sandi Mariscal, MD Follow up in 2 week(s).   Specialties:  Interventional Radiology, Radiology Why:  pt will hear from [facility] IR for appt for removal of G tube T tacks -- approx 2 weeks; call [phone] if any needss or questions Contact information:  [address] [place] [address] [phone]        [facility] Follow up.   Why:  Follow up with your new primary care physician as scheduled in February  Contact information: [address] [place] [address] [phone]        Truitt Merle, MD. Call.   Specialties:  Hematology, Oncology Why:  Call to make hospital follow up appointment as soon as able  Contact information: [place] [address] [phone]          No Known Allergies    Procedures/Studies: Ct Abdomen Wo Contrast  Result Date: 11/20/2018 CLINICAL DATA:  History of carcinoma of the distal esophagus at the GE junction with prior documentation of high-grade obstruction and inability to advance endoscope through the level of obstruction. Evaluation is now performed for potential gastrostomy tube placement. EXAM: CT CHEST AND ABDOMEN WITHOUT CONTRAST TECHNIQUE: Multidetector CT imaging of the chest and abdomen was performed following  the standard protocol without intravenous contrast. COMPARISON:  None. FINDINGS: CT CHEST FINDINGS WITHOUT CONTRAST Cardiovascular: The heart size is normal. No pericardial fluid. Normal caliber thoracic aorta and central pulmonary arteries. Port-A-Cath present via the right jugular vein with the catheter tip in the lower SVC. Mediastinum/Nodes: Calcified right paratracheal and right hilar lymph nodes present consistent with prior granulomatous disease. No enlarged lymph nodes identified. The proximal to mid esophagus is mildly dilated and contains air and fluid. Distal esophagus demonstrates thickening and measures approximately 2.5 cm in diameter. No enlarged paraesophageal lymph nodes are identified. No evidence of esophageal perforation. Lungs/Pleura: Noncalcified left apical lung nodule present in a subpleural location at the top of the left upper lobe measuring 9 mm in greatest transverse diameter on axial images and 10 mm in height on coronal reconstructions. Calcified anterior left upper lobe granuloma measures 5 mm. Additional 5 mm calcified granuloma is present in the right middle lobe just below the minor fissure. There is some scarring and mild bronchiectasis in the medial aspect of the right lower lobe abutting the posterior mediastinum. Part of this appearance may relate to prior radiation therapy. Musculoskeletal: No bony lesions or fractures identified. CT ABDOMEN FINDINGS WITHOUT CONTRAST Hepatobiliary: No focal liver abnormality is seen. No gallstones, gallbladder wall thickening, or biliary dilatation. Pancreas: Unremarkable. No pancreatic ductal dilatation or surrounding inflammatory changes. Spleen: Normal in size without focal abnormality. Adrenals/Urinary Tract: No adrenal masses. Probable central parapelvic cyst of the right kidney measuring 3 cm as there does not appear to be dilatation of the collecting system elsewhere in the right kidney. Stomach/Bowel: No hiatal hernia. The stomach is  relatively vertical and decompressed at the time of imaging. The proximal to mid stomach is predominantly posterior to a high splenic flexure and distal transverse colon. There is no colonic interposition between the distal stomach and the abdominal wall. No evidence of bowel obstruction or free air. No abnormal fluid collections or ascites. Vascular/Lymphatic: Calcified plaque in the abdominal aorta without evidence of abdominal aortic aneurysm. No enlarged abdominal lymph nodes identified. Other: No hernias or free fluid. Musculoskeletal: No bony lesions or fractures identified. IMPRESSION: 1. 10 mm pulmonary nodule at the left lung apex. Correlation with any prior outside imaging is recommended to determine whether this is a stable finding. Isolated metastatic disease cannot be excluded. 2. Thickening of the distal esophagus likely consistent with known residual esophageal carcinoma. No evidence of high-grade esophageal obstruction currently or perforation. No prominent paraesophageal lymph nodes are  identified in the chest. 3. Evidence of prior granulomatous disease with 2 separate right lung calcified granulomas and right paratracheal and hilar calcified lymph nodes. 4. The proximal to mid stomach lies posterior to a high splenic flexure and distal transverse colon in the supine position when decompressed. There is no colon interposed between the distal stomach and the abdominal wall. If enough esophageal lumen is present to allow catheter passage for air insufflation of the stomach, there were likely will be a percutaneous window available for gastrostomy tube placement with adequate distention of the stomach. An esophogram has been recommended to evaluate caliber of the esophageal lumen and will be performed later. Electronically Signed   By: Aletta Edouard M.D.   On: 11/20/2018 13:45   Ct Chest Wo Contrast  Result Date: 11/20/2018 CLINICAL DATA:  History of carcinoma of the distal esophagus at the GE  junction with prior documentation of high-grade obstruction and inability to advance endoscope through the level of obstruction. Evaluation is now performed for potential gastrostomy tube placement. EXAM: CT CHEST AND ABDOMEN WITHOUT CONTRAST TECHNIQUE: Multidetector CT imaging of the chest and abdomen was performed following the standard protocol without intravenous contrast. COMPARISON:  None. FINDINGS: CT CHEST FINDINGS WITHOUT CONTRAST Cardiovascular: The heart size is normal. No pericardial fluid. Normal caliber thoracic aorta and central pulmonary arteries. Port-A-Cath present via the right jugular vein with the catheter tip in the lower SVC. Mediastinum/Nodes: Calcified right paratracheal and right hilar lymph nodes present consistent with prior granulomatous disease. No enlarged lymph nodes identified. The proximal to mid esophagus is mildly dilated and contains air and fluid. Distal esophagus demonstrates thickening and measures approximately 2.5 cm in diameter. No enlarged paraesophageal lymph nodes are identified. No evidence of esophageal perforation. Lungs/Pleura: Noncalcified left apical lung nodule present in a subpleural location at the top of the left upper lobe measuring 9 mm in greatest transverse diameter on axial images and 10 mm in height on coronal reconstructions. Calcified anterior left upper lobe granuloma measures 5 mm. Additional 5 mm calcified granuloma is present in the right middle lobe just below the minor fissure. There is some scarring and mild bronchiectasis in the medial aspect of the right lower lobe abutting the posterior mediastinum. Part of this appearance may relate to prior radiation therapy. Musculoskeletal: No bony lesions or fractures identified. CT ABDOMEN FINDINGS WITHOUT CONTRAST Hepatobiliary: No focal liver abnormality is seen. No gallstones, gallbladder wall thickening, or biliary dilatation. Pancreas: Unremarkable. No pancreatic ductal dilatation or surrounding  inflammatory changes. Spleen: Normal in size without focal abnormality. Adrenals/Urinary Tract: No adrenal masses. Probable central parapelvic cyst of the right kidney measuring 3 cm as there does not appear to be dilatation of the collecting system elsewhere in the right kidney. Stomach/Bowel: No hiatal hernia. The stomach is relatively vertical and decompressed at the time of imaging. The proximal to mid stomach is predominantly posterior to a high splenic flexure and distal transverse colon. There is no colonic interposition between the distal stomach and the abdominal wall. No evidence of bowel obstruction or free air. No abnormal fluid collections or ascites. Vascular/Lymphatic: Calcified plaque in the abdominal aorta without evidence of abdominal aortic aneurysm. No enlarged abdominal lymph nodes identified. Other: No hernias or free fluid. Musculoskeletal: No bony lesions or fractures identified. IMPRESSION: 1. 10 mm pulmonary nodule at the left lung apex. Correlation with any prior outside imaging is recommended to determine whether this is a stable finding. Isolated metastatic disease cannot be excluded. 2. Thickening of the distal  esophagus likely consistent with known residual esophageal carcinoma. No evidence of high-grade esophageal obstruction currently or perforation. No prominent paraesophageal lymph nodes are identified in the chest. 3. Evidence of prior granulomatous disease with 2 separate right lung calcified granulomas and right paratracheal and hilar calcified lymph nodes. 4. The proximal to mid stomach lies posterior to a high splenic flexure and distal transverse colon in the supine position when decompressed. There is no colon interposed between the distal stomach and the abdominal wall. If enough esophageal lumen is present to allow catheter passage for air insufflation of the stomach, there were likely will be a percutaneous window available for gastrostomy tube placement with adequate  distention of the stomach. An esophogram has been recommended to evaluate caliber of the esophageal lumen and will be performed later. Electronically Signed   By: Aletta Edouard M.D.   On: 11/20/2018 13:45   Ir Gastrostomy Tube Mod Sed  Result Date: 11/22/2018 INDICATION: Recurrent esophageal cancer, now with high-grade obstruction involving the distal esophagus. Please perform image guided gastrostomy tube placement for enteric nutrition supplementation purposes. EXAM: PUSH GASTROSTOMY TUBE PLACEMENT COMPARISON:  CT the chest, abdomen pelvis - 11/20/2018; barium swallow examination-11/21/2018 MEDICATIONS: Ancef 2 gm IV; Antibiotics were administered within 1 hour of the procedure. CONTRAST:  10 mL of Isovue 300 administered into the gastric lumen. ANESTHESIA/SEDATION: Moderate (conscious) sedation was employed during this procedure. A total of Versed 1.5 mg and Fentanyl 5 mcg was administered intravenously. Moderate Sedation Time: 15 minutes. The patient's level of consciousness and vital signs were monitored continuously by radiology nursing throughout the procedure under my direct supervision. FLUOROSCOPY TIME:  2 minute, 42 seconds (16 mGy) COMPLICATIONS: None immediate. PROCEDURE: Informed written consent was obtained from the patient and the patient's wife following explanation of the procedure, risks, benefits and alternatives. A time out was performed prior to the initiation of the procedure. Ultrasound scanning was performed to demarcate the edge of the left lobe of the liver. Maximal barrier sterile technique utilized including caps, mask, sterile gowns, sterile gloves, large sterile drape, hand hygiene and Betadine prep. The left upper quadrant was sterilely prepped and draped. A oral gastric catheter was inserted into the stomach under fluoroscopy. The existing nasogastric feeding tube was removed. Given patient's nearly obstructing long segment distal esophageal lesion, the decision was made to  place a push gastrostomy tube. The left costal margin and air opacified transverse colon were identified and avoided. Air was injected into the stomach for insufflation and visualization under fluoroscopy. Under sterile conditions and local anesthesia, 3 T tacks were utilized to pexy the anterior aspect of the stomach against the ventral abdominal wall. Contrast injection confirmed appropriate positioning of each of the T tacks. An incision was made between the T tacks and a 17 gauge trocar needle was utilized to access the stomach. Needle position was confirmed within the stomach with aspiration of air and injection of a small amount of contrast. They are intermittent fluoroscopic guidance, the access needle was exchanged for multiple serial dilators ultimately allowing placement of a 20 French peel-away sheath. Next an 18-French balloon retention gastrostomy tube was inserted through the peel-away sheath and the peel-away sheath was removed. The retention balloon was insufflated with a mixture of dilute saline and contrast and pulled taut against the anterior wall of the stomach. The external disc was cinched. Contrast injection confirms positioning within the stomach. Several spot radiographic images were obtained in various obliquities for documentation. The patient tolerated procedure well without immediate post  procedural complication. FINDINGS: After successful fluoroscopic guided placement, the gastrostomy tube is appropriately positioned with internal retention balloon against the ventral aspect of the gastric lumen. IMPRESSION: Successful fluoroscopic insertion of an 79 French balloon retention gastrostomy tube. The gastrostomy may be used immediately for medication administration and in 24 hrs for the initiation of feeds. Electronically Signed   By: Sandi Mariscal M.D.   On: 11/22/2018 17:21   Dg Esophagus W Single Cm (sol Or Thin Ba)  Result Date: 11/21/2018 CLINICAL DATA:  Esophageal cancer, pre PEG  tube placement anatomical evaluation. EXAM: ESOPHOGRAM/BARIUM SWALLOW TECHNIQUE: Single contrast examination was performed using 50 cc Omnipaque 300. FLUOROSCOPY TIME:  Fluoroscopy Time:  1 minutes 12 seconds Radiation Exposure Index (if provided by the fluoroscopic device): Number of Acquired Spot Images: 4 COMPARISON:  CT chest 11/20/2018. FINDINGS: A limited examination was performed in the reclined LPO position. The distal esophagus is dilated. A long segment narrow stricture is seen at the gastroesophageal junction with trickling of contrast into the proximal stomach. IMPRESSION: Long segment high-grade stricture at the gastroesophageal junction, in this patient with known esophageal cancer. Electronically Signed   By: Lorin Picket M.D.   On: 11/21/2018 09:34       Discharge Exam: Vitals:   11/23/18 1923 11/24/18 0735  BP: (!) 142/69 125/68  Pulse: 61 77  Resp:  17  Temp: 98.6 F (37 C) 98.1 F (36.7 C)  SpO2: 100% 97%    General: Pt is alert, awake, not in acute distress Cardiovascular: RRR, S1/S2 +, no rubs, no gallops Respiratory: CTA bilaterally, no wheezing, no rhonchi Abdominal: Soft, NT, ND, bowel sounds + Extremities: no edema, no cyanosis    The results of significant diagnostics from this hospitalization (including imaging, microbiology, ancillary and laboratory) are listed below for reference.     Microbiology: Recent Results (from the past 240 hour(s))  MRSA PCR Screening     Status: None   Collection Time: 11/19/18  3:20 AM  Result Value Ref Range Status   MRSA by PCR NEGATIVE NEGATIVE Final    Comment:        The GeneXpert MRSA Assay (FDA approved for NASAL specimens only), is one component of a comprehensive MRSA colonization surveillance program. It is not intended to diagnose MRSA infection nor to guide or monitor treatment for MRSA infections. Performed at [facility], [place] [address] [place], [place] [address]      Labs: BNP (last 3  results) No results for input(s): BNP in the last 8760 hours. Basic Metabolic Panel: Recent Labs  Lab 11/20/18 0858 11/20/18 1626 11/21/18 0624 11/22/18 0422 11/23/18 0441 11/24/18 0449  NA  --  138 137 139 138 139  K  --  3.4* 3.3* 3.7 3.7 3.2*  CL  --  102 101 105 106 106  CO2  --  27 26 26 23 26   GLUCOSE  --  133* 97 94 85 160*  BUN  --  20 15 10 9 8   CREATININE  --  1.85* 1.75* 1.62* 1.58* 1.67*  CALCIUM  --  8.6* 8.3* 8.1* 8.3* 8.0*  MG 2.0  --   --   --   --  1.9  PHOS  --   --   --   --   --  2.9   Liver Function Tests: Recent Labs  Lab 11/19/18 0902  AST 22  ALT 11  ALKPHOS 55  BILITOT 1.0  PROT 5.6*  ALBUMIN 2.7*   No  results for input(s): LIPASE, AMYLASE in the last 168 hours. No results for input(s): AMMONIA in the last 168 hours. CBC: Recent Labs  Lab 11/21/18 0624 11/22/18 0422 11/22/18 1807 11/23/18 0441 11/24/18 0449  WBC 9.3 8.5 13.4* 9.5 6.8  HGB 7.7* 7.5* 9.8* 8.8* 8.9*  HCT 23.5* 23.3* 29.9* 27.1* 26.7*  MCV 92.2 92.8 89.0 89.7 89.3  PLT 238 254 303 294 303   Cardiac Enzymes: Recent Labs  Lab 11/19/18 0902 11/19/18 1742  TROPONINI 0.80* 0.51*   BNP: Invalid input(s): POCBNP CBG: Recent Labs  Lab 11/23/18 0737 11/23/18 1134 11/23/18 1654 11/23/18 2118 11/24/18 0752  GLUCAP 70 103* 118* 127* 134*   D-Dimer No results for input(s): DDIMER in the last 72 hours. Hgb A1c No results for input(s): HGBA1C in the last 72 hours. Lipid Profile No results for input(s): CHOL, HDL, LDLCALC, TRIG, CHOLHDL, LDLDIRECT in the last 72 hours. Thyroid function studies No results for input(s): TSH, T4TOTAL, T3FREE, THYROIDAB in the last 72 hours.  Invalid input(s): FREET3 Anemia work up No results for input(s): VITAMINB12, FOLATE, FERRITIN, TIBC, IRON, RETICCTPCT in the last 72 hours. Urinalysis No results found for: COLORURINE, APPEARANCEUR, [place], [place], [place], [place], [place], [place], PROTEINUR, UROBILINOGEN, NITRITE,  LEUKOCYTESUR Sepsis Labs Invalid input(s): PROCALCITONIN,  WBC,  LACTICIDVEN Microbiology Recent Results (from the past 240 hour(s))  MRSA PCR Screening     Status: None   Collection Time: 11/19/18  3:20 AM  Result Value Ref Range Status   MRSA by PCR NEGATIVE NEGATIVE Final    Comment:        The GeneXpert MRSA Assay (FDA approved for NASAL specimens only), is one component of a comprehensive MRSA colonization surveillance program. It is not intended to diagnose MRSA infection nor to guide or monitor treatment for MRSA infections. Performed at [facility], [place] [address], [place], [place] [address]      Patient was seen and examined on the day of discharge and was found to be in stable condition. Time coordinating discharge: 35 minutes including assessment and coordination of care, as well as examination of the patient.   SIGNED:  Dessa Phi, DO [facility] [email or web address] 11/24/2018, 9:44 AM

## 2018-11-24 NOTE — Care Management Note (Signed)
Case Management Note  Patient Details  Name: Lance Herring MRN: [id number] Date of Birth: [birth year]  Subjective/Objective:    Pt transferred from [facility] for GI esophageal stent consideration             Action/Plan:   PTA independent from home with wife.  Pts wife informed CM that pt has PCP and denied barriers with paying for medications as prescribed.   Pt scheduled for Peg tube placement 11/22/18   Expected Discharge Date:  11/24/18               Expected Discharge Plan:  [place]  In-House Referral:     Discharge planning Services  CM Consult  Post Acute Care Choice:    Choice offered to:     DME Arranged:    DME Agency:     HH Arranged:    HH Agency:     Status of Service:  In process, will continue to follow  If discussed at Long Length of Stay Meetings, dates discussed:    Additional Comments: 11/24/2018  Pt deemed stable for discharge home today - pt will travel home via private vehicle driven by wife.  [facility] accepted referral for tube feeds - [facility] informed pt will discharge home today - agency confirmed that they will deliver medications to the home today [facility] confirms the discharge address).  [facility] also informed that pt will discharge home today.  No other CM needs identified  11/22/18 CM informed that pts PCP is actually in [place] - pt needs PCP in his local area.  Pt has active insurance - declined for CM to set up clinic appt.  CM provided pt [facility] and also provided option for pt to contact insurance company for assistance in locating local PCP    11/21/2018 Pt had previous peg tube removed during the summer months of 2019.  Wife can not recall the supplier of the equipment nor the [facility] agency - wife informed CM that [facility] and DME was arranged while stay in [facility].  Wife declined for CM to contact PCP (Dr Marvis Repress and requested CM to arrange [facility] with a new company from [employer].gov list.  Wife chose [facility] for peg tub  supplies and feeds. CM provided medicare.gov list.  Pts address at discharge will be [place], [place].    Pt chose [facility] for [facility] - agency contacted and tentative referral accepted pending orders   Pts wife chose [facility] for tube feed and supplies - agency accepted referral..  Attending will write tube feed orders once tube feeds have been tolerated.  CM requested attending to write [place] orders.   -Maryclare Labrador, RN 11/24/2018, 10:45 AM

## 2018-11-24 NOTE — Plan of Care (Signed)
  Problem: Nutrition: Goal: Adequate nutrition will be maintained Outcome: Progressing   Problem: Safety: Goal: Ability to remain free from injury will improve Outcome: Progressing   

## 2018-11-25 ENCOUNTER — Encounter: Payer: Self-pay | Admitting: Genetics

## 2018-12-01 ENCOUNTER — Encounter: Payer: Self-pay | Admitting: Oncology

## 2018-12-01 NOTE — Progress Notes (Signed)
[facility] in [place] for medical records, path report will be faxed all others a request was faxed to [phone].

## 2018-12-05 ENCOUNTER — Telehealth: Payer: Self-pay

## 2018-12-05 ENCOUNTER — Encounter (HOSPITAL_COMMUNITY): Payer: Self-pay

## 2018-12-05 ENCOUNTER — Inpatient Hospital Stay: Payer: Medicare Other | Attending: Oncology | Admitting: Oncology

## 2018-12-05 ENCOUNTER — Telehealth: Payer: Self-pay | Admitting: Oncology

## 2018-12-05 ENCOUNTER — Ambulatory Visit (HOSPITAL_COMMUNITY)
Admit: 2018-12-05 | Discharge: 2018-12-05 | Disposition: A | Discharge status: Home / Self Care | Payer: Medicare Other | Attending: Radiology | Admitting: Radiology

## 2018-12-05 VITALS — BP 130/72 | HR 87 | Temp 98.2°F | Resp 19 | Ht 71.0 in | Wt 184.5 lb

## 2018-12-05 DIAGNOSIS — M109 Gout, unspecified: Secondary | ICD-10-CM

## 2018-12-05 DIAGNOSIS — C155 Malignant neoplasm of lower third of esophagus: Secondary | ICD-10-CM | POA: Insufficient documentation

## 2018-12-05 DIAGNOSIS — N189 Chronic kidney disease, unspecified: Secondary | ICD-10-CM

## 2018-12-05 DIAGNOSIS — E440 Moderate protein-calorie malnutrition: Secondary | ICD-10-CM

## 2018-12-05 DIAGNOSIS — R911 Solitary pulmonary nodule: Secondary | ICD-10-CM

## 2018-12-05 DIAGNOSIS — I129 Hypertensive chronic kidney disease with stage 1 through stage 4 chronic kidney disease, or unspecified chronic kidney disease: Secondary | ICD-10-CM | POA: Insufficient documentation

## 2018-12-05 DIAGNOSIS — E1122 Type 2 diabetes mellitus with diabetic chronic kidney disease: Secondary | ICD-10-CM | POA: Insufficient documentation

## 2018-12-05 DIAGNOSIS — Z87891 Personal history of nicotine dependence: Secondary | ICD-10-CM | POA: Insufficient documentation

## 2018-12-05 DIAGNOSIS — C159 Malignant neoplasm of esophagus, unspecified: Secondary | ICD-10-CM

## 2018-12-05 NOTE — Telephone Encounter (Signed)
-----   Message from Milus Banister, MD sent at 12/05/2018  3:31 PM EST ----- Regarding: RE: New patient needs Upper Endo and BX I can do EGD this Wednesday. Please let him know we'll be contacting him to schedule it. thanks  Shenetta Schnackenberg, He needs EGD at [facility] this Wednesday, I think I have 2 openings still.  For esopahgeal cancer, biospy.    Thanks ----- Message ----- From: Arna Snipe, RN Sent: 12/05/2018   3:06 PM EST To: Milus Banister, MD, Ladell Pier, MD, # Subject: New patient needs Upper Endo and BX            Hello Docs,  Dr. Benay Spice is seeing this new patient in clinic this afternoon.   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  Hemoglobin 6.5, potassium 2.5, initially transferred from [facility] to [place] where he received 3 units PRBC transfusion, potassium replaced, underwent EGD for UGIB but unfortunately unable to get the scope past the GE junction due to tumor.  He was then transferred to [facility] for CVTS consultation for consideration for surgery and GI consultation for consideration for esophageal stent.   Dr. Benay Spice needs tissue diagnosis (upper endo-bx) and wants to know if either of you can see him soon.   Thank you.  Dawn

## 2018-12-05 NOTE — Patient Instructions (Signed)
Please provide a copy of your Medical Advanced Directive/Living Will to have scanned into your record. 

## 2018-12-05 NOTE — Progress Notes (Signed)
Patient ID: Lance Herring, male   DOB: 05-03-1946, 73 y.o.   MRN: [id number] 3T tacks surrounding patient's gastrostomy tube were removed in their entirety.  Insertion site clean and dry.  Gauze dressing applied over site.  No immediate complications.

## 2018-12-05 NOTE — Telephone Encounter (Signed)
Called patient and left VM  Letting them know that [facility] will be calling them to schedule the BX for 12/07/18. I provided [facility]'s phone number and they have my direct contact for questions or concerns.

## 2018-12-05 NOTE — Telephone Encounter (Signed)
Left message on machine to call back  

## 2018-12-05 NOTE — Telephone Encounter (Signed)
appt made for 1030 am on 2/12. Left message on machine to call back

## 2018-12-05 NOTE — Progress Notes (Signed)
[facility] New Patient Consult   Requesting MD: Clark Cuff 74 y.o.  1946-01-02    Reason for Consult: Esophagus cancer   HPI: Lance Herring presented with solid dysphasia and weight loss in the spring 2018.  He underwent an upper endoscopy 02/01/2017.  This confirmed a 3 cm mass at the GE junction with stigmata of recent bleeding.  The mass caused partial obstruction.  A biopsy revealed moderately differentiated adenocarcinoma.  A colonoscopy the same day revealed multiple polyps.  These were removed and negative for dysplasia or malignancy.  CTs of the chest, abdomen, and pelvis 03/06/2017 revealed a distal esophagus mass and a single noncalcified pulmonary nodule in the medial aspect of the left lower lobe with evidence of prior granulomatous disease.  There was a right adrenal adenoma and an indeterminate nodule in the left adrenal gland.  A gastrostomy tube was placed.  A staging PET scan 04/07/2017 revealed a hypermetabolic distal esophageal mass the right adrenal lesion was not hypermetabolic.  He was diagnosed with a clinical T3 N0 M0 lesion.  He was treated with concurrent weekly Taxol/carboplatin and radiation.  Radiation was given in 28 fractions to a dose of 50.4 Gy from 04/20/2017 through 05/28/2017.  He reports being in clinical remission after treatment was completed.  The gastrostomy feeding tube was removed in May 2019.  A CT evaluation 07/05/2017 revealed improvement in the distal esophagus mass.  An upper endoscopy 07/26/2017 revealed ulceration and friability of the mucosa in the GE junction extending up to 35 cm.  A 2 cm mass was noted at the GE junction, malignant appearing causing a partial obstruction but improved on the previous EGD.  A biopsy revealed no malignancy.  A staging PET scan 08/19/2017 field hypermetabolic activity at the distal esophagus and GE junction.  He was referred to thoracic surgery (I do not have records from the thoracic  surgery consultation available today).  Lance Herring recently relocated to [place].  He had a syncope event 11/18/2018 and presented to the [facility] emergency room room.  He reported a 2-day history of black stool.  He was noted to have a hemoglobin of 6.5.  He was transfused 3 units of packed red blood cells and the hemoglobin came up to 9.  An upper endoscopy revealed obstruction of the lumen at 40 cm.  The scope could not pass this area.  2 biopsies were obtained.  The pathology revealed inflammation.  No malignancy.  He was transferred to [facility] on 11/19/2018 consider placement esophageal stent.  Interventional radiology placed a gastrostomy tube 11/22/2018.  CTs of the chest, abdomen, and pelvis on 11/20/2018 revealed a 10 mm pulmonary nodule of the left lung apex, thickening of the distal esophagus, no paraesophageal lymph nodes, prior granulomatous disease with calcified right lung nodules and right paratracheal and hilar lymph nodes.  No focal liver abnormality.  He reports solid dysphasia since Christmas of 2019.  He is tolerating liquids.  He has gained weight since beginning Jevity tube feedings.  He was transfused 1 additional unit of packed red blood cells 11/22/2018.  He was evaluated by Dr. Servando Snare while in the hospital.  He was discharged to home 11/24/2018.   Past Medical History:  Diagnosis Date  . CKD (chronic kidney disease) stage 3, GFR 30-59 ml/min (HCC)   . DM2 (diabetes mellitus, type 2) ([place])   . 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 Gout   .  HTN (hypertension)     Past Surgical History:  Procedure Laterality Date  . IR GASTROSTOMY TUBE MOD SED  11/22/2018  . ORCHIECTOMY Right    Benign    Medications: Reviewed  Allergies: No Known Allergies  Family history: No family history of cancer  Social History:   He lives with his wife in [place].  He is retired Engineer, structural.  He quit smoking cigarettes in 1980.   He reports moderate alcohol use.  No risk factor for HIV or hepatitis.  ROS:   Positives include: Solid dysphasia, 12 pound weight loss prior to placement of the feeding gastrostomy tube  A complete ROS was otherwise negative.  Physical Exam:  Blood pressure 130/72, pulse 87, temperature 98.2 F (36.8 C), temperature source [place], resp. rate 19, height 5\' 11"  (1.803 m), weight 184 lb 8 oz (83.7 kg), SpO2 100 %.  HEENT: Partial upper denture plate, oropharynx without visible mass, neck without mass Lungs: Clear bilaterally Cardiac: Regular rate and rhythm Abdomen: No hepatosplenomegaly, left upper quadrant feeding tube, no mass GU: Testicle without mass,?  Hydrocele Vascular: No leg edema Lymph nodes: No cervical, supraclavicular, axillary, or inguinal nodes Neurologic: Alert and oriented, the motor exam appears intact in the upper and lower extremities bilaterally Skin: No rash Musculoskeletal: No spine tenderness   LAB:  CBC  Lab Results  Component Value Date   WBC 6.8 11/24/2018   HGB 8.9 (L) 11/24/2018   HCT 26.7 (L) 11/24/2018   MCV 89.3 11/24/2018   PLT 303 11/24/2018        CMP  Lab Results  Component Value Date   NA 139 11/24/2018   K 3.2 (L) 11/24/2018   CL 106 11/24/2018   CO2 26 11/24/2018   GLUCOSE 160 (H) 11/24/2018   BUN 8 11/24/2018   CREATININE 1.67 (H) 11/24/2018   CALCIUM 8.0 (L) 11/24/2018   PROT 5.6 (L) 11/19/2018   ALBUMIN 2.7 (L) 11/19/2018   AST 22 11/19/2018   ALT 11 11/19/2018   ALKPHOS 55 11/19/2018   BILITOT 1.0 11/19/2018   GFRNONAA 41 (L) 11/24/2018   GFRAA 47 (L) 11/24/2018     Imaging:  CT images from 11/20/2018-images reviewed with Lance Herring and his wife   Assessment/Plan:   1. 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  The right adrenal mass on CT was not hypermetabolic and favored to be an adenoma  Radiation and concurrent weekly Taxol/carboplatin 04/20/2017- 05/28/2017  CTs 07/05/2018-improvement in esophagus mass  Endoscopy 07/26/2017-ulceration and friability from the GE junction up to 35 cm, 2 cm mass at the GE junction-improved in size, biopsy revealed no malignancy  PET scan 81/19/1478- hypermetabolic activity at the distal esophagus and GE junction, no evidence of distant metastatic disease  Admission to [facility] 11/18/2018 with a syncope event secondary to upper GI bleeding  Upper endoscopy 11/18/2018- obstructing mass at 40 cm, biopsies revealed inflammation and no malignancy  CTs 11/20/2018- 10 mm nodule at the left lung apex, thickening at the distal esophagus, evidence of prior granulomatous disease with calcified right lung nodules and right paratracheal/hilar lymph nodes 2. Upper GI bleeding secondary to #1, 11/18/2018-transfused with  a total of 4 units of packed red blood cells 3. Nutrition- placement of gastrostomy feeding tube 11/22/2018  Feeding tube placed initial treatment in 2018, removed May 2019  4.   Diabetes 5.   Hypertension 6.   Gout 7.   Chronic renal failure   Disposition:   Lance Herring was diagnosed with localized to gastroesophageal cancer in April 2018.  He was treated with concurrent chemotherapy and radiation and entered clinical remission.  There has been no confirmation of distant metastatic disease.  It is unclear whether he underwent surgical evaluation following completion of chemotherapy/radiation.  He was admitted 11/18/2018 with a syncope event and severe anemia.  The anemia appears to be related to bleeding  from the distal esophagus/GE junction where there is an obstructing mass.  A biopsy of the mass 11/18/2018 was negative for malignancy.  Lance Herring appears well today.  There is no clinical evidence of distant metastatic disease.  The CTs 11/20/2018 reveal a left lung nodule.  A noncalcified lung nodule was also seen on the staging evaluation in 2018.  I reviewed the current CT images and discussed a treatment plan with Lance Herring and his wife.  I recommend a repeat upper endoscopy/biopsy to confirm persistent/recurrent disease at the GE junction.  I will also refer him for a staging PET scan.  He will return for an office visit in approximately 2 weeks.  We will consider surgical, radiation, and systemic therapy options based on the biopsy and PET scan results.    Betsy Coder, MD  12/05/2018, 2:30 PM

## 2018-12-05 NOTE — Progress Notes (Signed)
  Oncology Nurse Navigator Documentation  Navigator Location: [facility] (12/05/18 1518) Referral date to RadOnc/MedOnc: 11/24/18 (12/05/18 1518) )Navigator Encounter Type: Initial MedOnc (12/05/18 1518)   Met with patients to introduce role of GI navigator and to provided contact information for treatment team. Abnormal Finding Date: 11/20/18 (12/05/18 1518)                   Treatment Phase: Abnormal Scans (12/05/18 1518) Barriers/Navigation Needs: Coordination of Care (12/05/18 1518)  Sent staff message to Dr. Rush Landmark and Ardis Hughs requesting urgent consult. Interventions: Coordination of Care;Psycho-social support (12/05/18 1518)   Coordination of Care: EUS([facility] request for BX) (12/05/18 1518)    Support Groups/Services: Friends and Family (12/05/18 1518)   Acuity: Level 2 (12/05/18 1518)   Acuity Level 2: Initial guidance, education and coordination as needed;Assistance expediting appointments (12/05/18 1518)     Time Spent with Patient: 30 (12/05/18 1518)

## 2018-12-05 NOTE — Telephone Encounter (Signed)
Scheduled appt per 02/10 los. ° °Printed calendar and avs. °

## 2018-12-06 ENCOUNTER — Other Ambulatory Visit: Payer: Self-pay | Admitting: *Deleted

## 2018-12-06 ENCOUNTER — Telehealth: Payer: Self-pay | Admitting: *Deleted

## 2018-12-06 DIAGNOSIS — C155 Malignant neoplasm of lower third of esophagus: Secondary | ICD-10-CM

## 2018-12-06 NOTE — Telephone Encounter (Signed)
-----   Message from Milus Banister, MD sent at 12/05/2018  9:03 PM EST ----- Regarding: RE: New patient needs Upper Endo and BX Probably best if we have that drawn Tuesday, to make sure he' drifted too low for safe EGD on Wednesday as an outpatient.    Duong Haydel, Can you have him get cbc Tuesday (tomorrow).  Thanks  ----- Message ----- From: Ladell Pier, MD Sent: 12/05/2018   4:42 PM EST To: Milus Banister, MD Subject: RE: New patient needs Upper Endo and BX        Thanks, can you get a repeat cbc Wednesday, admitted with GI bleed 2 weeks ago ----- Message ----- From: Milus Banister, MD Sent: 12/05/2018   3:31 PM EST To: Timothy Lasso, RN, Arna Snipe, RN, # Subject: RE: New patient needs Upper Endo and BX        I can do EGD this Wednesday. Please let him know we'll be contacting him to schedule it. thanks  Jarian Longoria, He needs EGD at [facility] this Wednesday, I think I have 2 openings still.  For esopahgeal cancer, biospy.    Thanks ----- Message ----- From: Arna Snipe, RN Sent: 12/05/2018   3:06 PM EST To: Milus Banister, MD, Ladell Pier, MD, # Subject: New patient needs Upper Endo and BX            Hello Docs,  Dr. Benay Spice is seeing this new patient in clinic this afternoon.   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  Hemoglobin 6.5, potassium 2.5, initially transferred from [facility] to [place] where he received 3 units PRBC transfusion, potassium replaced, underwent EGD for UGIB but unfortunately unable to get the scope past the GE junction due to tumor.  He was then transferred to [facility] for CVTS  consultation for consideration for surgery and GI consultation for consideration for esophageal stent.   Dr. Benay Spice needs tissue diagnosis (upper endo-bx) and wants to know if either of you can see him soon.   Thank you.  Dawn

## 2018-12-06 NOTE — Telephone Encounter (Signed)
The pt was advised and will come in for labs today

## 2018-12-06 NOTE — Telephone Encounter (Signed)
Called patient to come in on 2/12 at 0845 for CBC/diff and hold blood bank and explained rationale for this. He understands and agrees.

## 2018-12-07 ENCOUNTER — Inpatient Hospital Stay: Payer: Medicare Other

## 2018-12-07 ENCOUNTER — Encounter: Payer: Self-pay | Admitting: Gastroenterology

## 2018-12-07 ENCOUNTER — Ambulatory Visit (AMBULATORY_SURGERY_CENTER): Payer: Medicare Other | Admitting: Gastroenterology

## 2018-12-07 ENCOUNTER — Telehealth: Payer: Self-pay | Admitting: *Deleted

## 2018-12-07 VITALS — BP 129/75 | HR 88 | Temp 97.7°F | Resp 13 | Ht 71.0 in | Wt 184.0 lb

## 2018-12-07 DIAGNOSIS — Z87891 Personal history of nicotine dependence: Secondary | ICD-10-CM | POA: Diagnosis not present

## 2018-12-07 DIAGNOSIS — M109 Gout, unspecified: Secondary | ICD-10-CM | POA: Diagnosis not present

## 2018-12-07 DIAGNOSIS — K222 Esophageal obstruction: Secondary | ICD-10-CM | POA: Diagnosis not present

## 2018-12-07 DIAGNOSIS — C155 Malignant neoplasm of lower third of esophagus: Secondary | ICD-10-CM

## 2018-12-07 DIAGNOSIS — E1122 Type 2 diabetes mellitus with diabetic chronic kidney disease: Secondary | ICD-10-CM | POA: Diagnosis not present

## 2018-12-07 DIAGNOSIS — I129 Hypertensive chronic kidney disease with stage 1 through stage 4 chronic kidney disease, or unspecified chronic kidney disease: Secondary | ICD-10-CM | POA: Diagnosis not present

## 2018-12-07 DIAGNOSIS — N189 Chronic kidney disease, unspecified: Secondary | ICD-10-CM | POA: Diagnosis not present

## 2018-12-07 LAB — CBC WITH DIFFERENTIAL (CANCER CENTER ONLY)
Abs Immature Granulocytes: 0.03 10*3/uL (ref 0.00–0.07)
Basophils Absolute: 0 10*3/uL (ref 0.0–0.1)
Basophils Relative: 0 %
Eosinophils Absolute: 0.1 10*3/uL (ref 0.0–0.5)
Eosinophils Relative: 2 %
HCT: 29.7 % — ABNORMAL LOW (ref 39.0–52.0)
HEMOGLOBIN: 9.4 g/dL — AB (ref 13.0–17.0)
Immature Granulocytes: 0 %
Lymphocytes Relative: 9 %
Lymphs Abs: 0.7 10*3/uL (ref 0.7–4.0)
MCH: 29.4 pg (ref 26.0–34.0)
MCHC: 31.6 g/dL (ref 30.0–36.0)
MCV: 92.8 fL (ref 80.0–100.0)
Monocytes Absolute: 0.9 10*3/uL (ref 0.1–1.0)
Monocytes Relative: 11 %
Neutro Abs: 6.2 10*3/uL (ref 1.7–7.7)
Neutrophils Relative %: 78 %
Platelet Count: 283 10*3/uL (ref 150–400)
RBC: 3.2 MIL/uL — ABNORMAL LOW (ref 4.22–5.81)
RDW: 17.9 % — ABNORMAL HIGH (ref 11.5–15.5)
WBC Count: 8.1 10*3/uL (ref 4.0–10.5)
nRBC: 0 % (ref 0.0–0.2)

## 2018-12-07 LAB — SAMPLE TO BLOOD BANK

## 2018-12-07 MED ORDER — SODIUM CHLORIDE 0.9 % IV SOLN: 500.0000 mL | Freq: Once | INTRAVENOUS | Status: DC

## 2018-12-07 NOTE — Telephone Encounter (Signed)
Admitted to home health on 11/25/18 from hospital for GI bleed. Patient reports no PCP. Asking if Dr. Benay Spice will be the attending for all his home health orders?

## 2018-12-07 NOTE — Progress Notes (Signed)
Called to room to assist during endoscopic procedure.  Patient ID and intended procedure confirmed with present staff. Received instructions for my participation in the procedure from the performing physician.  

## 2018-12-07 NOTE — Progress Notes (Signed)
Per Dr. Ardis Hughs send specimen RUSH. maw

## 2018-12-07 NOTE — Patient Instructions (Signed)
YOU HAD AN ENDOSCOPIC PROCEDURE TODAY AT THE [facility]:   Refer to the procedure report that was given to you for any specific questions about what was found during the examination.  If the procedure report does not answer your questions, please call your gastroenterologist to clarify.  If you requested that your care partner not be given the details of your procedure findings, then the procedure report has been included in a sealed envelope for you to review at your convenience later.  YOU SHOULD EXPECT: Some feelings of bloating in the abdomen. Passage of more gas than usual.  Walking can help get rid of the air that was put into your GI tract during the procedure and reduce the bloating. If you had a lower endoscopy (such as a colonoscopy or flexible sigmoidoscopy) you may notice spotting of blood in your stool or on the toilet paper. If you underwent a bowel prep for your procedure, you may not have a normal bowel movement for a few days.  Please Note:  You might notice some irritation and congestion in your nose or some drainage.  This is from the oxygen used during your procedure.  There is no need for concern and it should clear up in a day or so.  SYMPTOMS TO REPORT IMMEDIATELY:    Following upper endoscopy (EGD)  Vomiting of blood or coffee ground material  New chest pain or pain under the shoulder blades  Painful or persistently difficult swallowing  New shortness of breath  Fever of 100F or higher  Black, tarry-looking stools  For urgent or emergent issues, a gastroenterologist can be reached at any hour by calling [phone].    DIET:  We do recommend a small meal at first, but then you may proceed to your regular diet.  Drink plenty of fluids but you should avoid alcoholic beverages for 24 hours.  ACTIVITY:  You should plan to take it easy for the rest of today and you should NOT DRIVE or use heavy machinery until tomorrow (because of the sedation medicines  used during the test).    FOLLOW UP: Our staff will call the number listed on your records the next business day following your procedure to check on you and address any questions or concerns that you may have regarding the information given to you following your procedure. If we do not reach you, we will leave a message.  However, if you are feeling well and you are not experiencing any problems, there is no need to return our call.  We will assume that you have returned to your regular daily activities without incident.  If any biopsies were taken you will be contacted by phone or by letter within the next 1-3 weeks.  Please call us at [phone] if you have not heard about the biopsies in 3 weeks.    SIGNATURES/CONFIDENTIALITY: You and/or your care partner have signed paperwork which will be entered into your electronic medical record.  These signatures attest to the fact that that the information above on your After Visit Summary has been reviewed and is understood.  Full responsibility of the confidentiality of this discharge information lies with you and/or your care-partner.  Thank you for letting us take care of your healthcare needs today. 

## 2018-12-07 NOTE — Op Note (Addendum)
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 Benay Spice for repeat                            EGD to repeat biopsies of the GE junction; has                            tight distal esophagus stricture, s/p IR placed G                            tube 2 weeks ago. Medicines:                Monitored Anesthesia Care Procedure:                Pre-Anesthesia Assessment:                           - Prior to the procedure, a History and Physical                            was performed, and patient medications and                            allergies were reviewed. The patient's tolerance of                            previous anesthesia was also reviewed. The risks                            and benefits of the procedure and the sedation                            options and risks were discussed with the patient.                            All questions were answered, and informed consent                            was obtained. Prior Anticoagulants: The patient has                            taken no previous anticoagulant or antiplatelet  agents. ASA Grade Assessment: II - A patient with                            mild systemic disease. After reviewing the risks                            and benefits, the patient was deemed in                            satisfactory condition to undergo the procedure.                           After obtaining informed consent, the endoscope was                            passed under direct vision.  Throughout the                            procedure, the patient's blood pressure, pulse, and                            oxygen saturations were monitored continuously. The                            Endoscope was introduced through the mouth, and                            advanced to the lower third of esophagus. The upper                            GI endoscopy was accomplished without difficulty.                            The patient tolerated the procedure well. Scope In: Scope Out: Findings:                 Pin point stricture in the distal esophagus (39cm                            from incisors), lumen was 78mm. The mucosa at the                            site was not overtly neoplastic. I biospied the                            mucosa just proximal to the pin point stricture and                            also from within the stricture extensively.                           The stomach and duodenum were not examined due to  the very tight stricture described above.                           The exam was otherwise without abnormality. Complications:            No immediate complications. Estimated blood loss:                            None. Estimated Blood Loss:     Estimated blood loss: none. 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 The mucosa at the site was not overtly                            neoplastic. I biospied the mucosa just proximal to                            the pin point stricture and also from within the                            stricture extensively.                           - The examination was otherwise normal. Recommendation:           - Patient has a contact number available for                            emergencies. The signs and symptoms of potential                             delayed complications were discussed with the                            patient. Return to normal activities tomorrow.                            Written discharge instructions were provided to the                            patient.                           - Resume previous diet.                           - Continue present medications.                           - Await pathology results (sent "rush") Milus Banister, MD  12/07/2018 10:31:42 AM This report has been signed electronically.

## 2018-12-07 NOTE — Progress Notes (Signed)
Report to PACU, RN, vss, BBS= Clear.  

## 2018-12-08 ENCOUNTER — Telehealth: Payer: Self-pay | Admitting: *Deleted

## 2018-12-08 NOTE — Telephone Encounter (Signed)
  Follow up Call-  Call back number 12/07/2018  Post procedure Call Back phone  # [phone]  Permission to leave phone message Yes     Patient questions:  Do you have a fever, pain , or abdominal swelling? No. Pain Score  0 *  Have you tolerated food without any problems? Yes.    Have you been able to return to your normal activities? Yes.    Do you have any questions about your discharge instructions: Diet   No. Medications  No. Follow up visit  No.  Do you have questions or concerns about your Care? No.  Actions: * If pain score is 4 or above: No action needed, pain <4.

## 2018-12-15 ENCOUNTER — Telehealth: Payer: Self-pay | Admitting: *Deleted

## 2018-12-15 NOTE — Telephone Encounter (Signed)
Confirmed with wife that he now has a PCP in [place], [place]; Dr. Caryl Bis. Confirmed practice and home health agency is aware of MD to follow and sign his home health orders.

## 2018-12-19 ENCOUNTER — Ambulatory Visit (HOSPITAL_COMMUNITY)
Admission: RE | Admit: 2018-12-19 | Discharge: 2018-12-19 | Disposition: A | Discharge status: Home / Self Care | Payer: Medicare Other | Source: Ambulatory Visit | Attending: Oncology | Admitting: Oncology

## 2018-12-19 DIAGNOSIS — C155 Malignant neoplasm of lower third of esophagus: Secondary | ICD-10-CM | POA: Diagnosis not present

## 2018-12-19 LAB — GLUCOSE, CAPILLARY: Glucose-Capillary: 107 mg/dL — ABNORMAL HIGH (ref 70–99)

## 2018-12-19 MED ORDER — FLUDEOXYGLUCOSE F - 18 (FDG) INJECTION
9.1000 | Freq: Once | INTRAVENOUS | Status: AC | PRN
  Administered 2018-12-19: 9.1 via INTRAVENOUS

## 2018-12-20 ENCOUNTER — Encounter: Payer: Self-pay | Admitting: Nurse Practitioner

## 2018-12-20 ENCOUNTER — Telehealth: Payer: Self-pay | Admitting: Nurse Practitioner

## 2018-12-20 ENCOUNTER — Inpatient Hospital Stay: Payer: Medicare Other

## 2018-12-20 ENCOUNTER — Other Ambulatory Visit: Payer: Self-pay

## 2018-12-20 ENCOUNTER — Inpatient Hospital Stay (HOSPITAL_BASED_OUTPATIENT_CLINIC_OR_DEPARTMENT_OTHER): Payer: Medicare Other | Admitting: Nurse Practitioner

## 2018-12-20 VITALS — BP 149/82 | HR 91 | Temp 97.8°F | Resp 17 | Ht 71.0 in | Wt 176.9 lb

## 2018-12-20 DIAGNOSIS — E1122 Type 2 diabetes mellitus with diabetic chronic kidney disease: Secondary | ICD-10-CM | POA: Diagnosis not present

## 2018-12-20 DIAGNOSIS — J90 Pleural effusion, not elsewhere classified: Secondary | ICD-10-CM | POA: Diagnosis not present

## 2018-12-20 DIAGNOSIS — R634 Abnormal weight loss: Secondary | ICD-10-CM

## 2018-12-20 DIAGNOSIS — Z931 Gastrostomy status: Secondary | ICD-10-CM

## 2018-12-20 DIAGNOSIS — C155 Malignant neoplasm of lower third of esophagus: Secondary | ICD-10-CM | POA: Diagnosis not present

## 2018-12-20 DIAGNOSIS — M109 Gout, unspecified: Secondary | ICD-10-CM

## 2018-12-20 DIAGNOSIS — Z87891 Personal history of nicotine dependence: Secondary | ICD-10-CM

## 2018-12-20 DIAGNOSIS — N189 Chronic kidney disease, unspecified: Secondary | ICD-10-CM

## 2018-12-20 DIAGNOSIS — R911 Solitary pulmonary nodule: Secondary | ICD-10-CM | POA: Diagnosis not present

## 2018-12-20 DIAGNOSIS — I129 Hypertensive chronic kidney disease with stage 1 through stage 4 chronic kidney disease, or unspecified chronic kidney disease: Secondary | ICD-10-CM

## 2018-12-20 LAB — CBC WITH DIFFERENTIAL (CANCER CENTER ONLY)
Abs Immature Granulocytes: 0.02 10*3/uL (ref 0.00–0.07)
BASOS PCT: 0 %
Basophils Absolute: 0 10*3/uL (ref 0.0–0.1)
Eosinophils Absolute: 0.2 10*3/uL (ref 0.0–0.5)
Eosinophils Relative: 2 %
HCT: 33.4 % — ABNORMAL LOW (ref 39.0–52.0)
Hemoglobin: 10.5 g/dL — ABNORMAL LOW (ref 13.0–17.0)
Immature Granulocytes: 0 %
Lymphocytes Relative: 10 %
Lymphs Abs: 0.8 10*3/uL (ref 0.7–4.0)
MCH: 29.5 pg (ref 26.0–34.0)
MCHC: 31.4 g/dL (ref 30.0–36.0)
MCV: 93.8 fL (ref 80.0–100.0)
Monocytes Absolute: 0.8 10*3/uL (ref 0.1–1.0)
Monocytes Relative: 11 %
Neutro Abs: 6.1 10*3/uL (ref 1.7–7.7)
Neutrophils Relative %: 77 %
PLATELETS: 320 10*3/uL (ref 150–400)
RBC: 3.56 MIL/uL — ABNORMAL LOW (ref 4.22–5.81)
RDW: 18 % — ABNORMAL HIGH (ref 11.5–15.5)
WBC Count: 8 10*3/uL (ref 4.0–10.5)
nRBC: 0 % (ref 0.0–0.2)

## 2018-12-20 LAB — SAMPLE TO BLOOD BANK

## 2018-12-20 NOTE — H&P (View-Only) (Signed)
[place] OFFICE PROGRESS NOTE   Diagnosis: Esophagus cancer  INTERVAL HISTORY:   Lance Herring returns as scheduled.  He continues tube feedings.  The feeding schedules have been disrupted recently due to the test he has been undergoing.  He tolerates minimal liquid by mouth.  No solids.  He denies odynophagia.  He reports recent leg swelling.  No shortness of breath, cough or chest pain.  Objective:  Vital signs in last 24 hours:  Blood pressure (!) 149/82, pulse 91, temperature 97.8 F (36.6 C), temperature source Oral, resp. rate 17, height 5\' 11"  (1.803 m), weight 176 lb 14.4 oz (80.2 kg), SpO2 100 %.    HEENT: Mild white coating over tongue.  No buccal thrush. Resp: Inspiratory rales at both lung bases.  No respiratory distress. Cardio: Regular rate and rhythm. GI: Abdomen soft and nontender.  No hepatomegaly.  Feeding tube site without evidence of infection. Vascular: Pitting edema at the lower legs/ankles bilaterally.   Lab Results:  Lab Results  Component Value Date   WBC 8.0 12/20/2018   HGB 10.5 (L) 12/20/2018   HCT 33.4 (L) 12/20/2018   MCV 93.8 12/20/2018   PLT 320 12/20/2018   NEUTROABS 6.1 12/20/2018    Imaging:  Nm Pet Image Initial (pi) Skull Base To Thigh  Result Date: 12/19/2018 CLINICAL DATA:  Initial treatment strategy for esophageal carcinoma. EXAM: NUCLEAR MEDICINE PET SKULL BASE TO THIGH TECHNIQUE: 9.1 mCi F-18 FDG was injected intravenously. Full-ring PET imaging was performed from the skull base to thigh after the radiotracer. CT data was obtained and used for attenuation correction and anatomic localization. Fasting blood glucose: 107 mg/dl COMPARISON:  None. FINDINGS: Mediastinal blood pool activity: SUV max 2.82 NECK: No hypermetabolic lymph nodes in the neck. Incidental CT findings: none CHEST: No hypermetabolic axillary or supraclavicular lymph nodes. No hypermetabolic mediastinal lymph nodes. Small area of moderate increased  uptake at or just below the GE junction has an SUV max of 8.54. Within the left apex there is a pulmonary nodule measuring 1 cm within SUV max of 4.29. Within the subpleural left upper lobe there is a 4 mm lung nodule, image 31/8. This is too small to characterize by PET-CT. Within the left mid lung there is a perifissural nodule measuring 6 mm, too small to characterize. There are several calcified granulomas noted within the right upper lobe and right middle lobe. Incidental CT findings: Bilateral pleural effusions with overlying atelectasis identified. Aortic atherosclerosis. Calcifications within the LAD and left circumflex coronary arteries. ABDOMEN/PELVIS: No abnormal radiotracer activity within the liver, spleen, pancreas. Small focus of mild to moderate uptake within the left adrenal gland has an SUV max of 4.52. Indeterminate. Incidental CT findings: Gastrostomy tube in place. Calcified liver granulomas. Extensive aortic atherosclerosis. Nodular appearing prostate gland with mass effect upon the bladder base. Left-sided hydrocele identified. Extensive colonic diverticulosis. SKELETON: No focal hypermetabolic activity to suggest skeletal metastasis. Incidental CT findings: none IMPRESSION: 1. Focal area of increased uptake at or just below the level of the GE junction is identified compatible with primary esophageal neoplasm. 2. Moderate FDG uptake associated with left apical lung nodule identified. This is concerning for either metastatic disease from esophageal cancer or synchronous primary neoplasm of the lung. 3. Aortic atherosclerosis and multi vessel coronary artery calcifications. Aortic Atherosclerosis (ICD10-I70.0). 4. Bilateral pleural effusions and lower lobe atelectasis. 5. Prior granulomatous disease. Electronically Signed   By: Kerby Moors M.D.   On: 12/19/2018 09:05    Medications: I  have reviewed the patient's current medications.  Assessment/Plan: 1. Adenocarcinoma of the distal  esophagus/GE junction, clinical T2 versus T3 N0 ? Partially obstructing esophagus mass 02/01/2017, biopsy confirmed moderately differentiated adenocarcinoma ? Staging CTs 03/06/2017-esophagus mass, single noncalcified pulmonary nodule the medial aspect of the left lower lobe, evidence of prior granulomatous disease, right adrenal adenoma ? PET scan 04/07/2017-hypermetabolic distal esophageal mass, no evidence of metastatic disease in the abdomen, pelvis, or skeleton.  The right adrenal mass on CT was not hypermetabolic and favored to be an adenoma ? Radiation and concurrent weekly Taxol/carboplatin 04/20/2017- 05/28/2017 ? CTs 07/05/2018-improvement in esophagus mass ? Endoscopy 07/26/2017-ulceration and friability from the GE junction up to 35 cm, 2 cm mass at the GE junction-improved in size, biopsy revealed no malignancy ? PET scan 08/65/7846- hypermetabolic activity at the distal esophagus and GE junction, no evidence of distant metastatic disease ? Admission to [facility] 11/18/2018 with a syncope event secondary to upper GI bleeding ? Upper endoscopy 11/18/2018- obstructing mass at 40 cm, biopsies revealed inflammation and no malignancy ? CTs 11/20/2018- 10 mm nodule at the left lung apex, thickening at the distal esophagus, evidence of prior granulomatous disease with calcified right lung nodules and right paratracheal/hilar lymph nodes ? Upper endoscopy 12/07/2026- pinpoint stricture in the distal esophagus, lumen was 1 mm.  Gastroesophageal mucosa with exudate consistent with ulcer.  No malignancy.   ? PET scan 12/19/2018- focal area of increased uptake at or just below the level of the GE junction; moderate FDG uptake associated with left apical lung nodule.  Bilateral pleural effusions and lower lobe atelectasis. 2. Upper GI bleeding secondary to #1, 11/18/2018-transfused with a total of 4 units of packed red blood cells 3. Nutrition- placement of gastrostomy feeding tube 11/22/2018  Feeding  tube placed initial treatment in 2018, removed May 2019  4.   Diabetes 5.   Hypertension 6.   Gout 7.   Chronic renal failure  Disposition: Lance Herring appears stable.  The recent upper endoscopy showed a stricture in the distal esophagus, lumen approximately 1 mm.  Biopsy negative for malignancy.  PET scan from yesterday showed increased uptake at the level of the GE junction and involving a left apical lung nodule.  He was also noted to have bilateral pleural effusions.  Dr. Benay Spice reviewed the PET scan report/images with Lance Herring and his wife at today's visit.  His case will be presented at the GI tumor conference 12/21/2018.  He has lost weight since his last visit.  He will continue the tube feedings.  We will arrange for an appointment with the [facility] dietitian when he returns in 2 weeks.  He will return for a follow-up visit on 01/03/2019.  He will contact the office in the interim with any problems.  Patient seen with Dr. Benay Spice.  25 minutes were spent face-to-face at today's visit with the majority of that time involved in counseling/coordination of care.    Ned Card ANP/GNP-BC   12/20/2018  10:01 AM  This was a shared visit with Ned Card.  We discussed the endoscopy findings and reviewed the PET images with Lance Herring and his wife.  He has an esophageal stricture, but no tumor has been found on 2 biopsies.  The PET suggest hypermetabolic tumor at the GE junction and a hypermetabolic lung nodule.  I will present his case at the GI tumor conference to discuss options for a biopsy of the hypermetabolic lung nodule.  This nodule could represent metastatic esophagus cancer  versus a new primary.  I will asked Dr. Servando Snare to review the PET images.  Julieanne Manson, MD

## 2018-12-20 NOTE — Telephone Encounter (Signed)
Gave avs and calendar ° °

## 2018-12-20 NOTE — Progress Notes (Addendum)
[place] OFFICE PROGRESS NOTE   Diagnosis: Esophagus cancer  INTERVAL HISTORY:   Mr. Quintela returns as scheduled.  He continues tube feedings.  The feeding schedules have been disrupted recently due to the test he has been undergoing.  He tolerates minimal liquid by mouth.  No solids.  He denies odynophagia.  He reports recent leg swelling.  No shortness of breath, cough or chest pain.  Objective:  Vital signs in last 24 hours:  Blood pressure (!) 149/82, pulse 91, temperature 97.8 F (36.6 C), temperature source Oral, resp. rate 17, height 5\' 11"  (1.803 m), weight 176 lb 14.4 oz (80.2 kg), SpO2 100 %.    HEENT: Mild white coating over tongue.  No buccal thrush. Resp: Inspiratory rales at both lung bases.  No respiratory distress. Cardio: Regular rate and rhythm. GI: Abdomen soft and nontender.  No hepatomegaly.  Feeding tube site without evidence of infection. Vascular: Pitting edema at the lower legs/ankles bilaterally.   Lab Results:  Lab Results  Component Value Date   WBC 8.0 12/20/2018   HGB 10.5 (L) 12/20/2018   HCT 33.4 (L) 12/20/2018   MCV 93.8 12/20/2018   PLT 320 12/20/2018   NEUTROABS 6.1 12/20/2018    Imaging:  Nm Pet Image Initial (pi) Skull Base To Thigh  Result Date: 12/19/2018 CLINICAL DATA:  Initial treatment strategy for esophageal carcinoma. EXAM: NUCLEAR MEDICINE PET SKULL BASE TO THIGH TECHNIQUE: 9.1 mCi F-18 FDG was injected intravenously. Full-ring PET imaging was performed from the skull base to thigh after the radiotracer. CT data was obtained and used for attenuation correction and anatomic localization. Fasting blood glucose: 107 mg/dl COMPARISON:  None. FINDINGS: Mediastinal blood pool activity: SUV max 2.82 NECK: No hypermetabolic lymph nodes in the neck. Incidental CT findings: none CHEST: No hypermetabolic axillary or supraclavicular lymph nodes. No hypermetabolic mediastinal lymph nodes. Small area of moderate increased  uptake at or just below the GE junction has an SUV max of 8.54. Within the left apex there is a pulmonary nodule measuring 1 cm within SUV max of 4.29. Within the subpleural left upper lobe there is a 4 mm lung nodule, image 31/8. This is too small to characterize by PET-CT. Within the left mid lung there is a perifissural nodule measuring 6 mm, too small to characterize. There are several calcified granulomas noted within the right upper lobe and right middle lobe. Incidental CT findings: Bilateral pleural effusions with overlying atelectasis identified. Aortic atherosclerosis. Calcifications within the LAD and left circumflex coronary arteries. ABDOMEN/PELVIS: No abnormal radiotracer activity within the liver, spleen, pancreas. Small focus of mild to moderate uptake within the left adrenal gland has an SUV max of 4.52. Indeterminate. Incidental CT findings: Gastrostomy tube in place. Calcified liver granulomas. Extensive aortic atherosclerosis. Nodular appearing prostate gland with mass effect upon the bladder base. Left-sided hydrocele identified. Extensive colonic diverticulosis. SKELETON: No focal hypermetabolic activity to suggest skeletal metastasis. Incidental CT findings: none IMPRESSION: 1. Focal area of increased uptake at or just below the level of the GE junction is identified compatible with primary esophageal neoplasm. 2. Moderate FDG uptake associated with left apical lung nodule identified. This is concerning for either metastatic disease from esophageal cancer or synchronous primary neoplasm of the lung. 3. Aortic atherosclerosis and multi vessel coronary artery calcifications. Aortic Atherosclerosis (ICD10-I70.0). 4. Bilateral pleural effusions and lower lobe atelectasis. 5. Prior granulomatous disease. Electronically Signed   By: Kerby Moors M.D.   On: 12/19/2018 09:05    Medications: I  have reviewed the patient's current medications.  Assessment/Plan: 1. Adenocarcinoma of the distal  esophagus/GE junction, clinical T2 versus T3 N0 ? Partially obstructing esophagus mass 02/01/2017, biopsy confirmed moderately differentiated adenocarcinoma ? Staging CTs 03/06/2017-esophagus mass, single noncalcified pulmonary nodule the medial aspect of the left lower lobe, evidence of prior granulomatous disease, right adrenal adenoma ? PET scan 04/07/2017-hypermetabolic distal esophageal mass, no evidence of metastatic disease in the abdomen, pelvis, or skeleton.  The right adrenal mass on CT was not hypermetabolic and favored to be an adenoma ? Radiation and concurrent weekly Taxol/carboplatin 04/20/2017- 05/28/2017 ? CTs 07/05/2018-improvement in esophagus mass ? Endoscopy 07/26/2017-ulceration and friability from the GE junction up to 35 cm, 2 cm mass at the GE junction-improved in size, biopsy revealed no malignancy ? PET scan 24/40/1027- hypermetabolic activity at the distal esophagus and GE junction, no evidence of distant metastatic disease ? Admission to [facility] 11/18/2018 with a syncope event secondary to upper GI bleeding ? Upper endoscopy 11/18/2018- obstructing mass at 40 cm, biopsies revealed inflammation and no malignancy ? CTs 11/20/2018- 10 mm nodule at the left lung apex, thickening at the distal esophagus, evidence of prior granulomatous disease with calcified right lung nodules and right paratracheal/hilar lymph nodes ? Upper endoscopy 12/07/2026- pinpoint stricture in the distal esophagus, lumen was 1 mm.  Gastroesophageal mucosa with exudate consistent with ulcer.  No malignancy.   ? PET scan 12/19/2018- focal area of increased uptake at or just below the level of the GE junction; moderate FDG uptake associated with left apical lung nodule.  Bilateral pleural effusions and lower lobe atelectasis. 2. Upper GI bleeding secondary to #1, 11/18/2018-transfused with a total of 4 units of packed red blood cells 3. Nutrition- placement of gastrostomy feeding tube 11/22/2018  Feeding  tube placed initial treatment in 2018, removed May 2019  4.   Diabetes 5.   Hypertension 6.   Gout 7.   Chronic renal failure  Disposition: Mr. Delpizzo appears stable.  The recent upper endoscopy showed a stricture in the distal esophagus, lumen approximately 1 mm.  Biopsy negative for malignancy.  PET scan from yesterday showed increased uptake at the level of the GE junction and involving a left apical lung nodule.  He was also noted to have bilateral pleural effusions.  Dr. Benay Spice reviewed the PET scan report/images with Mr. Degrace and his wife at today's visit.  His case will be presented at the GI tumor conference 12/21/2018.  He has lost weight since his last visit.  He will continue the tube feedings.  We will arrange for an appointment with the [facility] dietitian when he returns in 2 weeks.  He will return for a follow-up visit on 01/03/2019.  He will contact the office in the interim with any problems.  Patient seen with Dr. Benay Spice.  25 minutes were spent face-to-face at today's visit with the majority of that time involved in counseling/coordination of care.    Ned Card ANP/GNP-BC   12/20/2018  10:01 AM  This was a shared visit with Ned Card.  We discussed the endoscopy findings and reviewed the PET images with Mr. Wakefield and his wife.  He has an esophageal stricture, but no tumor has been found on 2 biopsies.  The PET suggest hypermetabolic tumor at the GE junction and a hypermetabolic lung nodule.  I will present his case at the GI tumor conference to discuss options for a biopsy of the hypermetabolic lung nodule.  This nodule could represent metastatic esophagus cancer  versus a new primary.  I will asked Dr. Servando Snare to review the PET images.  Julieanne Manson, MD

## 2018-12-21 ENCOUNTER — Telehealth: Payer: Self-pay | Admitting: Gastroenterology

## 2018-12-21 NOTE — Telephone Encounter (Signed)
Patty, Can you please let him know that we discussed his case today at GI cancer conference.  We decided to attempt biopsy of the proximal gastric PET avid mass.  It will take some logistics however.  I would like to do an EGD for him at [facility] on March 12, Thursday.  We will need to coordinate this with interventional radiology at [facility] that same day.  They will need to be present the same day and endoscopy.  Likely they will pull his G-tube to allow some sort of access to his stomach via the G-tube tract.  May need a sheath placed, will need new G-tube placed after the procedure.  Should probably be booked as a 1 hour case, call it an EGD, definitely anesthesia support with MAC sedation.  Interventional radiology present if IR has any questions please send this to the attention of Dr. Kathlene Cote who knows about the case.  Thank you

## 2018-12-21 NOTE — Telephone Encounter (Signed)
Left message on machine to call back  

## 2018-12-22 ENCOUNTER — Other Ambulatory Visit (HOSPITAL_COMMUNITY): Payer: Self-pay | Admitting: Gastroenterology

## 2018-12-22 ENCOUNTER — Other Ambulatory Visit: Payer: Self-pay

## 2018-12-22 DIAGNOSIS — K222 Esophageal obstruction: Secondary | ICD-10-CM

## 2018-12-22 DIAGNOSIS — K3189 Other diseases of stomach and duodenum: Secondary | ICD-10-CM

## 2018-12-22 DIAGNOSIS — R4702 Dysphasia: Secondary | ICD-10-CM

## 2018-12-22 NOTE — Telephone Encounter (Signed)
Tiffany confirmed that a Doctor from IR will be at the case on 3/12 at 1230 pm for 1 hour.    The patient has been notified of this information and all questions answered.

## 2018-12-22 NOTE — Telephone Encounter (Signed)
Dr Ardis Hughs scheduling says that you don't have enough time to do any cases on March 12.  Your last one goes to 1215 pm.  Please advise

## 2018-12-22 NOTE — Telephone Encounter (Signed)
I just spoke with Larene Beach RN at [facility] and she was able to adjust the schedule for that day March 12 to allow for the case. Can you try to rebook it again.  thank

## 2018-12-22 NOTE — Telephone Encounter (Signed)
EGD [facility] 01/05/19 at 1215 pm.  I spoke with Tiffany at IR [phone] about coordinating IR at same time as EGD to pull G-tube, place possible sheath and replace G tube with a new one after procedure.  She will speak with Dr Kathlene Cote and call back to confirm

## 2018-12-23 ENCOUNTER — Telehealth: Payer: Self-pay | Admitting: Oncology

## 2018-12-23 NOTE — Telephone Encounter (Signed)
R/s appt per 2/28 sch message - pt is aware of appt date and time

## 2018-12-23 NOTE — Telephone Encounter (Signed)
EGD scheduled, pt instructed and medications reviewed.  Patient instructions mailed to home.  Patient to call with any questions or concerns.  

## 2019-01-03 ENCOUNTER — Ambulatory Visit: Payer: Medicare Other | Admitting: Nurse Practitioner

## 2019-01-03 ENCOUNTER — Encounter: Payer: Medicare Other | Admitting: Nutrition

## 2019-01-05 ENCOUNTER — Encounter (HOSPITAL_COMMUNITY): Payer: Self-pay | Admitting: Gastroenterology

## 2019-01-05 ENCOUNTER — Other Ambulatory Visit: Payer: Self-pay

## 2019-01-05 ENCOUNTER — Ambulatory Visit (HOSPITAL_COMMUNITY): Payer: Medicare Other | Admitting: Anesthesiology

## 2019-01-05 ENCOUNTER — Encounter (HOSPITAL_COMMUNITY)
Admission: RE | Disposition: A | Discharge status: Home / Self Care | Payer: Self-pay | Source: Home / Self Care | Attending: Gastroenterology

## 2019-01-05 ENCOUNTER — Inpatient Hospital Stay (HOSPITAL_COMMUNITY): Admission: RE | Admit: 2019-01-05 | Payer: Medicare Other | Source: Ambulatory Visit

## 2019-01-05 ENCOUNTER — Ambulatory Visit (HOSPITAL_COMMUNITY)
Admission: RE | Admit: 2019-01-05 | Discharge: 2019-01-05 | Disposition: A | Discharge status: Home / Self Care | Payer: Medicare Other | Attending: Gastroenterology | Admitting: Gastroenterology

## 2019-01-05 DIAGNOSIS — C169 Malignant neoplasm of stomach, unspecified: Secondary | ICD-10-CM

## 2019-01-05 DIAGNOSIS — C160 Malignant neoplasm of cardia: Secondary | ICD-10-CM | POA: Insufficient documentation

## 2019-01-05 DIAGNOSIS — E1122 Type 2 diabetes mellitus with diabetic chronic kidney disease: Secondary | ICD-10-CM | POA: Diagnosis not present

## 2019-01-05 DIAGNOSIS — K3189 Other diseases of stomach and duodenum: Secondary | ICD-10-CM

## 2019-01-05 DIAGNOSIS — Z87891 Personal history of nicotine dependence: Secondary | ICD-10-CM | POA: Insufficient documentation

## 2019-01-05 DIAGNOSIS — N189 Chronic kidney disease, unspecified: Secondary | ICD-10-CM | POA: Diagnosis not present

## 2019-01-05 DIAGNOSIS — M109 Gout, unspecified: Secondary | ICD-10-CM | POA: Insufficient documentation

## 2019-01-05 DIAGNOSIS — I129 Hypertensive chronic kidney disease with stage 1 through stage 4 chronic kidney disease, or unspecified chronic kidney disease: Secondary | ICD-10-CM | POA: Insufficient documentation

## 2019-01-05 DIAGNOSIS — Z931 Gastrostomy status: Secondary | ICD-10-CM | POA: Insufficient documentation

## 2019-01-05 DIAGNOSIS — Z923 Personal history of irradiation: Secondary | ICD-10-CM | POA: Insufficient documentation

## 2019-01-05 DIAGNOSIS — R1900 Intra-abdominal and pelvic swelling, mass and lump, unspecified site: Secondary | ICD-10-CM | POA: Diagnosis present

## 2019-01-05 DIAGNOSIS — Z9221 Personal history of antineoplastic chemotherapy: Secondary | ICD-10-CM | POA: Insufficient documentation

## 2019-01-05 DIAGNOSIS — K222 Esophageal obstruction: Secondary | ICD-10-CM

## 2019-01-05 HISTORY — PX: BIOPSY: SHX5522

## 2019-01-05 HISTORY — PX: ESOPHAGOGASTRODUODENOSCOPY (EGD) WITH PROPOFOL: SHX5813

## 2019-01-05 LAB — GLUCOSE, CAPILLARY: Glucose-Capillary: 114 mg/dL — ABNORMAL HIGH (ref 70–99)

## 2019-01-05 SURGERY — ESOPHAGOGASTRODUODENOSCOPY (EGD) WITH PROPOFOL
Anesthesia: Monitor Anesthesia Care

## 2019-01-05 MED ORDER — SODIUM CHLORIDE 0.9 % IV SOLN: INTRAVENOUS | Status: DC

## 2019-01-05 MED ORDER — PROPOFOL 500 MG/50ML IV EMUL
INTRAVENOUS | Status: DC | PRN
  Administered 2019-01-05: 200 ug/kg/min via INTRAVENOUS

## 2019-01-05 MED ORDER — LACTATED RINGERS IV SOLN
INTRAVENOUS | Status: DC
  Administered 2019-01-05: 1000 mL via INTRAVENOUS

## 2019-01-05 MED ORDER — PROPOFOL 10 MG/ML IV BOLUS
INTRAVENOUS | Status: AC
  Filled 2019-01-05: qty 40

## 2019-01-05 MED ORDER — PROPOFOL 10 MG/ML IV BOLUS
INTRAVENOUS | Status: AC
  Filled 2019-01-05: qty 20

## 2019-01-05 MED ORDER — PROPOFOL 10 MG/ML IV BOLUS: INTRAVENOUS | Status: DC | PRN

## 2019-01-05 SURGICAL SUPPLY — 14 items

## 2019-01-05 NOTE — Anesthesia Preprocedure Evaluation (Signed)
Anesthesia Evaluation  Patient identified by MRN, date of birth, ID band Patient awake    Reviewed: Allergy & Precautions, NPO status , Patient's Chart, lab work & pertinent test results  Airway Mallampati: II  TM Distance: >3 FB Neck ROM: Full    Dental no notable dental hx.    Pulmonary neg pulmonary ROS, former smoker,    Pulmonary exam normal breath sounds clear to auscultation       Cardiovascular hypertension, + Valvular Problems/Murmurs AI  Rhythm:Regular Rate:Normal + Systolic murmurs EF nl Mild AI   Neuro/Psych negative neurological ROS  negative psych ROS   GI/Hepatic Neg liver ROS, Esophageal cancer   Endo/Other  diabetes  Renal/GU Renal InsufficiencyRenal disease  negative genitourinary   Musculoskeletal negative musculoskeletal ROS (+)   Abdominal   Peds negative pediatric ROS (+)  Hematology negative hematology ROS (+)   Anesthesia Other Findings   Reproductive/Obstetrics negative OB ROS                             Anesthesia Physical Anesthesia Plan  ASA: III  Anesthesia Plan: MAC   Post-op Pain Management:    Induction: Intravenous  PONV Risk Score and Plan:   Airway Management Planned: Simple Face Mask  Additional Equipment:   Intra-op Plan:   Post-operative Plan:   Informed Consent: I have reviewed the patients History and Physical, chart, labs and discussed the procedure including the risks, benefits and alternatives for the proposed anesthesia with the patient or authorized representative who has indicated his/her understanding and acceptance.     Dental advisory given  Plan Discussed with: CRNA and Surgeon  Anesthesia Plan Comments:         Anesthesia Quick Evaluation

## 2019-01-05 NOTE — Interval H&P Note (Signed)
History and Physical Interval Note:  01/05/2019 11:11 AM  Lance Herring  has presented today for surgery, with the diagnosis of PET Avid gastric Mass with interventional radiology.  The various methods of treatment have been discussed with the patient and family. After consideration of risks, benefits and other options for treatment, the patient has consented to  Procedure(s): ESOPHAGOGASTRODUODENOSCOPY (EGD) WITH PROPOFOL (N/A) as a surgical intervention.  The patient's history has been reviewed, patient examined, no change in status, stable for surgery.  I have reviewed the patient's chart and labs.  Questions were answered to the patient's satisfaction.     Lance Herring

## 2019-01-05 NOTE — Transfer of Care (Signed)
Immediate Anesthesia Transfer of Care Note  Patient: Lance Herring  Procedure(s) Performed: ESOPHAGOGASTRODUODENOSCOPY (EGD) WITH PROPOFOL (N/A ) BIOPSY  Patient Location: PACU and Endoscopy Unit  Anesthesia Type:MAC  Level of Consciousness: awake, alert  and oriented  Airway & Oxygen Therapy: Patient Spontanous Breathing and Patient connected to nasal cannula oxygen  Post-op Assessment: Report given to RN and Post -op Vital signs reviewed and stable  Post vital signs: Reviewed and stable  Last Vitals:  Vitals Value Taken Time  BP 163/81 01/05/2019  2:20 PM  Temp    Pulse 78 01/05/2019  2:22 PM  Resp 18 01/05/2019  2:22 PM  SpO2 100 % 01/05/2019  2:22 PM  Vitals shown include unvalidated device data.  Last Pain:  Vitals:   01/05/19 1117  TempSrc: Oral  PainSc: 0-No pain         Complications: No apparent anesthesia complications

## 2019-01-05 NOTE — Op Note (Signed)
[facility] Patient Name: Lance Herring Procedure Date: 01/05/2019 MRN: [id number] Attending MD: Milus Banister , MD Date of Birth: 12-Feb-1946 CSN: [id number] Age: 73 Admit Type: Outpatient Procedure:                Upper GI endoscopy Indications:              GE junction adenocarcinoma s/p chemo/XRT                            (elsewhere), complicated by severe GE junction                            stricture, recent PET avid proximal stomach region Providers:                Milus Banister, MD, Cleda Daub, RN, Elspeth Cho Tech., Technician, Karis Juba, CRNA Referring MD:             Julieanne Manson, MD Medicines:                Monitored Anesthesia Care Complications:            No immediate complications. Estimated blood loss:                            None. Estimated Blood Loss:     Estimated blood loss: none. Procedure:                Pre-Anesthesia Assessment:                           - Prior to the procedure, a History and Physical                            was performed, and patient medications and                            allergies were reviewed. The patient's tolerance of                            previous anesthesia was also reviewed. The risks                            and benefits of the procedure and the sedation                            options and risks were discussed with the patient.                            All questions were answered, and informed consent                            was obtained. Prior Anticoagulants: The patient has  taken no previous anticoagulant or antiplatelet                            agents. ASA Grade Assessment: II - A patient with                            mild systemic disease. After reviewing the risks                            and benefits, the patient was deemed in                            satisfactory condition to undergo the procedure.                        After obtaining informed consent, the endoscope was                            passed under direct vision. Throughout the                            procedure, the patient's blood pressure, pulse, and                            oxygen saturations were monitored continuously. The                            [id number]-[id number] ([id number] Olympus ultra slim endoscope                            was introduced through the gastrostomy, and                            advanced to the pylorus. The upper GI endoscopy was                            accomplished without difficulty. The patient                            tolerated the procedure well. Scope In: Scope Out: Findings:      The balloon of the IR placed G-tube was deflated and the G-tube was       removed. The G-tube tract was then intubated easily using a 5.24mm       diameter pediatric gatroscope (ultraslim). A 2.5WL umbilicated, friable,       ulcerated mass was found in the cardia of the stomach, adjacent to the       pin hole sized GE junction lumen. The ulcerated mass was sampled       extensively with forceps. Following the procedure the same G-tube was       replaced into the stomach through the tract and the internal bumper       balloon reinflated (by Dr. Laurence Ferrari from IR). Impression:               The balloon of the IR placed G-tube was deflated  and the G-tube was removed. The G-tube tract was                            then intubated easily using a 5.73mm diameter                            pediatric gatroscope (ultraslim). A 1.5cm                            umbilicated, friable, ulcerated mass was found in                            the cardia of the stomach, adjacent to the pin hole                            sized GE junction lumen. The ulcerated mass was                            sampled extensively with forceps. Following the                            procedure the same G-tube was  replaced into the                            stomach through the tract and the internal bumper                            balloon reinflated (by Dr. Laurence Ferrari from IR). Moderate Sedation:      Not Applicable - Patient had care per Anesthesia. Recommendation:           - Patient has a contact number available for                            emergencies. The signs and symptoms of potential                            delayed complications were discussed with the                            patient. Return to normal activities tomorrow.                            Written discharge instructions were provided to the                            patient.                           - Resume previous diet.                           - Continue present medications.                           -  Await pathology results. Procedure Code(s):        --- Professional ---                           [phone], Unlisted procedure, stomach Diagnosis Code(s):        --- Professional ---                           K31.89, Other diseases of stomach and duodenum                           R19.5, Other fecal abnormalities CPT copyright 2018 American [employer] Association. All rights reserved. The codes documented in this report are preliminary and upon coder review may  be revised to meet current compliance requirements. Milus Banister, MD 01/05/2019 2:23:09 PM This report has been signed electronically. Number of Addenda: 0

## 2019-01-05 NOTE — Discharge Instructions (Signed)
Upper Endoscopy, Adult Upper endoscopy is a procedure to look inside the upper GI (gastrointestinal) tract. The upper GI tract is made up of:  The part of the body that moves food from your mouth to your stomach (esophagus).  The stomach.  The first part of your small intestine (duodenum). This procedure is also called esophagogastroduodenoscopy (EGD) or gastroscopy. In this procedure, your health care provider passes a thin, flexible tube (endoscope) through your mouth and down your esophagus into your stomach. A small camera is attached to the end of the tube. Images from the camera appear on a monitor in the exam room. During this procedure, your health care provider may also remove a small piece of tissue to be sent to a lab and examined under a microscope (biopsy). Your health care provider may do an upper endoscopy to diagnose cancers of the upper GI tract. You may also have this procedure to find the cause of other conditions, such as:  Stomach pain.  Heartburn.  Pain or problems when swallowing.  Nausea and vomiting.  Stomach bleeding.  Stomach ulcers. Tell a health care provider about:  Any allergies you have.  All medicines you are taking, including vitamins, herbs, eye drops, creams, and over-the-counter medicines.  Any problems you or family members have had with anesthetic medicines.  Any blood disorders you have.  Any surgeries you have had.  Any medical conditions you have.  Whether you are pregnant or may be pregnant. What are the risks? Generally, this is a safe procedure. However, problems may occur, including:  Infection.  Bleeding.  Allergic reactions to medicines.  A tear or hole (perforation) in the esophagus, stomach, or duodenum. What happens before the procedure? Staying hydrated Follow instructions from your health care provider about hydration, which may include:  Up to 2 hours before the procedure - you may continue to drink clear  liquids, such as water, clear fruit juice, black coffee, and plain tea.  Eating and drinking restrictions Follow instructions from your health care provider about eating and drinking, which may include:  8 hours before the procedure - stop eating heavy meals or foods, such as meat, fried foods, or fatty foods.  6 hours before the procedure - stop eating light meals or foods, such as toast or cereal.  6 hours before the procedure - stop drinking milk or drinks that contain milk.  2 hours before the procedure - stop drinking clear liquids. Medicines Ask your health care provider about:  Changing or stopping your regular medicines. This is especially important if you are taking diabetes medicines or blood thinners.  Taking medicines such as aspirin and ibuprofen. These medicines can thin your blood. Do not take these medicines unless your health care provider tells you to take them.  Taking over-the-counter medicines, vitamins, herbs, and supplements. General instructions  Plan to have someone take you home from the hospital or clinic.  If you will be going home right after the procedure, plan to have someone with you for 24 hours.  Ask your health care provider what steps will be taken to help prevent infection. What happens during the procedure?   An IV will be inserted into one of your veins.  You may be given one or more of the following: ? A medicine to help you relax (sedative). ? A medicine to numb the throat (local anesthetic).  You will lie on your left side on an exam table.  Your health care provider will pass the endoscope through  your mouth and down your esophagus. °· Your health care provider will use the scope to check the inside of your esophagus, stomach, and duodenum. Biopsies may be taken. °· The endoscope will be removed. °The procedure may vary among health care providers and hospitals. °What happens after the procedure? °· Your blood pressure, heart rate,  breathing rate, and blood oxygen level will be monitored until you leave the hospital or clinic. °· Do not drive for 24 hours if you were given a sedative during your procedure. °· When your throat is no longer numb, you may be given some fluids to drink. °· It is up to you to get the results of your procedure. Ask your health care provider, or the department that is doing the procedure, when your results will be ready. °Summary °· Upper endoscopy is a procedure to look inside the upper GI tract. °· During the procedure, an IV will be inserted into one of your veins. You may be given a medicine to help you relax. °· A medicine will be used to numb your throat. °· The endoscope will be passed through your mouth and down your esophagus. °This information is not intended to replace advice given to you by your health care provider. Make sure you discuss any questions you have with your health care provider. °Document Released: 10/09/2000 Document Revised: 03/14/2018 Document Reviewed: 03/14/2018 °Elsevier Interactive Patient Education © 2019 [employer] ° °

## 2019-01-05 NOTE — Anesthesia Postprocedure Evaluation (Signed)
Anesthesia Post Note  Patient: Lance Herring  Procedure(s) Performed: ESOPHAGOGASTRODUODENOSCOPY (EGD) WITH PROPOFOL (N/A ) BIOPSY     Patient location during evaluation: PACU Anesthesia Type: MAC Level of consciousness: awake and alert Pain management: pain level controlled Vital Signs Assessment: post-procedure vital signs reviewed and stable Respiratory status: spontaneous breathing, nonlabored ventilation, respiratory function stable and patient connected to nasal cannula oxygen Cardiovascular status: stable and blood pressure returned to baseline Postop Assessment: no apparent nausea or vomiting Anesthetic complications: no    Last Vitals:  Vitals:   01/05/19 1431 01/05/19 1440  BP: (!) 163/82 (!) 168/81  Pulse: 79 75  Resp: 13 (!) 9  Temp:    SpO2: 97% 95%    Last Pain:  Vitals:   01/05/19 1440  TempSrc:   PainSc: 0-No pain                 Rhyann Berton S

## 2019-01-06 ENCOUNTER — Encounter (HOSPITAL_COMMUNITY): Payer: Self-pay | Admitting: Gastroenterology

## 2019-01-12 ENCOUNTER — Other Ambulatory Visit: Payer: Self-pay

## 2019-01-12 ENCOUNTER — Other Ambulatory Visit: Payer: Self-pay | Admitting: *Deleted

## 2019-01-12 ENCOUNTER — Telehealth: Payer: Self-pay | Admitting: Oncology

## 2019-01-12 ENCOUNTER — Other Ambulatory Visit: Payer: Medicare Other

## 2019-01-12 ENCOUNTER — Inpatient Hospital Stay: Payer: Medicare Other | Admitting: Nutrition

## 2019-01-12 ENCOUNTER — Inpatient Hospital Stay: Payer: Medicare Other | Attending: Oncology | Admitting: Oncology

## 2019-01-12 VITALS — BP 159/87 | HR 88 | Temp 98.1°F | Resp 19 | Ht 71.0 in | Wt 180.6 lb

## 2019-01-12 DIAGNOSIS — I129 Hypertensive chronic kidney disease with stage 1 through stage 4 chronic kidney disease, or unspecified chronic kidney disease: Secondary | ICD-10-CM | POA: Diagnosis not present

## 2019-01-12 DIAGNOSIS — J90 Pleural effusion, not elsewhere classified: Secondary | ICD-10-CM | POA: Diagnosis not present

## 2019-01-12 DIAGNOSIS — M109 Gout, unspecified: Secondary | ICD-10-CM | POA: Diagnosis not present

## 2019-01-12 DIAGNOSIS — Z452 Encounter for adjustment and management of vascular access device: Secondary | ICD-10-CM | POA: Insufficient documentation

## 2019-01-12 DIAGNOSIS — N189 Chronic kidney disease, unspecified: Secondary | ICD-10-CM | POA: Diagnosis not present

## 2019-01-12 DIAGNOSIS — Z5111 Encounter for antineoplastic chemotherapy: Secondary | ICD-10-CM | POA: Insufficient documentation

## 2019-01-12 DIAGNOSIS — Z931 Gastrostomy status: Secondary | ICD-10-CM | POA: Diagnosis not present

## 2019-01-12 DIAGNOSIS — R911 Solitary pulmonary nodule: Secondary | ICD-10-CM | POA: Insufficient documentation

## 2019-01-12 DIAGNOSIS — C155 Malignant neoplasm of lower third of esophagus: Secondary | ICD-10-CM | POA: Insufficient documentation

## 2019-01-12 DIAGNOSIS — C160 Malignant neoplasm of cardia: Secondary | ICD-10-CM | POA: Insufficient documentation

## 2019-01-12 DIAGNOSIS — Z87891 Personal history of nicotine dependence: Secondary | ICD-10-CM

## 2019-01-12 DIAGNOSIS — E1122 Type 2 diabetes mellitus with diabetic chronic kidney disease: Secondary | ICD-10-CM | POA: Insufficient documentation

## 2019-01-12 MED ORDER — LIDOCAINE-PRILOCAINE 2.5-2.5 % EX CREA: 1.0000 "application " | TOPICAL_CREAM | CUTANEOUS | 0 refills | Status: DC | PRN

## 2019-01-12 MED ORDER — PROMETHAZINE HCL 6.25 MG/5ML PO SYRP: 12.5000 mg | ORAL_SOLUTION | Freq: Four times a day (QID) | ORAL | 1 refills | Status: DC | PRN

## 2019-01-12 NOTE — Progress Notes (Signed)
START ON PATHWAY REGIMEN - Gastroesophageal     A cycle is every 14 days:     Oxaliplatin      Leucovorin      5-Fluorouracil      5-Fluorouracil   **Always confirm dose/schedule in your pharmacy ordering system**  Patient Characteristics: Distant Metastases (cM1/pM1) / Locally Recurrent Disease, Adenocarcinoma - Esophageal, GE Junction, and Gastric, First Line, HER2 Negative / Unknown Histology: Adenocarcinoma Disease Classification: GE Junction Therapeutic Status: Local Recurrence (No Additional Staging) Line of Therapy: First Line HER2 Status: Awaiting Test Results Intent of Therapy: Curative Intent, Discussed with Patient 

## 2019-01-12 NOTE — Progress Notes (Signed)
[place] OFFICE PROGRESS NOTE   Diagnosis: Esophagus cancer  INTERVAL HISTORY:   Lance Herring returns as scheduled.  He reports feeling well.  No new complaint.  He continues gastrostomy tube feedings.  He is taking his medications via the gastrostomy tube.  He underwent an endoscopy via the gastrostomy tube tract on 01/05/2019.  Lance Herring noted a 1.5 cm friable ulcerated mass in the gastric cardia adjacent to the GE junction lumen.  The mass was biopsied.  The pathology ([id number]) revealed adenocarcinoma.     Objective:  Vital signs in last 24 hours:  Blood pressure (!) 149/82, pulse 91, temperature 97.8 F (36.6 C), temperature source Oral, resp. rate 17, height 5\' 11"  (1.803 m), weight 176 lb 14.4 oz (80.2 kg), SpO2 100 %.    HEENT: Neck without mass Resp: Lungs clear bilaterally Cardio: Regular rate and rhythm. GI: Abdomen soft and nontender.  No hepatomegaly.  Gastrostomy tube site without evidence of infection Vascular: Trace pitting edema at the right greater than left lower leg and ankle   Lab Results:  Lab Results  Component Value Date   WBC 8.0 12/20/2018   HGB 10.5 (L) 12/20/2018   HCT 33.4 (L) 12/20/2018   MCV 93.8 12/20/2018   PLT 320 12/20/2018   NEUTROABS 6.1 12/20/2018    Imaging:  Nm Pet Image Initial (pi) Skull Base To Thigh  Result Date: 12/19/2018 CLINICAL DATA:  Initial treatment strategy for esophageal carcinoma. EXAM: NUCLEAR MEDICINE PET SKULL BASE TO THIGH TECHNIQUE: 9.1 mCi F-18 FDG was injected intravenously. Full-ring PET imaging was performed from the skull base to thigh after the radiotracer. CT data was obtained and used for attenuation correction and anatomic localization. Fasting blood glucose: 107 mg/dl COMPARISON:  None. FINDINGS: Mediastinal blood pool activity: SUV max 2.82 NECK: No hypermetabolic lymph nodes in the neck. Incidental CT findings: none CHEST: No hypermetabolic axillary or supraclavicular lymph nodes. No  hypermetabolic mediastinal lymph nodes. Small area of moderate increased uptake at or just below the GE junction has an SUV max of 8.54. Within the left apex there is a pulmonary nodule measuring 1 cm within SUV max of 4.29. Within the subpleural left upper lobe there is a 4 mm lung nodule, image 31/8. This is too small to characterize by PET-CT. Within the left mid lung there is a perifissural nodule measuring 6 mm, too small to characterize. There are several calcified granulomas noted within the right upper lobe and right middle lobe. Incidental CT findings: Bilateral pleural effusions with overlying atelectasis identified. Aortic atherosclerosis. Calcifications within the LAD and left circumflex coronary arteries. ABDOMEN/PELVIS: No abnormal radiotracer activity within the liver, spleen, pancreas. Small focus of mild to moderate uptake within the left adrenal gland has an SUV max of 4.52. Indeterminate. Incidental CT findings: Gastrostomy tube in place. Calcified liver granulomas. Extensive aortic atherosclerosis. Nodular appearing prostate gland with mass effect upon the bladder base. Left-sided hydrocele identified. Extensive colonic diverticulosis. SKELETON: No focal hypermetabolic activity to suggest skeletal metastasis. Incidental CT findings: none IMPRESSION: 1. Focal area of increased uptake at or just below the level of the GE junction is identified compatible with primary esophageal neoplasm. 2. Moderate FDG uptake associated with left apical lung nodule identified. This is concerning for either metastatic disease from esophageal cancer or synchronous primary neoplasm of the lung. 3. Aortic atherosclerosis and multi vessel coronary artery calcifications. Aortic Atherosclerosis (ICD10-I70.0). 4. Bilateral pleural effusions and lower lobe atelectasis. 5. Prior granulomatous disease. Electronically Signed   By: Lovena Le  Clovis Riley M.D.   On: 12/19/2018 09:05    Medications: I have reviewed the patient's  current medications.  Assessment/Plan: 1. 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 Partially obstructing esophagus mass 02/01/2017, biopsy confirmed moderately differentiated adenocarcinoma ? Staging CTs 03/06/2017-esophagus mass, single noncalcified pulmonary nodule the medial aspect of the left lower lobe, evidence of prior granulomatous disease, right adrenal adenoma ? PET scan 04/07/2017-hypermetabolic distal esophageal mass, no evidence of metastatic disease in the abdomen, pelvis, or skeleton.  The right adrenal mass on CT was not hypermetabolic and favored to be an adenoma ? Radiation and concurrent weekly Taxol/carboplatin 04/20/2017- 05/28/2017 ? CTs 07/05/2018-improvement in esophagus mass ? Endoscopy 07/26/2017-ulceration and friability from the GE junction up to 35 cm, 2 cm mass at the GE junction-improved in size, biopsy revealed no malignancy ? PET scan 50/06/3817- hypermetabolic activity at the distal esophagus and GE junction, no evidence of distant metastatic disease ? Admission to [facility] 11/18/2018 with a syncope event secondary to upper GI bleeding ? Upper endoscopy 11/18/2018- obstructing mass at 40 cm, biopsies revealed inflammation and no malignancy ? CTs 11/20/2018- 10 mm nodule at the left lung apex, thickening at the distal esophagus, evidence of prior granulomatous disease with calcified right lung nodules and right paratracheal/hilar lymph nodes ? Upper endoscopy 12/07/2026- pinpoint stricture in the distal esophagus, lumen was 1 mm.  Gastroesophageal mucosa with exudate consistent with ulcer.  No malignancy.   ? PET scan 12/19/2018- focal area of increased uptake at or just below the level of the GE junction; moderate FDG uptake associated with left apical lung nodule.  Bilateral pleural effusions and lower lobe atelectasis. ? Endoscopy via gastrostomy tube tract 01/05/2019- mass at the gastric cardia/GE junction, biopsy confirmed  adenocarcinoma 2. Upper GI bleeding secondary to #1, 11/18/2018-transfused with a total of 4 units of packed red blood cells 3. Nutrition- placement of gastrostomy feeding tube 11/22/2018  Feeding tube placed initial treatment in 2018, removed May 2019  4.   Diabetes 5.   Hypertension 6.   Gout 7.   Chronic renal failure    Disposition: Lance Herring appears stable.  The upper endoscopy 01/05/2019 confirmed a mass at the gastric cardia/GE junction.  A biopsy is consistent with adenocarcinoma.  Lance Herring has been diagnosed with recurrent esophagus cancer.  He has a hypermetabolic left apical nodule, potentially related to metastatic gastroesophageal cancer versus a lung primary.  I discussed treatment options with Lance Herring and his wife.  I will review the case with Dr. Servando Snare to confirm he is not a surgical candidate.  If not, I recommend FOLFOX chemotherapy.  We will submit the 01/05/2019 biopsy material for molecular testing and PD1 testing.  He may be a candidate for Herceptin or immunotherapy.  We reviewed potential toxicities associated with the FOLFOX regimen including the chance for nausea/vomiting, mucositis, diarrhea, and hematologic toxicity.  We discussed the sun sensitivity, rash, hyperpigmentation, and hand/foot syndrome associated with 5-fluorouracil.  We reviewed the allergic reaction and various types of neuropathy associated with oxaliplatin.  He will attend a chemotherapy teaching class.  The plan is to begin FOLFOX on 01/19/2019.  He will return for an office visit prior to cycle 2 chemotherapy.  We prescribed EMLA cream and Phenergan.  A chemotherapy plan was entered today.  40 minutes were spent with the patient today.  The majority of the time was used for counseling and coordination of care.  Betsy Coder, MD  01/12/2019  10:22 AM

## 2019-01-12 NOTE — Telephone Encounter (Signed)
Scheduled appt per 3/19 los. ° °Printed calendar and avs. °

## 2019-01-15 ENCOUNTER — Other Ambulatory Visit: Payer: Self-pay | Admitting: Oncology

## 2019-01-17 ENCOUNTER — Ambulatory Visit: Payer: Medicare Other | Admitting: Nutrition

## 2019-01-17 ENCOUNTER — Inpatient Hospital Stay: Payer: Medicare Other

## 2019-01-17 ENCOUNTER — Other Ambulatory Visit: Payer: Self-pay

## 2019-01-17 DIAGNOSIS — C155 Malignant neoplasm of lower third of esophagus: Secondary | ICD-10-CM

## 2019-01-17 DIAGNOSIS — Z5111 Encounter for antineoplastic chemotherapy: Secondary | ICD-10-CM | POA: Diagnosis not present

## 2019-01-17 LAB — CMP (CANCER CENTER ONLY)
ALBUMIN: 3.4 g/dL — AB (ref 3.5–5.0)
ALT: 34 U/L (ref 0–44)
AST: 28 U/L (ref 15–41)
Alkaline Phosphatase: 141 U/L — ABNORMAL HIGH (ref 38–126)
Anion gap: 10 (ref 5–15)
BILIRUBIN TOTAL: 0.4 mg/dL (ref 0.3–1.2)
BUN: 27 mg/dL — ABNORMAL HIGH (ref 8–23)
CO2: 27 mmol/L (ref 22–32)
Calcium: 9 mg/dL (ref 8.9–10.3)
Chloride: 104 mmol/L (ref 98–111)
Creatinine: 1.35 mg/dL — ABNORMAL HIGH (ref 0.61–1.24)
GFR, Est AFR Am: >60 mL/min (ref >60)
GFR, Estimated: 53 mL/min — ABNORMAL LOW (ref >60)
Glucose, Bld: 119 mg/dL — ABNORMAL HIGH (ref 70–99)
Potassium: 3.8 mmol/L (ref 3.5–5.1)
Sodium: 141 mmol/L (ref 135–145)
TOTAL PROTEIN: 7.4 g/dL (ref 6.5–8.1)

## 2019-01-17 LAB — CBC WITH DIFFERENTIAL (CANCER CENTER ONLY)
Abs Immature Granulocytes: 0.01 10*3/uL (ref 0.00–0.07)
BASOS PCT: 0 %
Basophils Absolute: 0 10*3/uL (ref 0.0–0.1)
Eosinophils Absolute: 0.1 10*3/uL (ref 0.0–0.5)
Eosinophils Relative: 2 %
HCT: 29.4 % — ABNORMAL LOW (ref 39.0–52.0)
Hemoglobin: 9.5 g/dL — ABNORMAL LOW (ref 13.0–17.0)
Immature Granulocytes: 0 %
Lymphocytes Relative: 13 %
Lymphs Abs: 0.9 10*3/uL (ref 0.7–4.0)
MCH: 29.8 pg (ref 26.0–34.0)
MCHC: 32.3 g/dL (ref 30.0–36.0)
MCV: 92.2 fL (ref 80.0–100.0)
Monocytes Absolute: 0.8 10*3/uL (ref 0.1–1.0)
Monocytes Relative: 11 %
NEUTROS ABS: 5.1 10*3/uL (ref 1.7–7.7)
Neutrophils Relative %: 74 %
Platelet Count: 246 10*3/uL (ref 150–400)
RBC: 3.19 MIL/uL — AB (ref 4.22–5.81)
RDW: 15.8 % — ABNORMAL HIGH (ref 11.5–15.5)
WBC Count: 7 10*3/uL (ref 4.0–10.5)
nRBC: 0 % (ref 0.0–0.2)

## 2019-01-17 LAB — CEA (IN HOUSE-CHCC): CEA (CHCC-IN HOUSE): 5.1 ng/mL — AB (ref 0.00–5.00)

## 2019-01-17 MED ORDER — SODIUM CHLORIDE 0.9% FLUSH
10.0000 mL | Freq: Once | INTRAVENOUS | Status: AC
  Administered 2019-01-17: 10 mL via INTRAVENOUS
  Filled 2019-01-17: qty 10

## 2019-01-17 MED ORDER — HEPARIN SOD (PORK) LOCK FLUSH 100 UNIT/ML IV SOLN
500.0000 [IU] | Freq: Once | INTRAVENOUS | Status: AC
  Administered 2019-01-17: 500 [IU] via INTRAVENOUS
  Filled 2019-01-17: qty 5

## 2019-01-17 NOTE — Progress Notes (Signed)
RD working remotely.  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 He is a patient of Dr. Benay Spice.  PMH includes DM, HTN, Gout, and CRI.  Medications include amaryl, actos, and phenergan.  Labs include Glucose 119, BUN 27, Creatinine 1.35, and Albumin 3.4.  Height: 5'11" Weight: 180.6 pounds. BMI: 25.19  Patient reports tube feeding is going well. He had a PEG placed January 28. Reports he uses 2 cans Jevity 1.2 QID with 12 oz water each feeding. He is able to swallow water and coffee. Patient denies nausea, vomiting, constipation, and diarrhea. Reports good glycemic control. He is concerned he may run out of distilled water.  Estimated nutrition needs: 2000-2200 kcal, 95-110 grams protein, greater than 2 L fluid.  Nutrition Diagnosis: Inadequate oral intake related to esophageal cancer as evidenced by need for enteral nutrition support.  Intervention: Educated patient to continue current TF which provides 2275 kcal, 105 gm protein. Educated patient he could use city tap water via PEG. Explained only needed bottled water if he has a well. Recommended patient continue checking blood sugars. Provided office number for nutrition questions or concerns.  Monitoring, Evaluation, Goals: Patient will tolerate TF and free water to meet greater than 90% estimated needs for weight maintenance.  Next Visit: To be scheduled as needed.

## 2019-01-18 ENCOUNTER — Telehealth: Payer: Self-pay | Admitting: *Deleted

## 2019-01-18 ENCOUNTER — Inpatient Hospital Stay: Payer: Medicare Other

## 2019-01-18 ENCOUNTER — Encounter: Payer: Medicare Other | Admitting: Nutrition

## 2019-01-18 VITALS — BP 151/69 | HR 79 | Temp 98.2°F | Resp 18

## 2019-01-18 DIAGNOSIS — Z5111 Encounter for antineoplastic chemotherapy: Secondary | ICD-10-CM | POA: Diagnosis not present

## 2019-01-18 DIAGNOSIS — C155 Malignant neoplasm of lower third of esophagus: Secondary | ICD-10-CM

## 2019-01-18 MED ORDER — HEPARIN SOD (PORK) LOCK FLUSH 100 UNIT/ML IV SOLN
500.0000 [IU] | Freq: Once | INTRAVENOUS | Status: DC | PRN
  Filled 2019-01-18: qty 5

## 2019-01-18 MED ORDER — DEXTROSE 5 % IV SOLN
Freq: Once | INTRAVENOUS | Status: AC
  Administered 2019-01-18: 08:00:00 via INTRAVENOUS
  Filled 2019-01-18: qty 250

## 2019-01-18 MED ORDER — OXALIPLATIN CHEMO INJECTION 100 MG/20ML
85.0000 mg/m2 | Freq: Once | INTRAVENOUS | Status: AC
  Administered 2019-01-18: 175 mg via INTRAVENOUS
  Filled 2019-01-18: qty 35

## 2019-01-18 MED ORDER — LEUCOVORIN CALCIUM INJECTION 350 MG
400.0000 mg/m2 | Freq: Once | INTRAVENOUS | Status: AC
  Administered 2019-01-18: 812 mg via INTRAVENOUS
  Filled 2019-01-18: qty 40.6

## 2019-01-18 MED ORDER — PALONOSETRON HCL INJECTION 0.25 MG/5ML
INTRAVENOUS | Status: AC
  Filled 2019-01-18: qty 5

## 2019-01-18 MED ORDER — DEXTROSE 5 % IV SOLN
Freq: Once | INTRAVENOUS | Status: AC
  Administered 2019-01-18: 09:00:00 via INTRAVENOUS
  Filled 2019-01-18: qty 250

## 2019-01-18 MED ORDER — SODIUM CHLORIDE 0.9 % IV SOLN
2475.0000 mg/m2 | INTRAVENOUS | Status: DC
  Administered 2019-01-18: 5000 mg via INTRAVENOUS
  Filled 2019-01-18: qty 100

## 2019-01-18 MED ORDER — DEXAMETHASONE SODIUM PHOSPHATE 10 MG/ML IJ SOLN
10.0000 mg | Freq: Once | INTRAMUSCULAR | Status: AC
  Administered 2019-01-18: 10 mg via INTRAVENOUS

## 2019-01-18 MED ORDER — PALONOSETRON HCL INJECTION 0.25 MG/5ML
0.2500 mg | Freq: Once | INTRAVENOUS | Status: AC
  Administered 2019-01-18: 0.25 mg via INTRAVENOUS

## 2019-01-18 MED ORDER — DEXAMETHASONE SODIUM PHOSPHATE 10 MG/ML IJ SOLN
INTRAMUSCULAR | Status: AC
  Filled 2019-01-18: qty 1

## 2019-01-18 MED ORDER — FLUOROURACIL CHEMO INJECTION 2.5 GM/50ML
400.0000 mg/m2 | Freq: Once | INTRAVENOUS | Status: AC
  Administered 2019-01-18: 800 mg via INTRAVENOUS
  Filled 2019-01-18: qty 16

## 2019-01-18 MED ORDER — SODIUM CHLORIDE 0.9% FLUSH
10.0000 mL | INTRAVENOUS | Status: DC | PRN
  Filled 2019-01-18: qty 10

## 2019-01-18 NOTE — Patient Instructions (Signed)
[facility] Discharge Instructions for Patients Receiving Chemotherapy  Today you received the following chemotherapy agents Oxaliplatin, Leucovorin, 5FU  To help prevent nausea and vomiting after your treatment, we encourage you to take your nausea medication as prescribed.   If you develop nausea and vomiting that is not controlled by your nausea medication, call the clinic.   BELOW ARE SYMPTOMS THAT SHOULD BE REPORTED IMMEDIATELY:  *FEVER GREATER THAN 100.5 F  *CHILLS WITH OR WITHOUT FEVER  NAUSEA AND VOMITING THAT IS NOT CONTROLLED WITH YOUR NAUSEA MEDICATION  *UNUSUAL SHORTNESS OF BREATH  *UNUSUAL BRUISING OR BLEEDING  TENDERNESS IN MOUTH AND THROAT WITH OR WITHOUT PRESENCE OF ULCERS  *URINARY PROBLEMS  *BOWEL PROBLEMS  UNUSUAL RASH Items with * indicate a potential emergency and should be followed up as soon as possible.  Feel free to call the clinic should you have any questions or concerns. The clinic phone number is [phone].  Please show the CHEMO ALERT CARD at check-in to the Emergency Department and triage nurse.  Oxaliplatin Injection What is this medicine? OXALIPLATIN (ox AL i PLA tin) is a chemotherapy drug. It targets fast dividing cells, like cancer cells, and causes these cells to die. This medicine is used to treat cancers of the colon and rectum, and many other cancers. This medicine may be used for other purposes; ask your health care provider or pharmacist if you have questions. COMMON BRAND NAME(S): Eloxatin What should I tell my health care provider before I take this medicine? They need to know if you have any of these conditions: -kidney disease -an unusual or allergic reaction to oxaliplatin, other chemotherapy, other medicines, foods, dyes, or preservatives -pregnant or trying to get pregnant -breast-feeding How should I use this medicine? This drug is given as an infusion into a vein. It is administered in a hospital or  clinic by a specially trained health care professional. Talk to your pediatrician regarding the use of this medicine in children. Special care may be needed. Overdosage: If you think you have taken too much of this medicine contact a poison control center or emergency room at once. NOTE: This medicine is only for you. Do not share this medicine with others. What if I miss a dose? It is important not to miss a dose. Call your doctor or health care professional if you are unable to keep an appointment. What may interact with this medicine? -medicines to increase blood counts like filgrastim, pegfilgrastim, sargramostim -probenecid -some antibiotics like amikacin, gentamicin, neomycin, polymyxin B, streptomycin, tobramycin -zalcitabine Talk to your doctor or health care professional before taking any of these medicines: -acetaminophen -aspirin -ibuprofen -ketoprofen -naproxen This list may not describe all possible interactions. Give your health care provider a list of all the medicines, herbs, non-prescription drugs, or dietary supplements you use. Also tell them if you smoke, drink alcohol, or use illegal drugs. Some items may interact with your medicine. What should I watch for while using this medicine? Your condition will be monitored carefully while you are receiving this medicine. You will need important blood work done while you are taking this medicine. This medicine can make you more sensitive to cold. Do not drink cold drinks or use ice. Cover exposed skin before coming in contact with cold temperatures or cold objects. When out in cold weather wear warm clothing and cover your mouth and nose to warm the air that goes into your lungs. Tell your doctor if you get sensitive to the cold. This   drug may make you feel generally unwell. This is not uncommon, as chemotherapy can affect healthy cells as well as cancer cells. Report any side effects. Continue your course of treatment even though  you feel ill unless your doctor tells you to stop. In some cases, you may be given additional medicines to help with side effects. Follow all directions for their use. Call your doctor or health care professional for advice if you get a fever, chills or sore throat, or other symptoms of a cold or flu. Do not treat yourself. This drug decreases your body's ability to fight infections. Try to avoid being around people who are sick. This medicine may increase your risk to bruise or bleed. Call your doctor or health care professional if you notice any unusual bleeding. Be careful brushing and flossing your teeth or using a toothpick because you may get an infection or bleed more easily. If you have any dental work done, tell your dentist you are receiving this medicine. Avoid taking products that contain aspirin, acetaminophen, ibuprofen, naproxen, or ketoprofen unless instructed by your doctor. These medicines may hide a fever. Do not become pregnant while taking this medicine. Women should inform their doctor if they wish to become pregnant or think they might be pregnant. There is a potential for serious side effects to an unborn child. Talk to your health care professional or pharmacist for more information. Do not breast-feed an infant while taking this medicine. Call your doctor or health care professional if you get diarrhea. Do not treat yourself. What side effects may I notice from receiving this medicine? Side effects that you should report to your doctor or health care professional as soon as possible: -allergic reactions like skin rash, itching or hives, swelling of the face, lips, or tongue -low blood counts - This drug may decrease the number of white blood cells, red blood cells and platelets. You may be at increased risk for infections and bleeding. -signs of infection - fever or chills, cough, sore throat, pain or difficulty passing urine -signs of decreased platelets or bleeding -  bruising, pinpoint red spots on the skin, black, tarry stools, nosebleeds -signs of decreased red blood cells - unusually weak or tired, fainting spells, lightheadedness -breathing problems -chest pain, pressure -cough -diarrhea -jaw tightness -mouth sores -nausea and vomiting -pain, swelling, redness or irritation at the injection site -pain, tingling, numbness in the hands or feet -problems with balance, talking, walking -redness, blistering, peeling or loosening of the skin, including inside the mouth -trouble passing urine or change in the amount of urine Side effects that usually do not require medical attention (report to your doctor or health care professional if they continue or are bothersome): -changes in vision -constipation -hair loss -loss of appetite -metallic taste in the mouth or changes in taste -stomach pain This list may not describe all possible side effects. Call your doctor for medical advice about side effects. You may report side effects to FDA at 1-800-FDA-1088. Where should I keep my medicine? This drug is given in a hospital or clinic and will not be stored at home. NOTE: This sheet is a summary. It may not cover all possible information. If you have questions about this medicine, talk to your doctor, pharmacist, or health care provider.  2019 Elsevier/Gold Standard (2008-05-08 17:22:47)  Leucovorin injection What is this medicine? LEUCOVORIN (loo koe VOR in) is used to prevent or treat the harmful effects of some medicines. This medicine is used to treat anemia   caused by a low amount of folic acid in the body. It is also used with 5-fluorouracil (5-FU) to treat colon cancer. This medicine may be used for other purposes; ask your health care provider or pharmacist if you have questions. What should I tell my health care provider before I take this medicine? They need to know if you have any of these conditions: -anemia from low levels of vitamin B-12 in  the blood -an unusual or allergic reaction to leucovorin, folic acid, other medicines, foods, dyes, or preservatives -pregnant or trying to get pregnant -breast-feeding How should I use this medicine? This medicine is for injection into a muscle or into a vein. It is given by a health care professional in a hospital or clinic setting. Talk to your pediatrician regarding the use of this medicine in children. Special care may be needed. Overdosage: If you think you have taken too much of this medicine contact a poison control center or emergency room at once. NOTE: This medicine is only for you. Do not share this medicine with others. What if I miss a dose? This does not apply. What may interact with this medicine? -capecitabine -fluorouracil -phenobarbital -phenytoin -primidone -trimethoprim-sulfamethoxazole This list may not describe all possible interactions. Give your health care provider a list of all the medicines, herbs, non-prescription drugs, or dietary supplements you use. Also tell them if you smoke, drink alcohol, or use illegal drugs. Some items may interact with your medicine. What should I watch for while using this medicine? Your condition will be monitored carefully while you are receiving this medicine. This medicine may increase the side effects of 5-fluorouracil, 5-FU. Tell your doctor or health care professional if you have diarrhea or mouth sores that do not get better or that get worse. What side effects may I notice from receiving this medicine? Side effects that you should report to your doctor or health care professional as soon as possible: -allergic reactions like skin rash, itching or hives, swelling of the face, lips, or tongue -breathing problems -fever, infection -mouth sores -unusual bleeding or bruising -unusually weak or tired Side effects that usually do not require medical attention (report to your doctor or health care professional if they continue or  are bothersome): -constipation or diarrhea -loss of appetite -nausea, vomiting This list may not describe all possible side effects. Call your doctor for medical advice about side effects. You may report side effects to FDA at 1-800-FDA-1088. Where should I keep my medicine? This drug is given in a hospital or clinic and will not be stored at home. NOTE: This sheet is a summary. It may not cover all possible information. If you have questions about this medicine, talk to your doctor, pharmacist, or health care provider.  2019 Elsevier/Gold Standard (2008-04-17 16:50:29)   Fluorouracil, 5-FU injection What is this medicine? FLUOROURACIL, 5-FU (flure oh YOOR a sil) is a chemotherapy drug. It slows the growth of cancer cells. This medicine is used to treat many types of cancer like breast cancer, colon or rectal cancer, pancreatic cancer, and stomach cancer. This medicine may be used for other purposes; ask your health care provider or pharmacist if you have questions. COMMON BRAND NAME(S): Adrucil What should I tell my health care provider before I take this medicine? They need to know if you have any of these conditions: -blood disorders -dihydropyrimidine dehydrogenase (DPD) deficiency -infection (especially a virus infection such as chickenpox, cold sores, or herpes) -kidney disease -liver disease -malnourished, poor nutrition -recent or   ongoing radiation therapy -an unusual or allergic reaction to fluorouracil, other chemotherapy, other medicines, foods, dyes, or preservatives -pregnant or trying to get pregnant -breast-feeding How should I use this medicine? This drug is given as an infusion or injection into a vein. It is administered in a hospital or clinic by a specially trained health care professional. Talk to your pediatrician regarding the use of this medicine in children. Special care may be needed. Overdosage: If you think you have taken too much of this medicine contact a  poison control center or emergency room at once. NOTE: This medicine is only for you. Do not share this medicine with others. What if I miss a dose? It is important not to miss your dose. Call your doctor or health care professional if you are unable to keep an appointment. What may interact with this medicine? -allopurinol -cimetidine -dapsone -digoxin -hydroxyurea -leucovorin -levamisole -medicines for seizures like ethotoin, fosphenytoin, phenytoin -medicines to increase blood counts like filgrastim, pegfilgrastim, sargramostim -medicines that treat or prevent blood clots like warfarin, enoxaparin, and dalteparin -methotrexate -metronidazole -pyrimethamine -some other chemotherapy drugs like busulfan, cisplatin, estramustine, vinblastine -trimethoprim -trimetrexate -vaccines Talk to your doctor or health care professional before taking any of these medicines: -acetaminophen -aspirin -ibuprofen -ketoprofen -naproxen This list may not describe all possible interactions. Give your health care provider a list of all the medicines, herbs, non-prescription drugs, or dietary supplements you use. Also tell them if you smoke, drink alcohol, or use illegal drugs. Some items may interact with your medicine. What should I watch for while using this medicine? Visit your doctor for checks on your progress. This drug may make you feel generally unwell. This is not uncommon, as chemotherapy can affect healthy cells as well as cancer cells. Report any side effects. Continue your course of treatment even though you feel ill unless your doctor tells you to stop. In some cases, you may be given additional medicines to help with side effects. Follow all directions for their use. Call your doctor or health care professional for advice if you get a fever, chills or sore throat, or other symptoms of a cold or flu. Do not treat yourself. This drug decreases your body's ability to fight infections. Try to  avoid being around people who are sick. This medicine may increase your risk to bruise or bleed. Call your doctor or health care professional if you notice any unusual bleeding. Be careful brushing and flossing your teeth or using a toothpick because you may get an infection or bleed more easily. If you have any dental work done, tell your dentist you are receiving this medicine. Avoid taking products that contain aspirin, acetaminophen, ibuprofen, naproxen, or ketoprofen unless instructed by your doctor. These medicines may hide a fever. Do not become pregnant while taking this medicine. Women should inform their doctor if they wish to become pregnant or think they might be pregnant. There is a potential for serious side effects to an unborn child. Talk to your health care professional or pharmacist for more information. Do not breast-feed an infant while taking this medicine. Men should inform their doctor if they wish to father a child. This medicine may lower sperm counts. Do not treat diarrhea with over the counter products. Contact your doctor if you have diarrhea that lasts more than 2 days or if it is severe and watery. This medicine can make you more sensitive to the sun. Keep out of the sun. If you cannot avoid being in the sun,   wear protective clothing and use sunscreen. Do not use sun lamps or tanning beds/booths. What side effects may I notice from receiving this medicine? Side effects that you should report to your doctor or health care professional as soon as possible: -allergic reactions like skin rash, itching or hives, swelling of the face, lips, or tongue -low blood counts - this medicine may decrease the number of white blood cells, red blood cells and platelets. You may be at increased risk for infections and bleeding. -signs of infection - fever or chills, cough, sore throat, pain or difficulty passing urine -signs of decreased platelets or bleeding - bruising, pinpoint red spots  on the skin, black, tarry stools, blood in the urine -signs of decreased red blood cells - unusually weak or tired, fainting spells, lightheadedness -breathing problems -changes in vision -chest pain -mouth sores -nausea and vomiting -pain, swelling, redness at site where injected -pain, tingling, numbness in the hands or feet -redness, swelling, or sores on hands or feet -stomach pain -unusual bleeding Side effects that usually do not require medical attention (report to your doctor or health care professional if they continue or are bothersome): -changes in finger or toe nails -diarrhea -dry or itchy skin -hair loss -headache -loss of appetite -sensitivity of eyes to the light -stomach upset -unusually teary eyes This list may not describe all possible side effects. Call your doctor for medical advice about side effects. You may report side effects to FDA at 1-800-FDA-1088. Where should I keep my medicine? This drug is given in a hospital or clinic and will not be stored at home. NOTE: This sheet is a summary. It may not cover all possible information. If you have questions about this medicine, talk to your doctor, pharmacist, or health care provider.  2019 Elsevier/Gold Standard (2008-02-15 13:53:16)  

## 2019-01-18 NOTE — Telephone Encounter (Signed)
Called requesting stage of disease and disease status before they can process the sample Per Dr. Benay Spice: Stage II and status is "recurrent".

## 2019-01-20 ENCOUNTER — Other Ambulatory Visit: Payer: Self-pay

## 2019-01-20 ENCOUNTER — Inpatient Hospital Stay: Payer: Medicare Other

## 2019-01-20 VITALS — BP 146/66 | HR 80 | Temp 98.2°F | Resp 18

## 2019-01-20 DIAGNOSIS — Z5111 Encounter for antineoplastic chemotherapy: Secondary | ICD-10-CM | POA: Diagnosis not present

## 2019-01-20 DIAGNOSIS — C155 Malignant neoplasm of lower third of esophagus: Secondary | ICD-10-CM

## 2019-01-20 MED ORDER — HEPARIN SOD (PORK) LOCK FLUSH 100 UNIT/ML IV SOLN
500.0000 [IU] | Freq: Once | INTRAVENOUS | Status: AC | PRN
  Administered 2019-01-20: 500 [IU]
  Filled 2019-01-20: qty 5

## 2019-01-20 MED ORDER — SODIUM CHLORIDE 0.9% FLUSH
10.0000 mL | INTRAVENOUS | Status: DC | PRN
  Administered 2019-01-20: 10 mL
  Filled 2019-01-20: qty 10

## 2019-01-26 ENCOUNTER — Encounter (HOSPITAL_COMMUNITY): Payer: Self-pay | Admitting: Oncology

## 2019-01-29 ENCOUNTER — Other Ambulatory Visit: Payer: Self-pay | Admitting: Oncology

## 2019-02-01 ENCOUNTER — Encounter: Payer: Self-pay | Admitting: Oncology

## 2019-02-01 ENCOUNTER — Ambulatory Visit: Payer: Medicare Other | Admitting: Nutrition

## 2019-02-01 NOTE — Progress Notes (Signed)
Nutrition follow-up completed with patient being treated for gastric mass/esophageal cancer. There is no new weight or new labs since March 24. Patient reports his tube feeding continues to go well. He is using 2 bottles Jevity 1.2-4 times a day with 12 ounces of water at each feeding. He denies nausea, vomiting, constipation, diarrhea. He has no questions or concerns. He has adequate supplies and formula.  Estimated nutrition needs: 2000-2200 cal, 95-110 g protein greater than 2 L fluid.  Nutrition diagnosis: Inadequate oral intake continues.  Intervention: Educated patient to continue strategies for current tube feeding providing 2275 cal and 105 g protein meeting 100% estimated needs. Encourage patient to contact me if he develops questions or concerns.  Monitoring, evaluation, goals: Patient will continue to tolerate tube feeding for weight maintenance.  Next visit: To be scheduled as needed.  **Disclaimer: This note was dictated with voice recognition software. Similar sounding words can inadvertently be transcribed and this note may contain transcription errors which may not have been corrected upon publication of note.**

## 2019-02-01 NOTE — Progress Notes (Signed)
Called pt to introduce myself as his Arboriculturist.  Unfortunately there aren't any foundations offering copay assistance for his Dx and the type of ins he has.  I offered the [place] and went over what it covers.  Pt declined at this time.  I will meet him on 02/02/19 to give him my card in case he changes his mind and for any questions or concerns he may have in the future.

## 2019-02-02 ENCOUNTER — Other Ambulatory Visit: Payer: Self-pay

## 2019-02-02 ENCOUNTER — Inpatient Hospital Stay: Payer: Medicare Other

## 2019-02-02 ENCOUNTER — Inpatient Hospital Stay (HOSPITAL_BASED_OUTPATIENT_CLINIC_OR_DEPARTMENT_OTHER): Payer: Medicare Other | Admitting: Oncology

## 2019-02-02 ENCOUNTER — Inpatient Hospital Stay: Payer: Medicare Other | Attending: Oncology

## 2019-02-02 ENCOUNTER — Telehealth: Payer: Self-pay | Admitting: Oncology

## 2019-02-02 VITALS — BP 158/73 | HR 86 | Temp 98.3°F | Resp 19 | Ht 70.0 in | Wt 182.0 lb

## 2019-02-02 DIAGNOSIS — Z5111 Encounter for antineoplastic chemotherapy: Secondary | ICD-10-CM | POA: Insufficient documentation

## 2019-02-02 DIAGNOSIS — J90 Pleural effusion, not elsewhere classified: Secondary | ICD-10-CM | POA: Diagnosis not present

## 2019-02-02 DIAGNOSIS — I129 Hypertensive chronic kidney disease with stage 1 through stage 4 chronic kidney disease, or unspecified chronic kidney disease: Secondary | ICD-10-CM

## 2019-02-02 DIAGNOSIS — C160 Malignant neoplasm of cardia: Secondary | ICD-10-CM | POA: Insufficient documentation

## 2019-02-02 DIAGNOSIS — Z452 Encounter for adjustment and management of vascular access device: Secondary | ICD-10-CM | POA: Insufficient documentation

## 2019-02-02 DIAGNOSIS — Z95828 Presence of other vascular implants and grafts: Secondary | ICD-10-CM | POA: Insufficient documentation

## 2019-02-02 DIAGNOSIS — C155 Malignant neoplasm of lower third of esophagus: Secondary | ICD-10-CM

## 2019-02-02 DIAGNOSIS — E1122 Type 2 diabetes mellitus with diabetic chronic kidney disease: Secondary | ICD-10-CM

## 2019-02-02 DIAGNOSIS — R911 Solitary pulmonary nodule: Secondary | ICD-10-CM

## 2019-02-02 DIAGNOSIS — N189 Chronic kidney disease, unspecified: Secondary | ICD-10-CM

## 2019-02-02 DIAGNOSIS — M109 Gout, unspecified: Secondary | ICD-10-CM

## 2019-02-02 LAB — CBC WITH DIFFERENTIAL (CANCER CENTER ONLY)
Abs Immature Granulocytes: 0.01 10*3/uL (ref 0.00–0.07)
Basophils Absolute: 0 10*3/uL (ref 0.0–0.1)
Basophils Relative: 0 %
Eosinophils Absolute: 0.1 10*3/uL (ref 0.0–0.5)
Eosinophils Relative: 1 %
HCT: 31.9 % — ABNORMAL LOW (ref 39.0–52.0)
Hemoglobin: 10.1 g/dL — ABNORMAL LOW (ref 13.0–17.0)
Immature Granulocytes: 0 %
Lymphocytes Relative: 15 %
Lymphs Abs: 0.8 10*3/uL (ref 0.7–4.0)
MCH: 29.2 pg (ref 26.0–34.0)
MCHC: 31.7 g/dL (ref 30.0–36.0)
MCV: 92.2 fL (ref 80.0–100.0)
Monocytes Absolute: 1 10*3/uL (ref 0.1–1.0)
Monocytes Relative: 18 %
Neutro Abs: 3.7 10*3/uL (ref 1.7–7.7)
Neutrophils Relative %: 66 %
Platelet Count: 237 10*3/uL (ref 150–400)
RBC: 3.46 MIL/uL — ABNORMAL LOW (ref 4.22–5.81)
RDW: 15.9 % — ABNORMAL HIGH (ref 11.5–15.5)
WBC Count: 5.6 10*3/uL (ref 4.0–10.5)
nRBC: 0 % (ref 0.0–0.2)

## 2019-02-02 LAB — CMP (CANCER CENTER ONLY)
ALT: 22 U/L (ref 0–44)
AST: 24 U/L (ref 15–41)
Albumin: 3.6 g/dL (ref 3.5–5.0)
Alkaline Phosphatase: 125 U/L (ref 38–126)
Anion gap: 9 (ref 5–15)
BUN: 29 mg/dL — ABNORMAL HIGH (ref 8–23)
CO2: 28 mmol/L (ref 22–32)
Calcium: 9.3 mg/dL (ref 8.9–10.3)
Chloride: 103 mmol/L (ref 98–111)
Creatinine: 1.52 mg/dL — ABNORMAL HIGH (ref 0.61–1.24)
GFR, Est AFR Am: 53 mL/min — ABNORMAL LOW (ref >60)
GFR, Estimated: 46 mL/min — ABNORMAL LOW (ref >60)
Glucose, Bld: 150 mg/dL — ABNORMAL HIGH (ref 70–99)
Potassium: 3.7 mmol/L (ref 3.5–5.1)
Sodium: 140 mmol/L (ref 135–145)
Total Bilirubin: 0.4 mg/dL (ref 0.3–1.2)
Total Protein: 7.3 g/dL (ref 6.5–8.1)

## 2019-02-02 MED ORDER — DEXTROSE 5 % IV SOLN
Freq: Once | INTRAVENOUS | Status: AC
  Administered 2019-02-02: 11:00:00 via INTRAVENOUS
  Filled 2019-02-02: qty 250

## 2019-02-02 MED ORDER — LEUCOVORIN CALCIUM INJECTION 350 MG
400.0000 mg/m2 | Freq: Once | INTRAVENOUS | Status: AC
  Administered 2019-02-02: 12:00:00 812 mg via INTRAVENOUS
  Filled 2019-02-02: qty 40.6

## 2019-02-02 MED ORDER — DEXAMETHASONE SODIUM PHOSPHATE 10 MG/ML IJ SOLN
10.0000 mg | Freq: Once | INTRAMUSCULAR | Status: AC
  Administered 2019-02-02: 11:00:00 10 mg via INTRAVENOUS

## 2019-02-02 MED ORDER — PALONOSETRON HCL INJECTION 0.25 MG/5ML
0.2500 mg | Freq: Once | INTRAVENOUS | Status: AC
  Administered 2019-02-02: 11:00:00 0.25 mg via INTRAVENOUS

## 2019-02-02 MED ORDER — FLUOROURACIL CHEMO INJECTION 2.5 GM/50ML
400.0000 mg/m2 | Freq: Once | INTRAVENOUS | Status: AC
  Administered 2019-02-02: 14:00:00 800 mg via INTRAVENOUS
  Filled 2019-02-02: qty 16

## 2019-02-02 MED ORDER — SODIUM CHLORIDE 0.9 % IV SOLN
2475.0000 mg/m2 | INTRAVENOUS | Status: DC
  Administered 2019-02-02: 14:00:00 5000 mg via INTRAVENOUS
  Filled 2019-02-02: qty 100

## 2019-02-02 MED ORDER — SODIUM CHLORIDE 0.9% FLUSH
10.0000 mL | INTRAVENOUS | Status: DC | PRN
  Administered 2019-02-02: 10 mL
  Filled 2019-02-02: qty 10

## 2019-02-02 MED ORDER — OXALIPLATIN CHEMO INJECTION 100 MG/20ML
85.0000 mg/m2 | Freq: Once | INTRAVENOUS | Status: AC
  Administered 2019-02-02: 12:00:00 175 mg via INTRAVENOUS
  Filled 2019-02-02: qty 35

## 2019-02-02 MED ORDER — DEXAMETHASONE SODIUM PHOSPHATE 10 MG/ML IJ SOLN
INTRAMUSCULAR | Status: AC
  Filled 2019-02-02: qty 1

## 2019-02-02 MED ORDER — PALONOSETRON HCL INJECTION 0.25 MG/5ML
INTRAVENOUS | Status: AC
  Filled 2019-02-02: qty 5

## 2019-02-02 NOTE — Patient Instructions (Signed)
Coronavirus (COVID-19) Are you at risk?  Are you at risk for the Coronavirus (COVID-19)?  To be considered HIGH RISK for Coronavirus (COVID-19), you have to meet the following criteria:  . Traveled to [place], [place], [place], [place] or [place]; or in the [place] to [place], [place], [place], or [place]; and have fever, cough, and shortness of breath within the last 2 weeks of travel OR . Been in close contact with a person diagnosed with COVID-19 within the last 2 weeks and have fever, cough, and shortness of breath . IF YOU DO NOT MEET THESE CRITERIA, YOU ARE CONSIDERED LOW RISK FOR COVID-19.  What to do if you are HIGH RISK for COVID-19?  . If you are having a medical emergency, call 911. . Seek medical care right away. Before you go to a doctor's office, urgent care or emergency department, call ahead and tell them about your recent travel, contact with someone diagnosed with COVID-19, and your symptoms. You should receive instructions from your physician's office regarding next steps of care.  . When you arrive at healthcare provider, tell the healthcare staff immediately you have returned from visiting [place], [place], [place], [place] or [place]; or traveled in the [place] to [place], [place], [place], or [place]; in the last two weeks or you have been in close contact with a person diagnosed with COVID-19 in the last 2 weeks.   . Tell the health care staff about your symptoms: fever, cough and shortness of breath. . After you have been seen by a medical provider, you will be either: o Tested for (COVID-19) and discharged home on quarantine except to seek medical care if symptoms worsen, and asked to  - Stay home and avoid contact with others until you get your results (4-5 days)  - Avoid travel on public transportation if possible (such as bus, train, or airplane) or o Sent to the Emergency Department by EMS for evaluation, COVID-19 testing, and possible  admission depending on your condition and test results.  What to do if you are LOW RISK for COVID-19?  Reduce your risk of any infection by using the same precautions used for avoiding the common cold or flu:  . Wash your hands often with soap and warm water for at least 20 seconds.  If soap and water are not readily available, use an alcohol-based hand sanitizer with at least 60% alcohol.  . If coughing or sneezing, cover your mouth and nose by coughing or sneezing into the elbow areas of your shirt or coat, into a tissue or into your sleeve (not your hands). . Avoid shaking hands with others and consider head nods or verbal greetings only. . Avoid touching your eyes, nose, or mouth with unwashed hands.  . Avoid close contact with people who are sick. . Avoid places or events with large numbers of people in one location, like concerts or sporting events. . Carefully consider travel plans you have or are making. . If you are planning any travel outside or inside the [place], visit the CDC's Travelers' Health webpage for the latest health notices. . If you have some symptoms but not all symptoms, continue to monitor at home and seek medical attention if your symptoms worsen. . If you are having a medical emergency, call 911.   ADDITIONAL HEALTHCARE OPTIONS FOR PATIENTS  [facility] / e-Visit: [email or web address]         [facility]: [phone]  Onset   Urgent Care: [facility]: [place] Discharge Instructions for Patients Receiving Chemotherapy  Today you received the following chemotherapy agents: Oxaliplatin (Eloxatin), Leucovorin, Fluorouracil (Adrucil, 5-FU)  To help prevent nausea and vomiting after your treatment, we encourage you to take your nausea medication as directed.    If you develop nausea and vomiting that is not controlled by  your nausea medication, call the clinic.   BELOW ARE SYMPTOMS THAT SHOULD BE REPORTED IMMEDIATELY:  *FEVER GREATER THAN 100.5 F  *CHILLS WITH OR WITHOUT FEVER  NAUSEA AND VOMITING THAT IS NOT CONTROLLED WITH YOUR NAUSEA MEDICATION  *UNUSUAL SHORTNESS OF BREATH  *UNUSUAL BRUISING OR BLEEDING  TENDERNESS IN MOUTH AND THROAT WITH OR WITHOUT PRESENCE OF ULCERS  *URINARY PROBLEMS  *BOWEL PROBLEMS  UNUSUAL RASH Items with * indicate a potential emergency and should be followed up as soon as possible.  Feel free to call the clinic should you have any questions or concerns. The clinic phone number is [phone].  Please show the [place] at check-in to the Emergency Department and triage nurse.

## 2019-02-02 NOTE — Telephone Encounter (Signed)
Scheduled appt per 4/9 los. °

## 2019-02-02 NOTE — Progress Notes (Signed)
Per Dr. Benay Spice: OK to treat with Creatinine of 1.52

## 2019-02-02 NOTE — Progress Notes (Signed)
[facility] OFFICE PROGRESS NOTE   Diagnosis: Gastroesophageal cancer  INTERVAL HISTORY:   Lance Herring completed a first cycle of FOLFOX 01/18/2019.  No nausea, mouth sores, or diarrhea.  He reports cold sensitivity following chemotherapy.  This has resolved.  No neuropathy symptoms are present.  He is tolerating liquids.  He continues gastrostomy tube feedings.  No complaint.  Objective:  Vital signs in last 24 hours:  Blood pressure (!) 158/73, pulse 86, temperature 98.3 F (36.8 C), temperature source Oral, resp. rate 19, height 5\' 10"  (1.778 m), weight 182 lb (82.6 kg), SpO2 100 %.    HEENT: No thrush or ulcers   GI: Gastrostomy tube site without evidence of infection, no hepatomegaly Vascular: Trace edema at the right greater than left lower leg  Skin: Palms without erythema  Portacath/PICC-without erythema  Lab Results:  Lab Results  Component Value Date   WBC 5.6 02/02/2019   HGB 10.1 (L) 02/02/2019   HCT 31.9 (L) 02/02/2019   MCV 92.2 02/02/2019   PLT 237 02/02/2019   NEUTROABS 3.7 02/02/2019    CMP  Lab Results  Component Value Date   NA 141 01/17/2019   K 3.8 01/17/2019   CL 104 01/17/2019   CO2 27 01/17/2019   GLUCOSE 119 (H) 01/17/2019   BUN 27 (H) 01/17/2019   CREATININE 1.35 (H) 01/17/2019   CALCIUM 9.0 01/17/2019   PROT 7.4 01/17/2019   ALBUMIN 3.4 (L) 01/17/2019   AST 28 01/17/2019   ALT 34 01/17/2019   ALKPHOS 141 (H) 01/17/2019   BILITOT 0.4 01/17/2019   GFRNONAA 53 (L) 01/17/2019   GFRAA >60 01/17/2019    Lab Results  Component Value Date   CEA1 5.10 (H) 01/17/2019     Medications: I have reviewed the patient's current medications.   Assessment/Plan: 1. 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 Partially obstructing esophagus mass 02/01/2017, biopsy confirmed moderately differentiated adenocarcinoma ? Staging CTs 03/06/2017-esophagus mass, single noncalcified pulmonary nodule the  medial aspect of the left lower lobe, evidence of prior granulomatous disease, right adrenal adenoma ? PET scan 04/07/2017-hypermetabolic distal esophageal mass, no evidence of metastatic disease in the abdomen, pelvis, or skeleton.  The right adrenal mass on CT was not hypermetabolic and favored to be an adenoma ? Radiation and concurrent weekly Taxol/carboplatin 04/20/2017- 05/28/2017 ? CTs 07/05/2018-improvement in esophagus mass ? Endoscopy 07/26/2017-ulceration and friability from the GE junction up to 35 cm, 2 cm mass at the GE junction-improved in size, biopsy revealed no malignancy ? PET scan 45/80/9983- hypermetabolic activity at the distal esophagus and GE junction, no evidence of distant metastatic disease ? Admission to [facility] 11/18/2018 with a syncope event secondary to upper GI bleeding ? Upper endoscopy 11/18/2018- obstructing mass at 40 cm, biopsies revealed inflammation and no malignancy ? CTs 11/20/2018- 10 mm nodule at the left lung apex, thickening at the distal esophagus, evidence of prior granulomatous disease with calcified right lung nodules and right paratracheal/hilar lymph nodes ? Upper endoscopy 12/07/2026- pinpoint stricture in the distal esophagus, lumen was 1 mm.  Gastroesophageal mucosa with exudate consistent with ulcer.  No malignancy.   ? PET scan 12/19/2018- focal area of increased uptake at or just below the level of the GE junction; moderate FDG uptake associated with left apical lung nodule.  Bilateral pleural effusions and lower lobe atelectasis. ? Endoscopy via gastrostomy tube tract 01/05/2019- mass at the gastric cardia/GE junction, biopsy confirmed adenocarcinoma, CPS-5 ? Cycle 1 FOLFOX 01/18/2019 ?  Cycle 2 FOLFOX 02/02/2019 2. Upper GI bleeding secondary to #1, 11/18/2018-transfused with a total of 4 units of packed red blood cells 3. Nutrition- placement of gastrostomy feeding tube 11/22/2018  Feeding tube placed initial treatment in 2018, removed May 2019   4.   Diabetes 5.   Hypertension 6.   Gout 7.   Chronic renal failure     Disposition: Lance Herring appears stable.  He tolerated first cycle of FOLFOX well.  He will complete cycle 2 today.  He will return for an office visit and chemotherapy in 2 weeks.  Betsy Coder, MD  02/02/2019  10:33 AM

## 2019-02-04 ENCOUNTER — Other Ambulatory Visit: Payer: Self-pay

## 2019-02-04 ENCOUNTER — Inpatient Hospital Stay: Payer: Medicare Other

## 2019-02-04 VITALS — BP 149/81 | HR 84 | Temp 98.1°F | Resp 20

## 2019-02-04 DIAGNOSIS — Z5111 Encounter for antineoplastic chemotherapy: Secondary | ICD-10-CM | POA: Diagnosis not present

## 2019-02-04 DIAGNOSIS — C155 Malignant neoplasm of lower third of esophagus: Secondary | ICD-10-CM

## 2019-02-04 MED ORDER — HEPARIN SOD (PORK) LOCK FLUSH 100 UNIT/ML IV SOLN
500.0000 [IU] | Freq: Once | INTRAVENOUS | Status: AC | PRN
  Administered 2019-02-04: 500 [IU]
  Filled 2019-02-04: qty 5

## 2019-02-04 MED ORDER — SODIUM CHLORIDE 0.9% FLUSH
10.0000 mL | INTRAVENOUS | Status: DC | PRN
  Administered 2019-02-04: 10 mL
  Filled 2019-02-04: qty 10

## 2019-02-06 ENCOUNTER — Telehealth: Payer: Self-pay | Admitting: *Deleted

## 2019-02-06 NOTE — Telephone Encounter (Signed)
Called patient to inquire if he would like his current home health agency d/c his infusion pump on Saturdays and flush and deaccess his port. He agreed he would like this to be done in home. [facility] and they requested order be faxed and they will provide service for him.

## 2019-02-13 ENCOUNTER — Other Ambulatory Visit: Payer: Self-pay | Admitting: Oncology

## 2019-02-16 ENCOUNTER — Inpatient Hospital Stay: Payer: Medicare Other

## 2019-02-16 ENCOUNTER — Telehealth: Payer: Self-pay

## 2019-02-16 ENCOUNTER — Encounter: Payer: Self-pay | Admitting: Nurse Practitioner

## 2019-02-16 ENCOUNTER — Inpatient Hospital Stay (HOSPITAL_BASED_OUTPATIENT_CLINIC_OR_DEPARTMENT_OTHER): Payer: Medicare Other | Admitting: Nurse Practitioner

## 2019-02-16 ENCOUNTER — Other Ambulatory Visit: Payer: Self-pay

## 2019-02-16 VITALS — BP 162/89 | HR 85 | Temp 98.9°F | Resp 18 | Ht 70.0 in | Wt 180.7 lb

## 2019-02-16 VITALS — BP 164/84

## 2019-02-16 DIAGNOSIS — C155 Malignant neoplasm of lower third of esophagus: Secondary | ICD-10-CM | POA: Diagnosis not present

## 2019-02-16 DIAGNOSIS — J90 Pleural effusion, not elsewhere classified: Secondary | ICD-10-CM | POA: Diagnosis not present

## 2019-02-16 DIAGNOSIS — N189 Chronic kidney disease, unspecified: Secondary | ICD-10-CM

## 2019-02-16 DIAGNOSIS — E1122 Type 2 diabetes mellitus with diabetic chronic kidney disease: Secondary | ICD-10-CM

## 2019-02-16 DIAGNOSIS — C160 Malignant neoplasm of cardia: Secondary | ICD-10-CM | POA: Diagnosis not present

## 2019-02-16 DIAGNOSIS — Z5111 Encounter for antineoplastic chemotherapy: Secondary | ICD-10-CM | POA: Diagnosis not present

## 2019-02-16 DIAGNOSIS — R911 Solitary pulmonary nodule: Secondary | ICD-10-CM

## 2019-02-16 DIAGNOSIS — Z95828 Presence of other vascular implants and grafts: Secondary | ICD-10-CM

## 2019-02-16 DIAGNOSIS — I129 Hypertensive chronic kidney disease with stage 1 through stage 4 chronic kidney disease, or unspecified chronic kidney disease: Secondary | ICD-10-CM

## 2019-02-16 DIAGNOSIS — M109 Gout, unspecified: Secondary | ICD-10-CM

## 2019-02-16 LAB — CBC WITH DIFFERENTIAL (CANCER CENTER ONLY)
Abs Immature Granulocytes: 0.01 10*3/uL (ref 0.00–0.07)
Basophils Absolute: 0 10*3/uL (ref 0.0–0.1)
Basophils Relative: 0 %
Eosinophils Absolute: 0.1 10*3/uL (ref 0.0–0.5)
Eosinophils Relative: 2 %
HCT: 35.2 % — ABNORMAL LOW (ref 39.0–52.0)
Hemoglobin: 11.4 g/dL — ABNORMAL LOW (ref 13.0–17.0)
Immature Granulocytes: 0 %
Lymphocytes Relative: 15 %
Lymphs Abs: 0.8 10*3/uL (ref 0.7–4.0)
MCH: 29.2 pg (ref 26.0–34.0)
MCHC: 32.4 g/dL (ref 30.0–36.0)
MCV: 90 fL (ref 80.0–100.0)
Monocytes Absolute: 0.9 10*3/uL (ref 0.1–1.0)
Monocytes Relative: 16 %
Neutro Abs: 3.9 10*3/uL (ref 1.7–7.7)
Neutrophils Relative %: 67 %
Platelet Count: 192 10*3/uL (ref 150–400)
RBC: 3.91 MIL/uL — ABNORMAL LOW (ref 4.22–5.81)
RDW: 15.9 % — ABNORMAL HIGH (ref 11.5–15.5)
WBC Count: 5.8 10*3/uL (ref 4.0–10.5)
nRBC: 0 % (ref 0.0–0.2)

## 2019-02-16 LAB — CMP (CANCER CENTER ONLY)
ALT: 48 U/L — ABNORMAL HIGH (ref 0–44)
AST: 39 U/L (ref 15–41)
Albumin: 3.7 g/dL (ref 3.5–5.0)
Alkaline Phosphatase: 142 U/L — ABNORMAL HIGH (ref 38–126)
Anion gap: 11 (ref 5–15)
BUN: 31 mg/dL — ABNORMAL HIGH (ref 8–23)
CO2: 27 mmol/L (ref 22–32)
Calcium: 9.5 mg/dL (ref 8.9–10.3)
Chloride: 103 mmol/L (ref 98–111)
Creatinine: 1.52 mg/dL — ABNORMAL HIGH (ref 0.61–1.24)
GFR, Est AFR Am: 53 mL/min — ABNORMAL LOW (ref >60)
GFR, Estimated: 46 mL/min — ABNORMAL LOW (ref >60)
Glucose, Bld: 163 mg/dL — ABNORMAL HIGH (ref 70–99)
Potassium: 3.7 mmol/L (ref 3.5–5.1)
Sodium: 141 mmol/L (ref 135–145)
Total Bilirubin: 0.4 mg/dL (ref 0.3–1.2)
Total Protein: 7.5 g/dL (ref 6.5–8.1)

## 2019-02-16 MED ORDER — LEUCOVORIN CALCIUM INJECTION 350 MG
400.0000 mg/m2 | Freq: Once | INTRAVENOUS | Status: AC
  Administered 2019-02-16: 14:00:00 812 mg via INTRAVENOUS
  Filled 2019-02-16: qty 40.6

## 2019-02-16 MED ORDER — DEXAMETHASONE SODIUM PHOSPHATE 10 MG/ML IJ SOLN
INTRAMUSCULAR | Status: AC
  Filled 2019-02-16: qty 1

## 2019-02-16 MED ORDER — PALONOSETRON HCL INJECTION 0.25 MG/5ML
INTRAVENOUS | Status: AC
  Filled 2019-02-16: qty 5

## 2019-02-16 MED ORDER — SODIUM CHLORIDE 0.9% FLUSH
10.0000 mL | INTRAVENOUS | Status: DC | PRN
  Administered 2019-02-16: 12:00:00 10 mL
  Filled 2019-02-16: qty 10

## 2019-02-16 MED ORDER — OXALIPLATIN CHEMO INJECTION 100 MG/20ML
85.0000 mg/m2 | Freq: Once | INTRAVENOUS | Status: AC
  Administered 2019-02-16: 14:00:00 175 mg via INTRAVENOUS
  Filled 2019-02-16: qty 35

## 2019-02-16 MED ORDER — SODIUM CHLORIDE 0.9 % IV SOLN
2475.0000 mg/m2 | INTRAVENOUS | Status: DC
  Administered 2019-02-16: 16:00:00 5000 mg via INTRAVENOUS
  Filled 2019-02-16: qty 100

## 2019-02-16 MED ORDER — DEXTROSE 5 % IV SOLN
Freq: Once | INTRAVENOUS | Status: AC
  Administered 2019-02-16: 13:00:00 via INTRAVENOUS
  Filled 2019-02-16: qty 250

## 2019-02-16 MED ORDER — DEXAMETHASONE SODIUM PHOSPHATE 10 MG/ML IJ SOLN
10.0000 mg | Freq: Once | INTRAMUSCULAR | Status: AC
  Administered 2019-02-16: 10 mg via INTRAVENOUS

## 2019-02-16 MED ORDER — FLUOROURACIL CHEMO INJECTION 2.5 GM/50ML
400.0000 mg/m2 | Freq: Once | INTRAVENOUS | Status: AC
  Administered 2019-02-16: 800 mg via INTRAVENOUS
  Filled 2019-02-16: qty 16

## 2019-02-16 MED ORDER — PALONOSETRON HCL INJECTION 0.25 MG/5ML
0.2500 mg | Freq: Once | INTRAVENOUS | Status: AC
  Administered 2019-02-16: 0.25 mg via INTRAVENOUS

## 2019-02-16 NOTE — Telephone Encounter (Signed)
TC per Lattie Haw to [facility] [phone] for pt to get infusion pump taken off at home on Saturday. They faxed over the referral paper. I filled it out and had Lattie Haw to sign it and faxed it back to fax# [phone]. Fax receipt came back complete 14:13.

## 2019-02-16 NOTE — Progress Notes (Signed)
[facility] OFFICE PROGRESS NOTE   Diagnosis: Gastroesophageal cancer  INTERVAL HISTORY:   Lance Herring returns as scheduled.  He completed cycle 2 FOLFOX 02/02/2019.  He denies nausea/vomiting.  No mouth sores.  No diarrhea.  Cold sensitivity lasted 1 or 2 days.  No persistent neuropathy symptoms.  He is tolerating liquids.  He continues tube feedings.  He denies pain.  He denies fever, cough, shortness of breath.  Objective:  Vital signs in last 24 hours:  Blood pressure (!) 162/89, pulse 85, temperature 98.9 F (37.2 C), temperature source Oral, resp. rate 18, height 5\' 10"  (1.778 m), weight 180 lb 11.2 oz (82 kg), SpO2 100 %.    HEENT: No thrush or ulcers. Resp: Respirations even, unlabored. GI: Feeding tube site without evidence of infection. Vascular: Right lower leg is slightly larger than the left lower leg.  Skin: Palms without erythema. Port-A-Cath without erythema.   Lab Results:  Lab Results  Component Value Date   WBC 5.8 02/16/2019   HGB 11.4 (L) 02/16/2019   HCT 35.2 (L) 02/16/2019   MCV 90.0 02/16/2019   PLT 192 02/16/2019   NEUTROABS 3.9 02/16/2019    Imaging:  No results found.  Medications: I have reviewed the patient's current medications.  Assessment/Plan: 1. 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 Partially obstructing esophagus mass 02/01/2017, biopsy confirmed moderately differentiated adenocarcinoma ? Staging CTs 03/06/2017-esophagus mass, single noncalcified pulmonary nodule the medial aspect of the left lower lobe, evidence of prior granulomatous disease, right adrenal adenoma ? PET scan 04/07/2017-hypermetabolic distal esophageal mass, no evidence of metastatic disease in the abdomen, pelvis, or skeleton. The right adrenal mass on CT was not hypermetabolic and favored to be an adenoma ? Radiation and concurrent weekly Taxol/carboplatin 04/20/2017- 05/28/2017 ? CTs 07/05/2018-improvement in  esophagus mass ? Endoscopy 07/26/2017-ulceration and friability from the GE junction up to 35 cm, 2 cm mass at the GE junction-improved in size, biopsy revealed no malignancy ? PET scan 84/66/5993- hypermetabolic activity at the distal esophagus and GE junction, no evidence of distant metastatic disease ? Admission to [facility] 11/18/2018 with a syncope event secondary to upper GI bleeding ? Upper endoscopy 11/18/2018- obstructing mass at 40 cm, biopsies revealed inflammation and no malignancy ? CTs 11/20/2018- 10 mm nodule at the left lung apex, thickening at the distal esophagus, evidence of prior granulomatous disease with calcified right lung nodules and right paratracheal/hilar lymph nodes ? Upper endoscopy 12/07/2026- pinpoint stricture in the distal esophagus, lumen was 1 mm.  Gastroesophageal mucosa with exudate consistent with ulcer.  No malignancy.   ? PET scan 12/19/2018- focal area of increased uptake at or just below the level of the GE junction; moderate FDG uptake associated with left apical lung nodule.  Bilateral pleural effusions and lower lobe atelectasis. ? Endoscopy via gastrostomy tube tract 01/05/2019- mass at the gastric cardia/GE junction, biopsy confirmed adenocarcinoma, CPS-5 ? Cycle 1 FOLFOX 01/18/2019 ? Cycle 2 FOLFOX 02/02/2019 ? Cycle 3 FOLFOX 02/16/2019 2. Upper GI bleeding secondary to #1, 11/18/2018-transfused with a total of 4 units of packed red blood cells 3. Nutrition- placement of gastrostomy feeding tube 11/22/2018  Feeding tube placed initial treatment in 2018, removed May 2019  4.Diabetes 5.Hypertension 6.Gout  7.Chronic renal failure   Disposition: Lance Herring appears stable.  He has completed 2 cycles of FOLFOX.  He is tolerating the chemotherapy well.  Plan to proceed with cycle 3 today as scheduled.  We reviewed the CBC from  today.  Counts are adequate for treatment.  He will return for lab, follow-up and cycle 4 FOLFOX in 2 weeks.   He will contact the office in the interim with any problems.    Ned Card ANP/GNP-BC   02/16/2019  12:34 PM

## 2019-02-16 NOTE — Patient Instructions (Signed)
Coronavirus (COVID-19) Are you at risk?  Are you at risk for the Coronavirus (COVID-19)?  To be considered HIGH RISK for Coronavirus (COVID-19), you have to meet the following criteria:  . Traveled to [place], [place], [place], [place] or [place]; or in the [place] to [place], [place], [place], or [place]; and have fever, cough, and shortness of breath within the last 2 weeks of travel OR . Been in close contact with a person diagnosed with COVID-19 within the last 2 weeks and have fever, cough, and shortness of breath . IF YOU DO NOT MEET THESE CRITERIA, YOU ARE CONSIDERED LOW RISK FOR COVID-19.  What to do if you are HIGH RISK for COVID-19?  . If you are having a medical emergency, call 911. . Seek medical care right away. Before you go to a doctor's office, urgent care or emergency department, call ahead and tell them about your recent travel, contact with someone diagnosed with COVID-19, and your symptoms. You should receive instructions from your physician's office regarding next steps of care.  . When you arrive at healthcare provider, tell the healthcare staff immediately you have returned from visiting [place], [place], [place], [place] or [place]; or traveled in the [place] to [place], [place], [place], or [place]; in the last two weeks or you have been in close contact with a person diagnosed with COVID-19 in the last 2 weeks.   . Tell the health care staff about your symptoms: fever, cough and shortness of breath. . After you have been seen by a medical provider, you will be either: o Tested for (COVID-19) and discharged home on quarantine except to seek medical care if symptoms worsen, and asked to  - Stay home and avoid contact with others until you get your results (4-5 days)  - Avoid travel on public transportation if possible (such as bus, train, or airplane) or o Sent to the Emergency Department by EMS for evaluation, COVID-19 testing, and possible  admission depending on your condition and test results.  What to do if you are LOW RISK for COVID-19?  Reduce your risk of any infection by using the same precautions used for avoiding the common cold or flu:  . Wash your hands often with soap and warm water for at least 20 seconds.  If soap and water are not readily available, use an alcohol-based hand sanitizer with at least 60% alcohol.  . If coughing or sneezing, cover your mouth and nose by coughing or sneezing into the elbow areas of your shirt or coat, into a tissue or into your sleeve (not your hands). . Avoid shaking hands with others and consider head nods or verbal greetings only. . Avoid touching your eyes, nose, or mouth with unwashed hands.  . Avoid close contact with people who are sick. . Avoid places or events with large numbers of people in one location, like concerts or sporting events. . Carefully consider travel plans you have or are making. . If you are planning any travel outside or inside the [place], visit the CDC's Travelers' Health webpage for the latest health notices. . If you have some symptoms but not all symptoms, continue to monitor at home and seek medical attention if your symptoms worsen. . If you are having a medical emergency, call 911.   ADDITIONAL HEALTHCARE OPTIONS FOR PATIENTS  [facility] / e-Visit: [email or web address]         [facility]: [phone]  Onset   Urgent Care: [facility]: [facility] Discharge Instructions for Patients Receiving Chemotherapy  Today you received the following chemotherapy agents Oxaliplatin, Leucovorin, 5FU  To help prevent nausea and vomiting after your treatment, we encourage you to take your nausea medication as directed.    If you develop nausea and vomiting that is not controlled by your nausea medication, call the  clinic.   BELOW ARE SYMPTOMS THAT SHOULD BE REPORTED IMMEDIATELY:  *FEVER GREATER THAN 100.5 F  *CHILLS WITH OR WITHOUT FEVER  NAUSEA AND VOMITING THAT IS NOT CONTROLLED WITH YOUR NAUSEA MEDICATION  *UNUSUAL SHORTNESS OF BREATH  *UNUSUAL BRUISING OR BLEEDING  TENDERNESS IN MOUTH AND THROAT WITH OR WITHOUT PRESENCE OF ULCERS  *URINARY PROBLEMS  *BOWEL PROBLEMS  UNUSUAL RASH Items with * indicate a potential emergency and should be followed up as soon as possible.  Feel free to call the clinic should you have any questions or concerns. The clinic phone number is [phone].  Please show the [place] at check-in to the Emergency Department and triage nurse.

## 2019-02-16 NOTE — Telephone Encounter (Signed)
Called pathology at [facility] [phone]) per Lattie Haw and requested HER-2 neu testing on biopsy from 01/05/2019. Request done.

## 2019-02-16 NOTE — Progress Notes (Signed)
Per Ned Card, NP / Dr Benay Spice OK to proceed with CRT 1.52

## 2019-02-17 ENCOUNTER — Telehealth: Payer: Self-pay | Admitting: *Deleted

## 2019-02-17 ENCOUNTER — Telehealth: Payer: Self-pay | Admitting: Nurse Practitioner

## 2019-02-17 NOTE — Telephone Encounter (Signed)
Confirmed with supervisor, Missy that they will be able to serve patient starting tomorrow. Confirmed they have all the documentation needed. They will see him tomorrow at 2pm. Patient notified as well.

## 2019-02-17 NOTE — Telephone Encounter (Signed)
Scheduled appt per 4/23 los. °

## 2019-02-18 ENCOUNTER — Inpatient Hospital Stay: Payer: Medicare Other

## 2019-02-26 ENCOUNTER — Other Ambulatory Visit: Payer: Self-pay | Admitting: Oncology

## 2019-03-01 ENCOUNTER — Other Ambulatory Visit: Payer: Self-pay | Admitting: *Deleted

## 2019-03-01 DIAGNOSIS — C155 Malignant neoplasm of lower third of esophagus: Secondary | ICD-10-CM

## 2019-03-02 ENCOUNTER — Inpatient Hospital Stay: Payer: Medicare Other

## 2019-03-02 ENCOUNTER — Inpatient Hospital Stay: Payer: Medicare Other | Attending: Oncology

## 2019-03-02 ENCOUNTER — Other Ambulatory Visit: Payer: Self-pay

## 2019-03-02 ENCOUNTER — Encounter: Payer: Self-pay | Admitting: Nurse Practitioner

## 2019-03-02 ENCOUNTER — Inpatient Hospital Stay (HOSPITAL_BASED_OUTPATIENT_CLINIC_OR_DEPARTMENT_OTHER): Payer: Medicare Other | Admitting: Nurse Practitioner

## 2019-03-02 ENCOUNTER — Telehealth: Payer: Self-pay | Admitting: Nurse Practitioner

## 2019-03-02 VITALS — BP 160/86 | HR 68 | Temp 98.1°F | Resp 18 | Ht 70.0 in | Wt 180.9 lb

## 2019-03-02 DIAGNOSIS — C160 Malignant neoplasm of cardia: Secondary | ICD-10-CM

## 2019-03-02 DIAGNOSIS — C155 Malignant neoplasm of lower third of esophagus: Secondary | ICD-10-CM

## 2019-03-02 DIAGNOSIS — R911 Solitary pulmonary nodule: Secondary | ICD-10-CM | POA: Diagnosis not present

## 2019-03-02 DIAGNOSIS — N189 Chronic kidney disease, unspecified: Secondary | ICD-10-CM

## 2019-03-02 DIAGNOSIS — Z5111 Encounter for antineoplastic chemotherapy: Secondary | ICD-10-CM | POA: Insufficient documentation

## 2019-03-02 DIAGNOSIS — E1122 Type 2 diabetes mellitus with diabetic chronic kidney disease: Secondary | ICD-10-CM

## 2019-03-02 DIAGNOSIS — J90 Pleural effusion, not elsewhere classified: Secondary | ICD-10-CM | POA: Diagnosis not present

## 2019-03-02 DIAGNOSIS — Z95828 Presence of other vascular implants and grafts: Secondary | ICD-10-CM

## 2019-03-02 DIAGNOSIS — I129 Hypertensive chronic kidney disease with stage 1 through stage 4 chronic kidney disease, or unspecified chronic kidney disease: Secondary | ICD-10-CM

## 2019-03-02 LAB — CMP (CANCER CENTER ONLY)
ALT: 95 U/L — ABNORMAL HIGH (ref 0–44)
AST: 69 U/L — ABNORMAL HIGH (ref 15–41)
Albumin: 3.6 g/dL (ref 3.5–5.0)
Alkaline Phosphatase: 146 U/L — ABNORMAL HIGH (ref 38–126)
Anion gap: 8 (ref 5–15)
BUN: 27 mg/dL — ABNORMAL HIGH (ref 8–23)
CO2: 29 mmol/L (ref 22–32)
Calcium: 9.4 mg/dL (ref 8.9–10.3)
Chloride: 100 mmol/L (ref 98–111)
Creatinine: 1.52 mg/dL — ABNORMAL HIGH (ref 0.61–1.24)
GFR, Est AFR Am: 53 mL/min — ABNORMAL LOW (ref >60)
GFR, Estimated: 46 mL/min — ABNORMAL LOW (ref >60)
Glucose, Bld: 168 mg/dL — ABNORMAL HIGH (ref 70–99)
Potassium: 3.7 mmol/L (ref 3.5–5.1)
Sodium: 137 mmol/L (ref 135–145)
Total Bilirubin: 0.4 mg/dL (ref 0.3–1.2)
Total Protein: 7.3 g/dL (ref 6.5–8.1)

## 2019-03-02 LAB — CBC WITH DIFFERENTIAL (CANCER CENTER ONLY)
Abs Immature Granulocytes: 0.01 10*3/uL (ref 0.00–0.07)
Basophils Absolute: 0 10*3/uL (ref 0.0–0.1)
Basophils Relative: 0 %
Eosinophils Absolute: 0.1 10*3/uL (ref 0.0–0.5)
Eosinophils Relative: 3 %
HCT: 35.1 % — ABNORMAL LOW (ref 39.0–52.0)
Hemoglobin: 11.4 g/dL — ABNORMAL LOW (ref 13.0–17.0)
Immature Granulocytes: 0 %
Lymphocytes Relative: 19 %
Lymphs Abs: 0.9 10*3/uL (ref 0.7–4.0)
MCH: 29.2 pg (ref 26.0–34.0)
MCHC: 32.5 g/dL (ref 30.0–36.0)
MCV: 90 fL (ref 80.0–100.0)
Monocytes Absolute: 1 10*3/uL (ref 0.1–1.0)
Monocytes Relative: 22 %
Neutro Abs: 2.6 10*3/uL (ref 1.7–7.7)
Neutrophils Relative %: 56 %
Platelet Count: 194 10*3/uL (ref 150–400)
RBC: 3.9 MIL/uL — ABNORMAL LOW (ref 4.22–5.81)
RDW: 16 % — ABNORMAL HIGH (ref 11.5–15.5)
WBC Count: 4.6 10*3/uL (ref 4.0–10.5)
nRBC: 0 % (ref 0.0–0.2)

## 2019-03-02 MED ORDER — DEXTROSE 5 % IV SOLN
Freq: Once | INTRAVENOUS | Status: AC
  Administered 2019-03-02: 13:00:00 via INTRAVENOUS
  Filled 2019-03-02: qty 250

## 2019-03-02 MED ORDER — SODIUM CHLORIDE 0.9% FLUSH
10.0000 mL | INTRAVENOUS | Status: DC | PRN
  Administered 2019-03-02: 10 mL
  Filled 2019-03-02: qty 10

## 2019-03-02 MED ORDER — DEXAMETHASONE SODIUM PHOSPHATE 10 MG/ML IJ SOLN
INTRAMUSCULAR | Status: AC
  Filled 2019-03-02: qty 1

## 2019-03-02 MED ORDER — PALONOSETRON HCL INJECTION 0.25 MG/5ML
0.2500 mg | Freq: Once | INTRAVENOUS | Status: AC
  Administered 2019-03-02: 13:00:00 0.25 mg via INTRAVENOUS

## 2019-03-02 MED ORDER — LEUCOVORIN CALCIUM INJECTION 350 MG
400.0000 mg/m2 | Freq: Once | INTRAVENOUS | Status: AC
  Administered 2019-03-02: 14:00:00 812 mg via INTRAVENOUS
  Filled 2019-03-02: qty 40.6

## 2019-03-02 MED ORDER — OXALIPLATIN CHEMO INJECTION 100 MG/20ML
85.0000 mg/m2 | Freq: Once | INTRAVENOUS | Status: AC
  Administered 2019-03-02: 14:00:00 175 mg via INTRAVENOUS
  Filled 2019-03-02: qty 35

## 2019-03-02 MED ORDER — FLUOROURACIL CHEMO INJECTION 2.5 GM/50ML
400.0000 mg/m2 | Freq: Once | INTRAVENOUS | Status: AC
  Administered 2019-03-02: 16:00:00 800 mg via INTRAVENOUS
  Filled 2019-03-02: qty 16

## 2019-03-02 MED ORDER — DEXAMETHASONE SODIUM PHOSPHATE 10 MG/ML IJ SOLN
10.0000 mg | Freq: Once | INTRAMUSCULAR | Status: AC
  Administered 2019-03-02: 13:00:00 10 mg via INTRAVENOUS

## 2019-03-02 MED ORDER — SODIUM CHLORIDE 0.9 % IV SOLN
2475.0000 mg/m2 | INTRAVENOUS | Status: DC
  Administered 2019-03-02: 17:00:00 5000 mg via INTRAVENOUS
  Filled 2019-03-02: qty 100

## 2019-03-02 MED ORDER — PALONOSETRON HCL INJECTION 0.25 MG/5ML
INTRAVENOUS | Status: AC
  Filled 2019-03-02: qty 5

## 2019-03-02 NOTE — Telephone Encounter (Signed)
Scheduled appt per 5/7 los. °

## 2019-03-02 NOTE — Patient Instructions (Signed)

## 2019-03-02 NOTE — Progress Notes (Addendum)
[facility] OFFICE PROGRESS NOTE   Diagnosis: Gastroesophageal cancer  INTERVAL HISTORY:   Lance Herring returns as scheduled.  He completed cycle 3 FOLFOX 02/16/2019.  He denies nausea/vomiting.  No mouth sores.  No diarrhea.  Cold sensitivity lasted 1 or 2 days.  No numbness or tingling in the hands or feet.  He is tolerating liquids.  He continues tube feedings.  Objective:  Vital signs in last 24 hours:  Blood pressure (!) 160/86, pulse 68, temperature 98.1 F (36.7 C), temperature source Oral, resp. rate 18, height 5\' 10"  (1.778 m), weight 180 lb 14.4 oz (82.1 kg), SpO2 100 %.    GI: Feeding tube site is without evidence of infection. Vascular: No leg edema. Neuro: Vibratory sense intact over the fingertips per tuning fork exam. Skin: Palms without erythema. Port-A-Cath without erythema.   Lab Results:  Lab Results  Component Value Date   WBC 5.8 02/16/2019   HGB 11.4 (L) 02/16/2019   HCT 35.2 (L) 02/16/2019   MCV 90.0 02/16/2019   PLT 192 02/16/2019   NEUTROABS 3.9 02/16/2019    Imaging:  No results found.  Medications: I have reviewed the patient's current medications.  Assessment/Plan: 1. 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 Partially obstructing esophagus mass 02/01/2017, biopsy confirmed moderately differentiated adenocarcinoma ? Staging CTs 03/06/2017-esophagus mass, single noncalcified pulmonary nodule the medial aspect of the left lower lobe, evidence of prior granulomatous disease, right adrenal adenoma ? PET scan 04/07/2017-hypermetabolic distal esophageal mass, no evidence of metastatic disease in the abdomen, pelvis, or skeleton. The right adrenal mass on CT was not hypermetabolic and favored to be an adenoma ? Radiation and concurrent weekly Taxol/carboplatin 04/20/2017-05/28/2017 ? CTs 07/05/2018-improvement in esophagus mass ? Endoscopy 07/26/2017-ulceration and friability from the GE junction up to 35  cm, 2 cm mass at the GE junction-improved in size, biopsy revealed no malignancy ? PET scan 08/19/2017-hypermetabolic activity at the distal esophagus and GE junction, no evidence of distant metastatic disease ? Admission to [facility] 11/18/2018 with a syncope event secondary to upper GI bleeding ? Upper endoscopy 11/18/2018-obstructing mass at 40 cm, biopsies revealed inflammation and no malignancy ? CTs 11/20/2018-10 mm nodule at the left lung apex, thickening at the distal esophagus, evidence of prior granulomatous disease with calcified right lung nodules and right paratracheal/hilar lymph nodes ? Upper endoscopy 12/07/2026- pinpoint stricture in the distal esophagus, lumen was 1 mm. Gastroesophageal mucosa with exudate consistent with ulcer. No malignancy.  ? PET scan 12/19/2018- focal area of increased uptake at or just below the level of the GE junction; moderate FDG uptake associated with left apical lung nodule. Bilateral pleural effusions and lower lobe atelectasis. ? Endoscopy via gastrostomy tube tract 01/05/2019- mass at the gastric cardia/GE junction, biopsy confirmed adenocarcinoma, CPS-5 ? Cycle 1 FOLFOX 01/18/2019 ? Cycle 2 FOLFOX 02/02/2019 ? Cycle 3 FOLFOX 02/16/2019 ? Cycle 4 FOLFOX 03/02/2019 2. Upper GI bleeding secondary to #1, 11/18/2018-transfused with a total of 4 units of packed red blood cells 3. Nutrition-placement of gastrostomy feeding tube 11/22/2018  Feeding tube placed initial treatment in 2018, removed May 2019  4.Diabetes 5.Hypertension 6.Gout  7.Chronic renal failure  Disposition: Lance Herring appears stable.  He has completed 3 cycles of FOLFOX.  He continues to tolerate chemotherapy well.  Plan to proceed with cycle 4 today as scheduled.  We reviewed the CBC from today.  Counts are adequate for treatment.  Transaminases mildly elevated.  This is likely related to chemotherapy.  We will continue to monitor.  He will return for lab,  follow-up and cycle 5 FOLFOX in 2 weeks.  He will contact the office in the interim with any problems.  Patient seen with Dr. Benay Spice.     Ned Card ANP/GNP-BC   03/02/2019  11:54 AM This was a shared visit with Ned Card.  Lance Herring is tolerating the FOLFOX well.  He has experienced some improvement in dysphagia.  The plan is to schedule a restaging chest CT after cycle 5.  Julieanne Manson, MD

## 2019-03-02 NOTE — Patient Instructions (Signed)
Coronavirus (COVID-19) Are you at risk?  Are you at risk for the Coronavirus (COVID-19)?  To be considered HIGH RISK for Coronavirus (COVID-19), you have to meet the following criteria:  . Traveled to [place], [place], [place], [place] or [place]; or in the [place] to [place], [place], [place], or [place]; and have fever, cough, and shortness of breath within the last 2 weeks of travel OR . Been in close contact with a person diagnosed with COVID-19 within the last 2 weeks and have fever, cough, and shortness of breath . IF YOU DO NOT MEET THESE CRITERIA, YOU ARE CONSIDERED LOW RISK FOR COVID-19.  What to do if you are HIGH RISK for COVID-19?  . If you are having a medical emergency, call 911. . Seek medical care right away. Before you go to a doctor's office, urgent care or emergency department, call ahead and tell them about your recent travel, contact with someone diagnosed with COVID-19, and your symptoms. You should receive instructions from your physician's office regarding next steps of care.  . When you arrive at healthcare provider, tell the healthcare staff immediately you have returned from visiting [place], [place], [place], [place] or [place]; or traveled in the [place] to [place], [place], [place], or [place]; in the last two weeks or you have been in close contact with a person diagnosed with COVID-19 in the last 2 weeks.   . Tell the health care staff about your symptoms: fever, cough and shortness of breath. . After you have been seen by a medical provider, you will be either: o Tested for (COVID-19) and discharged home on quarantine except to seek medical care if symptoms worsen, and asked to  - Stay home and avoid contact with others until you get your results (4-5 days)  - Avoid travel on public transportation if possible (such as bus, train, or airplane) or o Sent to the Emergency Department by EMS for evaluation, COVID-19 testing, and possible  admission depending on your condition and test results.  What to do if you are LOW RISK for COVID-19?  Reduce your risk of any infection by using the same precautions used for avoiding the common cold or flu:  . Wash your hands often with soap and warm water for at least 20 seconds.  If soap and water are not readily available, use an alcohol-based hand sanitizer with at least 60% alcohol.  . If coughing or sneezing, cover your mouth and nose by coughing or sneezing into the elbow areas of your shirt or coat, into a tissue or into your sleeve (not your hands). . Avoid shaking hands with others and consider head nods or verbal greetings only. . Avoid touching your eyes, nose, or mouth with unwashed hands.  . Avoid close contact with people who are sick. . Avoid places or events with large numbers of people in one location, like concerts or sporting events. . Carefully consider travel plans you have or are making. . If you are planning any travel outside or inside the [place], visit the CDC's Travelers' Health webpage for the latest health notices. . If you have some symptoms but not all symptoms, continue to monitor at home and seek medical attention if your symptoms worsen. . If you are having a medical emergency, call 911.   ADDITIONAL HEALTHCARE OPTIONS FOR PATIENTS  [facility] / e-Visit: [email or web address]         [facility]: [phone]  Onset   Urgent Care: [facility]: [place] Discharge Instructions for Patients Receiving Chemotherapy  Today you received the following chemotherapy agents: Oxaliplatin, Leucovorin and 5FU.  To help prevent nausea and vomiting after your treatment, we encourage you to take your nausea medication as directed.    If you develop nausea and vomiting that is not controlled by your nausea medication, call  the clinic.   BELOW ARE SYMPTOMS THAT SHOULD BE REPORTED IMMEDIATELY:  *FEVER GREATER THAN 100.5 F  *CHILLS WITH OR WITHOUT FEVER  NAUSEA AND VOMITING THAT IS NOT CONTROLLED WITH YOUR NAUSEA MEDICATION  *UNUSUAL SHORTNESS OF BREATH  *UNUSUAL BRUISING OR BLEEDING  TENDERNESS IN MOUTH AND THROAT WITH OR WITHOUT PRESENCE OF ULCERS  *URINARY PROBLEMS  *BOWEL PROBLEMS  UNUSUAL RASH Items with * indicate a potential emergency and should be followed up as soon as possible.  Feel free to call the clinic should you have any questions or concerns. The clinic phone number is [phone].  Please show the [facility] at check-in to the Emergency Department and triage nurse.

## 2019-03-02 NOTE — Progress Notes (Signed)
Per Lattie Haw I let infusion nurse know to send pt home with flush supplies so that home health can deaccess his port.

## 2019-03-04 ENCOUNTER — Inpatient Hospital Stay: Payer: Medicare Other

## 2019-03-12 ENCOUNTER — Other Ambulatory Visit: Payer: Self-pay | Admitting: Oncology

## 2019-03-14 ENCOUNTER — Inpatient Hospital Stay: Payer: Medicare Other

## 2019-03-14 ENCOUNTER — Telehealth: Payer: Self-pay

## 2019-03-14 NOTE — Progress Notes (Signed)
Nutrition  Called patient for nutrition follow-up.  Left message on home voicemail to return RD's call  Neaveh Belanger B. Zenia Resides, [place], [place] Registered Dietitian [phone] (pager)

## 2019-03-14 NOTE — Telephone Encounter (Signed)
Nutrition Follow-up:  RD working remotely.  Patient with gastric mass/esophageal cancer.  Followed by Dr. Benay Spice.    Wife returned RD's call.  Spoke with patient and reports that he continues with giving 2 cartons of Jevity 1.2, 4 times per day (8 bottles daily).  Reports that he gives 4-8 oz at each feeding sometimes more.  Reports that he is tolerating tube feeding well. Report some issues with hard stool, typically has bowel movement daily. Reports that home health nurse had recommended he start senokot which has done. Denies issues with nausea.  Reports that he has adequate supplies of tube feeding from [facility].      Medications: amaryl, actos, phenergan  Labs: glucose 168, BUN 27, creatinine 1.52  Anthropometrics:   Weight stable at 180 lb 14.4 oz noted on 5/7, same weight since March 2020   Estimated Energy Needs  Kcals: 2000-2200  Protein: 95-110 g Fluid: > 2 L  NUTRITION DIAGNOSIS: Inadequate oral intake continues as relies on tube feeding    INTERVENTION:  Patient to continue jevity 1.2, 2 bottles QID with current water flush of at least 8 oz at each feeding.  This will provide 2280 calories and 105 g protein and 2448ml free water (including from formula and flush).   Contact information given    MONITORING, EVALUATION, GOAL: Patient will continue to tolerate tube feeding for weight maintenance.  NEXT VISIT: phone f/u June 23rd, patient agreeable  Sarah Zerby B. Zenia Resides, [place], [place] Registered Dietitian [phone] (pager)

## 2019-03-16 ENCOUNTER — Inpatient Hospital Stay: Payer: Medicare Other

## 2019-03-16 ENCOUNTER — Telehealth: Payer: Self-pay | Admitting: Oncology

## 2019-03-16 ENCOUNTER — Inpatient Hospital Stay (HOSPITAL_BASED_OUTPATIENT_CLINIC_OR_DEPARTMENT_OTHER): Payer: Medicare Other | Admitting: Oncology

## 2019-03-16 ENCOUNTER — Other Ambulatory Visit: Payer: Self-pay

## 2019-03-16 VITALS — BP 154/88 | HR 92 | Temp 98.0°F | Resp 18 | Ht 70.0 in | Wt 177.8 lb

## 2019-03-16 DIAGNOSIS — J90 Pleural effusion, not elsewhere classified: Secondary | ICD-10-CM

## 2019-03-16 DIAGNOSIS — C155 Malignant neoplasm of lower third of esophagus: Secondary | ICD-10-CM

## 2019-03-16 DIAGNOSIS — I129 Hypertensive chronic kidney disease with stage 1 through stage 4 chronic kidney disease, or unspecified chronic kidney disease: Secondary | ICD-10-CM

## 2019-03-16 DIAGNOSIS — R911 Solitary pulmonary nodule: Secondary | ICD-10-CM

## 2019-03-16 DIAGNOSIS — R1310 Dysphagia, unspecified: Secondary | ICD-10-CM

## 2019-03-16 DIAGNOSIS — E1122 Type 2 diabetes mellitus with diabetic chronic kidney disease: Secondary | ICD-10-CM

## 2019-03-16 DIAGNOSIS — N189 Chronic kidney disease, unspecified: Secondary | ICD-10-CM

## 2019-03-16 DIAGNOSIS — Z95828 Presence of other vascular implants and grafts: Secondary | ICD-10-CM

## 2019-03-16 DIAGNOSIS — C160 Malignant neoplasm of cardia: Secondary | ICD-10-CM

## 2019-03-16 DIAGNOSIS — Z5111 Encounter for antineoplastic chemotherapy: Secondary | ICD-10-CM | POA: Diagnosis not present

## 2019-03-16 LAB — CMP (CANCER CENTER ONLY)
ALT: 47 U/L — ABNORMAL HIGH (ref 0–44)
AST: 37 U/L (ref 15–41)
Albumin: 3.4 g/dL — ABNORMAL LOW (ref 3.5–5.0)
Alkaline Phosphatase: 159 U/L — ABNORMAL HIGH (ref 38–126)
Anion gap: 10 (ref 5–15)
BUN: 26 mg/dL — ABNORMAL HIGH (ref 8–23)
CO2: 30 mmol/L (ref 22–32)
Calcium: 9.6 mg/dL (ref 8.9–10.3)
Chloride: 97 mmol/L — ABNORMAL LOW (ref 98–111)
Creatinine: 1.67 mg/dL — ABNORMAL HIGH (ref 0.61–1.24)
GFR, Est AFR Am: 47 mL/min — ABNORMAL LOW (ref >60)
GFR, Estimated: 41 mL/min — ABNORMAL LOW (ref >60)
Glucose, Bld: 216 mg/dL — ABNORMAL HIGH (ref 70–99)
Potassium: 3.4 mmol/L — ABNORMAL LOW (ref 3.5–5.1)
Sodium: 137 mmol/L (ref 135–145)
Total Bilirubin: 0.4 mg/dL (ref 0.3–1.2)
Total Protein: 7 g/dL (ref 6.5–8.1)

## 2019-03-16 LAB — CBC WITH DIFFERENTIAL (CANCER CENTER ONLY)
Abs Immature Granulocytes: 0.01 10*3/uL (ref 0.00–0.07)
Basophils Absolute: 0 10*3/uL (ref 0.0–0.1)
Basophils Relative: 0 %
Eosinophils Absolute: 0.1 10*3/uL (ref 0.0–0.5)
Eosinophils Relative: 3 %
HCT: 34.3 % — ABNORMAL LOW (ref 39.0–52.0)
Hemoglobin: 11.4 g/dL — ABNORMAL LOW (ref 13.0–17.0)
Immature Granulocytes: 0 %
Lymphocytes Relative: 17 %
Lymphs Abs: 0.8 10*3/uL (ref 0.7–4.0)
MCH: 29.5 pg (ref 26.0–34.0)
MCHC: 33.2 g/dL (ref 30.0–36.0)
MCV: 88.6 fL (ref 80.0–100.0)
Monocytes Absolute: 1.2 10*3/uL — ABNORMAL HIGH (ref 0.1–1.0)
Monocytes Relative: 25 %
Neutro Abs: 2.6 10*3/uL (ref 1.7–7.7)
Neutrophils Relative %: 55 %
Platelet Count: 226 10*3/uL (ref 150–400)
RBC: 3.87 MIL/uL — ABNORMAL LOW (ref 4.22–5.81)
RDW: 16.3 % — ABNORMAL HIGH (ref 11.5–15.5)
WBC Count: 4.8 10*3/uL (ref 4.0–10.5)
nRBC: 0 % (ref 0.0–0.2)

## 2019-03-16 MED ORDER — DEXAMETHASONE SODIUM PHOSPHATE 10 MG/ML IJ SOLN
10.0000 mg | Freq: Once | INTRAMUSCULAR | Status: AC
  Administered 2019-03-16: 12:00:00 10 mg via INTRAVENOUS

## 2019-03-16 MED ORDER — OXALIPLATIN CHEMO INJECTION 100 MG/20ML
85.0000 mg/m2 | Freq: Once | INTRAVENOUS | Status: AC
  Administered 2019-03-16: 175 mg via INTRAVENOUS
  Filled 2019-03-16: qty 35

## 2019-03-16 MED ORDER — DEXAMETHASONE SODIUM PHOSPHATE 10 MG/ML IJ SOLN
INTRAMUSCULAR | Status: AC
  Filled 2019-03-16: qty 1

## 2019-03-16 MED ORDER — DEXTROSE 5 % IV SOLN
Freq: Once | INTRAVENOUS | Status: AC
  Administered 2019-03-16: 12:00:00 via INTRAVENOUS
  Filled 2019-03-16: qty 250

## 2019-03-16 MED ORDER — PALONOSETRON HCL INJECTION 0.25 MG/5ML
INTRAVENOUS | Status: AC
  Filled 2019-03-16: qty 5

## 2019-03-16 MED ORDER — SODIUM CHLORIDE 0.9 % IV SOLN
2475.0000 mg/m2 | INTRAVENOUS | Status: DC
  Administered 2019-03-16: 5000 mg via INTRAVENOUS
  Filled 2019-03-16: qty 100

## 2019-03-16 MED ORDER — SODIUM CHLORIDE 0.9% FLUSH
10.0000 mL | INTRAVENOUS | Status: DC | PRN
  Administered 2019-03-16: 10:00:00 10 mL
  Filled 2019-03-16: qty 10

## 2019-03-16 MED ORDER — PALONOSETRON HCL INJECTION 0.25 MG/5ML
0.2500 mg | Freq: Once | INTRAVENOUS | Status: AC
  Administered 2019-03-16: 0.25 mg via INTRAVENOUS

## 2019-03-16 MED ORDER — FLUOROURACIL CHEMO INJECTION 2.5 GM/50ML
400.0000 mg/m2 | Freq: Once | INTRAVENOUS | Status: AC
  Administered 2019-03-16: 800 mg via INTRAVENOUS
  Filled 2019-03-16: qty 16

## 2019-03-16 MED ORDER — LEUCOVORIN CALCIUM INJECTION 350 MG
400.0000 mg/m2 | Freq: Once | INTRAVENOUS | Status: AC
  Administered 2019-03-16: 812 mg via INTRAVENOUS
  Filled 2019-03-16: qty 40.6

## 2019-03-16 NOTE — Telephone Encounter (Signed)
Scheduled appt per 5/21 los.  Spoke with patient wife, she is aware of the appt date and time.  I also gave her the number to central radiology.

## 2019-03-16 NOTE — Progress Notes (Signed)
MD review of labs today: OK to treat with creatinine 1.67

## 2019-03-16 NOTE — Progress Notes (Signed)
[facility] OFFICE PROGRESS NOTE   Diagnosis: Esophagus cancer  INTERVAL HISTORY:   Lance Herring completed another cycle of FOLFOX on 03/02/2019.  He reports tolerating chemotherapy well.  No nausea, mouth sores, or diarrhea.  Cold sensitivity lasted a few days following chemotherapy.  No neuropathy symptoms at present.  He has noted improvement in dysphagia.  He is now tolerating some solid foods.  Objective:  Vital signs in last 24 hours:  Blood pressure (!) 154/88, pulse 92, temperature 98 F (36.7 C), temperature source Oral, resp. rate 18, height 5\' 10"  (1.778 m), weight 177 lb 12.8 oz (80.6 kg), SpO2 100 %.    Resp: Lungs clear bilaterally Cardio: Regular rate and rhythm GI: No hepatomegaly, small amount of mucoid discharge at the feeding tube site, no erythema Vascular: No leg edema  Skin: Mild hyperpigmentation of the palms  Portacath/PICC-without erythema  Lab Results:  Lab Results  Component Value Date   WBC 4.8 03/16/2019   HGB 11.4 (L) 03/16/2019   HCT 34.3 (L) 03/16/2019   MCV 88.6 03/16/2019   PLT 226 03/16/2019   NEUTROABS 2.6 03/16/2019    CMP  Lab Results  Component Value Date   NA 137 03/02/2019   K 3.7 03/02/2019   CL 100 03/02/2019   CO2 29 03/02/2019   GLUCOSE 168 (H) 03/02/2019   BUN 27 (H) 03/02/2019   CREATININE 1.52 (H) 03/02/2019   CALCIUM 9.4 03/02/2019   PROT 7.3 03/02/2019   ALBUMIN 3.6 03/02/2019   AST 69 (H) 03/02/2019   ALT 95 (H) 03/02/2019   ALKPHOS 146 (H) 03/02/2019   BILITOT 0.4 03/02/2019   GFRNONAA 46 (L) 03/02/2019   GFRAA 53 (L) 03/02/2019    Lab Results  Component Value Date   CEA1 5.10 (H) 01/17/2019     Medications: I have reviewed the patient's current medications.   Assessment/Plan: 1. 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 Partially obstructing esophagus mass 02/01/2017, biopsy confirmed moderately differentiated adenocarcinoma ? Staging CTs  03/06/2017-esophagus mass, single noncalcified pulmonary nodule the medial aspect of the left lower lobe, evidence of prior granulomatous disease, right adrenal adenoma ? PET scan 04/07/2017-hypermetabolic distal esophageal mass, no evidence of metastatic disease in the abdomen, pelvis, or skeleton. The right adrenal mass on CT was not hypermetabolic and favored to be an adenoma ? Radiation and concurrent weekly Taxol/carboplatin 04/20/2017-05/28/2017 ? CTs 07/05/2018-improvement in esophagus mass ? Endoscopy 07/26/2017-ulceration and friability from the GE junction up to 35 cm, 2 cm mass at the GE junction-improved in size, biopsy revealed no malignancy ? PET scan 08/19/2017-hypermetabolic activity at the distal esophagus and GE junction, no evidence of distant metastatic disease ? Admission to [facility] 11/18/2018 with a syncope event secondary to upper GI bleeding ? Upper endoscopy 11/18/2018-obstructing mass at 40 cm, biopsies revealed inflammation and no malignancy ? CTs 11/20/2018-10 mm nodule at the left lung apex, thickening at the distal esophagus, evidence of prior granulomatous disease with calcified right lung nodules and right paratracheal/hilar lymph nodes ? Upper endoscopy 12/07/2026- pinpoint stricture in the distal esophagus, lumen was 1 mm. Gastroesophageal mucosa with exudate consistent with ulcer. No malignancy.  ? PET scan 12/19/2018- focal area of increased uptake at or just below the level of the GE junction; moderate FDG uptake associated with left apical lung nodule. Bilateral pleural effusions and lower lobe atelectasis. ? Endoscopy via gastrostomy tube tract 01/05/2019- mass at the gastric cardia/GE junction, biopsy confirmed adenocarcinoma, CPS-5 ? Cycle 1  FOLFOX 01/18/2019 ? Cycle 2 FOLFOX 02/02/2019 ? Cycle 3 FOLFOX 02/16/2019 ? Cycle 4 FOLFOX 03/02/2019 ? Cycle 5 FOLFOX 03/16/2019 2. Upper GI bleeding secondary to #1, 11/18/2018-transfused with a total of 4 units of  packed red blood cells 3. Nutrition-placement of gastrostomy feeding tube 11/22/2018  Feeding tube placed initial treatment in 2018, removed May 2019  4.Diabetes 5.Hypertension 6.Gout  7.Chronic renal failure   Disposition: Lance Herring appears to be tolerating the FOLFOX well.  The dysphagia has improved.  He will complete cycle 5 today.  He will undergo a restaging CT evaluation when he returns for an office visit in 2 weeks.  Lance Coder, MD  03/16/2019  10:41 AM

## 2019-03-16 NOTE — Patient Instructions (Signed)
Coronavirus (COVID-19) Are you at risk?  Are you at risk for the Coronavirus (COVID-19)?  To be considered HIGH RISK for Coronavirus (COVID-19), you have to meet the following criteria:  . Traveled to [place], [place], [place], [place] or [place]; or in the [place] to [place], [place], [place], or [place]; and have fever, cough, and shortness of breath within the last 2 weeks of travel OR . Been in close contact with a person diagnosed with COVID-19 within the last 2 weeks and have fever, cough, and shortness of breath . IF YOU DO NOT MEET THESE CRITERIA, YOU ARE CONSIDERED LOW RISK FOR COVID-19.  What to do if you are HIGH RISK for COVID-19?  . If you are having a medical emergency, call 911. . Seek medical care right away. Before you go to a doctor's office, urgent care or emergency department, call ahead and tell them about your recent travel, contact with someone diagnosed with COVID-19, and your symptoms. You should receive instructions from your physician's office regarding next steps of care.  . When you arrive at healthcare provider, tell the healthcare staff immediately you have returned from visiting [place], [place], [place], [place] or [place]; or traveled in the [place] to [place], [place], [place], or [place]; in the last two weeks or you have been in close contact with a person diagnosed with COVID-19 in the last 2 weeks.   . Tell the health care staff about your symptoms: fever, cough and shortness of breath. . After you have been seen by a medical provider, you will be either: o Tested for (COVID-19) and discharged home on quarantine except to seek medical care if symptoms worsen, and asked to  - Stay home and avoid contact with others until you get your results (4-5 days)  - Avoid travel on public transportation if possible (such as bus, train, or airplane) or o Sent to the Emergency Department by EMS for evaluation, COVID-19 testing, and possible  admission depending on your condition and test results.  What to do if you are LOW RISK for COVID-19?  Reduce your risk of any infection by using the same precautions used for avoiding the common cold or flu:  . Wash your hands often with soap and warm water for at least 20 seconds.  If soap and water are not readily available, use an alcohol-based hand sanitizer with at least 60% alcohol.  . If coughing or sneezing, cover your mouth and nose by coughing or sneezing into the elbow areas of your shirt or coat, into a tissue or into your sleeve (not your hands). . Avoid shaking hands with others and consider head nods or verbal greetings only. . Avoid touching your eyes, nose, or mouth with unwashed hands.  . Avoid close contact with people who are sick. . Avoid places or events with large numbers of people in one location, like concerts or sporting events. . Carefully consider travel plans you have or are making. . If you are planning any travel outside or inside the [place], visit the CDC's Travelers' Health webpage for the latest health notices. . If you have some symptoms but not all symptoms, continue to monitor at home and seek medical attention if your symptoms worsen. . If you are having a medical emergency, call 911.   ADDITIONAL HEALTHCARE OPTIONS FOR PATIENTS  [facility] / e-Visit: [email or web address]         [facility]: [phone]  Onset   Urgent Care: [facility]: [facility] Discharge Instructions for Patients Receiving Chemotherapy  Today you received the following chemotherapy agents: Oxaliplatin, Leucovorin and 5FU.  To help prevent nausea and vomiting after your treatment, we encourage you to take your nausea medication as directed.    If you develop nausea and vomiting that is not controlled by your nausea medication, call  the clinic.   BELOW ARE SYMPTOMS THAT SHOULD BE REPORTED IMMEDIATELY:  *FEVER GREATER THAN 100.5 F  *CHILLS WITH OR WITHOUT FEVER  NAUSEA AND VOMITING THAT IS NOT CONTROLLED WITH YOUR NAUSEA MEDICATION  *UNUSUAL SHORTNESS OF BREATH  *UNUSUAL BRUISING OR BLEEDING  TENDERNESS IN MOUTH AND THROAT WITH OR WITHOUT PRESENCE OF ULCERS  *URINARY PROBLEMS  *BOWEL PROBLEMS  UNUSUAL RASH Items with * indicate a potential emergency and should be followed up as soon as possible.  Feel free to call the clinic should you have any questions or concerns. The clinic phone number is [phone].  Please show the [facility] at check-in to the Emergency Department and triage nurse.

## 2019-03-18 ENCOUNTER — Inpatient Hospital Stay: Payer: Medicare Other

## 2019-03-26 ENCOUNTER — Other Ambulatory Visit: Payer: Self-pay | Admitting: Oncology

## 2019-03-30 ENCOUNTER — Inpatient Hospital Stay: Payer: Medicare Other

## 2019-03-30 ENCOUNTER — Inpatient Hospital Stay: Payer: Medicare Other | Attending: Oncology | Admitting: Oncology

## 2019-03-30 ENCOUNTER — Other Ambulatory Visit: Payer: Medicare Other

## 2019-03-30 ENCOUNTER — Other Ambulatory Visit: Payer: Self-pay

## 2019-03-30 ENCOUNTER — Ambulatory Visit (HOSPITAL_COMMUNITY)
Admission: RE | Admit: 2019-03-30 | Discharge: 2019-03-30 | Disposition: A | Discharge status: Home / Self Care | Payer: Medicare Other | Source: Ambulatory Visit | Attending: Oncology | Admitting: Oncology

## 2019-03-30 ENCOUNTER — Other Ambulatory Visit: Payer: Self-pay | Admitting: Oncology

## 2019-03-30 ENCOUNTER — Telehealth: Payer: Self-pay | Admitting: Oncology

## 2019-03-30 VITALS — BP 137/76 | HR 100 | Temp 97.7°F | Resp 18 | Ht 70.0 in | Wt 172.6 lb

## 2019-03-30 DIAGNOSIS — C155 Malignant neoplasm of lower third of esophagus: Secondary | ICD-10-CM | POA: Diagnosis present

## 2019-03-30 DIAGNOSIS — I129 Hypertensive chronic kidney disease with stage 1 through stage 4 chronic kidney disease, or unspecified chronic kidney disease: Secondary | ICD-10-CM

## 2019-03-30 DIAGNOSIS — Z5111 Encounter for antineoplastic chemotherapy: Secondary | ICD-10-CM | POA: Insufficient documentation

## 2019-03-30 DIAGNOSIS — J90 Pleural effusion, not elsewhere classified: Secondary | ICD-10-CM | POA: Diagnosis not present

## 2019-03-30 DIAGNOSIS — M109 Gout, unspecified: Secondary | ICD-10-CM

## 2019-03-30 DIAGNOSIS — C160 Malignant neoplasm of cardia: Secondary | ICD-10-CM | POA: Insufficient documentation

## 2019-03-30 DIAGNOSIS — Z5112 Encounter for antineoplastic immunotherapy: Secondary | ICD-10-CM | POA: Insufficient documentation

## 2019-03-30 DIAGNOSIS — Z931 Gastrostomy status: Secondary | ICD-10-CM

## 2019-03-30 DIAGNOSIS — R911 Solitary pulmonary nodule: Secondary | ICD-10-CM

## 2019-03-30 DIAGNOSIS — Z95828 Presence of other vascular implants and grafts: Secondary | ICD-10-CM

## 2019-03-30 DIAGNOSIS — N189 Chronic kidney disease, unspecified: Secondary | ICD-10-CM

## 2019-03-30 DIAGNOSIS — E1122 Type 2 diabetes mellitus with diabetic chronic kidney disease: Secondary | ICD-10-CM

## 2019-03-30 LAB — CMP (CANCER CENTER ONLY)
ALT: 29 U/L (ref 0–44)
AST: 28 U/L (ref 15–41)
Albumin: 3.5 g/dL (ref 3.5–5.0)
Alkaline Phosphatase: 155 U/L — ABNORMAL HIGH (ref 38–126)
Anion gap: 12 (ref 5–15)
BUN: 24 mg/dL — ABNORMAL HIGH (ref 8–23)
CO2: 29 mmol/L (ref 22–32)
Calcium: 9.6 mg/dL (ref 8.9–10.3)
Chloride: 95 mmol/L — ABNORMAL LOW (ref 98–111)
Creatinine: 1.79 mg/dL — ABNORMAL HIGH (ref 0.61–1.24)
GFR, Est AFR Am: 43 mL/min — ABNORMAL LOW (ref >60)
GFR, Estimated: 37 mL/min — ABNORMAL LOW (ref >60)
Glucose, Bld: 153 mg/dL — ABNORMAL HIGH (ref 70–99)
Potassium: 3.1 mmol/L — ABNORMAL LOW (ref 3.5–5.1)
Sodium: 136 mmol/L (ref 135–145)
Total Bilirubin: 0.6 mg/dL (ref 0.3–1.2)
Total Protein: 7.1 g/dL (ref 6.5–8.1)

## 2019-03-30 LAB — CBC WITH DIFFERENTIAL (CANCER CENTER ONLY)
Abs Immature Granulocytes: 0.02 10*3/uL (ref 0.00–0.07)
Basophils Absolute: 0 10*3/uL (ref 0.0–0.1)
Basophils Relative: 0 %
Eosinophils Absolute: 0.1 10*3/uL (ref 0.0–0.5)
Eosinophils Relative: 1 %
HCT: 35.9 % — ABNORMAL LOW (ref 39.0–52.0)
Hemoglobin: 11.8 g/dL — ABNORMAL LOW (ref 13.0–17.0)
Immature Granulocytes: 0 %
Lymphocytes Relative: 14 %
Lymphs Abs: 0.9 10*3/uL (ref 0.7–4.0)
MCH: 29.4 pg (ref 26.0–34.0)
MCHC: 32.9 g/dL (ref 30.0–36.0)
MCV: 89.3 fL (ref 80.0–100.0)
Monocytes Absolute: 1.6 10*3/uL — ABNORMAL HIGH (ref 0.1–1.0)
Monocytes Relative: 26 %
Neutro Abs: 3.6 10*3/uL (ref 1.7–7.7)
Neutrophils Relative %: 59 %
Platelet Count: 224 10*3/uL (ref 150–400)
RBC: 4.02 MIL/uL — ABNORMAL LOW (ref 4.22–5.81)
RDW: 16.4 % — ABNORMAL HIGH (ref 11.5–15.5)
WBC Count: 6.2 10*3/uL (ref 4.0–10.5)
nRBC: 0 % (ref 0.0–0.2)

## 2019-03-30 MED ORDER — OXALIPLATIN CHEMO INJECTION 100 MG/20ML
85.0000 mg/m2 | Freq: Once | INTRAVENOUS | Status: AC
  Administered 2019-03-30: 13:00:00 165 mg via INTRAVENOUS
  Filled 2019-03-30: qty 33

## 2019-03-30 MED ORDER — SODIUM CHLORIDE 0.9 % IV SOLN
Freq: Once | INTRAVENOUS | Status: AC
  Administered 2019-03-30: 12:00:00 via INTRAVENOUS
  Filled 2019-03-30: qty 250

## 2019-03-30 MED ORDER — PALONOSETRON HCL INJECTION 0.25 MG/5ML
INTRAVENOUS | Status: AC
  Filled 2019-03-30: qty 5

## 2019-03-30 MED ORDER — DEXAMETHASONE SODIUM PHOSPHATE 10 MG/ML IJ SOLN
10.0000 mg | Freq: Once | INTRAMUSCULAR | Status: AC
  Administered 2019-03-30: 12:00:00 10 mg via INTRAVENOUS

## 2019-03-30 MED ORDER — SODIUM CHLORIDE 0.9% FLUSH
10.0000 mL | INTRAVENOUS | Status: DC | PRN
  Administered 2019-03-30: 10 mL
  Filled 2019-03-30: qty 10

## 2019-03-30 MED ORDER — DEXTROSE 5 % IV SOLN
Freq: Once | INTRAVENOUS | Status: AC
  Administered 2019-03-30: 11:00:00 via INTRAVENOUS
  Filled 2019-03-30: qty 250

## 2019-03-30 MED ORDER — LEUCOVORIN CALCIUM INJECTION 350 MG
400.0000 mg/m2 | Freq: Once | INTRAVENOUS | Status: AC
  Administered 2019-03-30: 788 mg via INTRAVENOUS
  Filled 2019-03-30: qty 39.4

## 2019-03-30 MED ORDER — ACETAMINOPHEN 325 MG PO TABS: 650.0000 mg | ORAL_TABLET | Freq: Once | ORAL | Status: DC

## 2019-03-30 MED ORDER — DEXAMETHASONE SODIUM PHOSPHATE 10 MG/ML IJ SOLN
INTRAMUSCULAR | Status: AC
  Filled 2019-03-30: qty 1

## 2019-03-30 MED ORDER — DEXTROSE 5 % IV SOLN
Freq: Once | INTRAVENOUS | Status: AC
  Administered 2019-03-30: 13:00:00 via INTRAVENOUS
  Filled 2019-03-30: qty 250

## 2019-03-30 MED ORDER — PALONOSETRON HCL INJECTION 0.25 MG/5ML
0.2500 mg | Freq: Once | INTRAVENOUS | Status: AC
  Administered 2019-03-30: 0.25 mg via INTRAVENOUS

## 2019-03-30 MED ORDER — SODIUM CHLORIDE 0.9 % IV SOLN
2400.0000 mg/m2 | INTRAVENOUS | Status: DC
  Administered 2019-03-30: 15:00:00 4750 mg via INTRAVENOUS
  Filled 2019-03-30: qty 95

## 2019-03-30 MED ORDER — TRASTUZUMAB CHEMO 150 MG IV SOLR: 4.0000 mg/kg | Freq: Once | INTRAVENOUS | Status: DC

## 2019-03-30 MED ORDER — DIPHENHYDRAMINE HCL 25 MG PO CAPS: 50.0000 mg | ORAL_CAPSULE | Freq: Once | ORAL | Status: DC

## 2019-03-30 MED ORDER — FLUOROURACIL CHEMO INJECTION 2.5 GM/50ML
400.0000 mg/m2 | Freq: Once | INTRAVENOUS | Status: AC
  Administered 2019-03-30: 800 mg via INTRAVENOUS
  Filled 2019-03-30: qty 16

## 2019-03-30 NOTE — Telephone Encounter (Signed)
Scheduled appt per 6/4 los. Added treatment to the book for approval. Will contact the patient once treatment has been added.

## 2019-03-30 NOTE — Progress Notes (Signed)
@   1328: Lewayne Bunting, RN at [facility] to disconnect his infusion pump on Saturday at 1 pm.

## 2019-03-30 NOTE — Progress Notes (Signed)
[facility] OFFICE PROGRESS NOTE   Diagnosis: Gastroesophageal cancer  INTERVAL HISTORY:   Mr. Zumstein complete another cycle of FOLFOX on 03/16/2019.  He had cold sensitivity and tingling in the fingers on the day of chemotherapy.  No other neuropathy symptoms.  He continues to tolerate liquids and a soft diet.  He continues feedings via the gastrostomy tube.  Objective:  Vital signs in last 24 hours:  Blood pressure 137/76, pulse 100, temperature 97.7 F (36.5 C), temperature source Oral, resp. rate 18, height _0  (1.778 m), weight 172 lb 9.6 oz (78.3 kg), SpO2 100 %.     Resp: Lungs clear bilaterally Cardio: Regular rate and rhythm GI: No hepatomegaly, nontender, feeding tube site without evidence of infection Vascular: No leg edema  Skin: Mild hyperpigmentation of the hands  Portacath/PICC-without erythema  Lab Results:  Lab Results  Component Value Date   WBC 6.2 03/30/2019   HGB 11.8 (L) 03/30/2019   HCT 35.9 (L) 03/30/2019   MCV 89.3 03/30/2019   PLT 224 03/30/2019   NEUTROABS 3.6 03/30/2019    CMP  Lab Results  Component Value Date   NA 136 03/30/2019   K 3.1 (L) 03/30/2019   CL 95 (L) 03/30/2019   CO2 29 03/30/2019   GLUCOSE 153 (H) 03/30/2019   BUN 24 (H) 03/30/2019   CREATININE 1.79 (H) 03/30/2019   CALCIUM 9.6 03/30/2019   PROT 7.1 03/30/2019   ALBUMIN 3.5 03/30/2019   AST 28 03/30/2019   ALT 29 03/30/2019   ALKPHOS 155 (H) 03/30/2019   BILITOT 0.6 03/30/2019   GFRNONAA 37 (L) 03/30/2019   GFRAA 43 (L) 03/30/2019    Lab Results  Component Value Date   CEA1 5.10 (H) 01/17/2019     Imaging: CT chest 03/30/2019- preliminary review by me- persistent thickening at the GE junction, decrease in the size of a left apical nodule  Medications: I have reviewed the patient's current medications.   Assessment/Plan: 1. 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 Partially obstructing esophagus mass  02/01/2017, biopsy confirmed moderately differentiated adenocarcinoma ? Staging CTs 03/06/2017-esophagus mass, single noncalcified pulmonary nodule the medial aspect of the left lower lobe, evidence of prior granulomatous disease, right adrenal adenoma ? PET scan 04/07/2017-hypermetabolic distal esophageal mass, no evidence of metastatic disease in the abdomen, pelvis, or skeleton. The right adrenal mass on CT was not hypermetabolic and favored to be an adenoma ? Radiation and concurrent weekly Taxol/carboplatin 04/20/2017-05/28/2017 ? CTs 07/05/2018-improvement in esophagus mass ? Endoscopy 07/26/2017-ulceration and friability from the GE junction up to 35 cm, 2 cm mass at the GE junction-improved in size, biopsy revealed no malignancy ? PET scan 08/19/2017-hypermetabolic activity at the distal esophagus and GE junction, no evidence of distant metastatic disease ? Admission to [facility] 11/18/2018 with a syncope event secondary to upper GI bleeding ? Upper endoscopy 11/18/2018-obstructing mass at 40 cm, biopsies revealed inflammation and no malignancy ? CTs 11/20/2018-10 mm nodule at the left lung apex, thickening at the distal esophagus, evidence of prior granulomatous disease with calcified right lung nodules and right paratracheal/hilar lymph nodes ? Upper endoscopy 12/07/2026- pinpoint stricture in the distal esophagus, lumen was 1 mm. Gastroesophageal mucosa with exudate consistent with ulcer. No malignancy.  ? PET scan 12/19/2018- focal area of increased uptake at or just below the level of the GE junction; moderate FDG uptake associated with left apical lung nodule. Bilateral pleural effusions and lower lobe atelectasis. ? Endoscopy via gastrostomy tube  tract 01/05/2019- mass at the gastric cardia/GE junction, biopsy confirmed adenocarcinoma, CPS-5,Her-2  3+ ? Cycle 1 FOLFOX 01/18/2019 ? Cycle 2 FOLFOX 02/02/2019 ? Cycle 3 FOLFOX 02/16/2019 ? Cycle 4 FOLFOX 03/02/2019 ? Cycle 5 FOLFOX  03/16/2019 ? CT chest-new cavitary change in the 1 cm left upper lobe pulmonary nodule, stable in size.  Stable circumferential wall thickening of the lower thoracic esophagus/GE junction, no progressive disease ? Cycle 6 FOLFOX 03/30/2019 2. Upper GI bleeding secondary to #1, 11/18/2018-transfused with a total of 4 units of packed red blood cells 3. Nutrition-placement of gastrostomy feeding tube 11/22/2018  Feeding tube placed initial treatment in 2018, removed May 2019  4.Diabetes 5.Hypertension 6.Gout  7.Chronic renal failure     Disposition: Mr. Lance Herring appears unchanged.  He will complete another cycle of FOLFOX today.  His clinical status has improved since beginning FOLFOX.  I will follow-up on the final report from the chest CT today.  She will begin a liquid potassium supplement. Mr. Zuver will return for an office visit and chemotherapy in 2 weeks.  I will present his case at the GI tumor conference within the next few weeks to again discuss whether he will be a surgical candidate.  The biopsy from 01/05/2019 returned HER-2 positive.  I discussed the addition of Herceptin with Mr. Valente.  He agrees to proceed with Herceptin.  We reviewed potential toxicities associated with Herceptin including the chance of allergic reaction and cardiac toxicity.  He had an echocardiogram in January.  Mr. Jacuinde would like to delay adding Herceptin until he returns in 2 weeks.  I will present his case at the GI tumor conference within the next few weeks.  Betsy Coder, MD  03/30/2019  10:16 AM

## 2019-03-30 NOTE — Progress Notes (Signed)
Per Dr. Benay Spice okay to treat with crt 1.79

## 2019-03-30 NOTE — Patient Instructions (Signed)

## 2019-03-30 NOTE — Progress Notes (Signed)
MD adding Herceptin today. OK w/ ECHO results from 10/2018.  EF = 80%  No PA required. Kennith Center, Pharm.D., CPP 03/30/2019@11 :58 AM

## 2019-03-30 NOTE — Patient Instructions (Addendum)
Coronavirus (COVID-19) Are you at risk?  Are you at risk for the Coronavirus (COVID-19)?  To be considered HIGH RISK for Coronavirus (COVID-19), you have to meet the following criteria:  . Traveled to [place], [place], [place], [place] or [place]; or in the [place] to [place], [place], [place], or [place]; and have fever, cough, and shortness of breath within the last 2 weeks of travel OR . Been in close contact with a person diagnosed with COVID-19 within the last 2 weeks and have fever, cough, and shortness of breath . IF YOU DO NOT MEET THESE CRITERIA, YOU ARE CONSIDERED LOW RISK FOR COVID-19.  What to do if you are HIGH RISK for COVID-19?  . If you are having a medical emergency, call 911. . Seek medical care right away. Before you go to a doctor's office, urgent care or emergency department, call ahead and tell them about your recent travel, contact with someone diagnosed with COVID-19, and your symptoms. You should receive instructions from your physician's office regarding next steps of care.  . When you arrive at healthcare provider, tell the healthcare staff immediately you have returned from visiting [place], [place], [place], [place] or [place]; or traveled in the [place] to [place], [place], [place], or [place]; in the last two weeks or you have been in close contact with a person diagnosed with COVID-19 in the last 2 weeks.   . Tell the health care staff about your symptoms: fever, cough and shortness of breath. . After you have been seen by a medical provider, you will be either: o Tested for (COVID-19) and discharged home on quarantine except to seek medical care if symptoms worsen, and asked to  - Stay home and avoid contact with others until you get your results (4-5 days)  - Avoid travel on public transportation if possible (such as bus, train, or airplane) or o Sent to the Emergency Department by EMS for evaluation, COVID-19 testing, and possible  admission depending on your condition and test results.  What to do if you are LOW RISK for COVID-19?  Reduce your risk of any infection by using the same precautions used for avoiding the common cold or flu:  . Wash your hands often with soap and warm water for at least 20 seconds.  If soap and water are not readily available, use an alcohol-based hand sanitizer with at least 60% alcohol.  . If coughing or sneezing, cover your mouth and nose by coughing or sneezing into the elbow areas of your shirt or coat, into a tissue or into your sleeve (not your hands). . Avoid shaking hands with others and consider head nods or verbal greetings only. . Avoid touching your eyes, nose, or mouth with unwashed hands.  . Avoid close contact with people who are sick. . Avoid places or events with large numbers of people in one location, like concerts or sporting events. . Carefully consider travel plans you have or are making. . If you are planning any travel outside or inside the [place], visit the CDC's Travelers' Health webpage for the latest health notices. . If you have some symptoms but not all symptoms, continue to monitor at home and seek medical attention if your symptoms worsen. . If you are having a medical emergency, call 911.   ADDITIONAL HEALTHCARE OPTIONS FOR PATIENTS  [facility] / e-Visit: [email or web address]         [facility]: [phone]  [facility]   Urgent Care: [facility]: [phone]    Trastuzumab injection for infusion What is this medicine? TRASTUZUMAB (tras TOO zoo mab) is a monoclonal antibody. It is used to treat breast cancer and stomach cancer. This medicine may be used for other purposes; ask your health care provider or pharmacist if you have questions. COMMON BRAND NAME(S): Herceptin, Calla Kicks, OGIVRI What should I tell my health care provider before I take  this medicine? They need to know if you have any of these conditions: -heart disease -heart failure -lung or breathing disease, like asthma -an unusual or allergic reaction to trastuzumab, benzyl alcohol, or other medications, foods, dyes, or preservatives -pregnant or trying to get pregnant -breast-feeding How should I use this medicine? This drug is given as an infusion into a vein. It is administered in a hospital or clinic by a specially trained health care professional. Talk to your pediatrician regarding the use of this medicine in children. This medicine is not approved for use in children. Overdosage: If you think you have taken too much of this medicine contact a poison control center or emergency room at once. NOTE: This medicine is only for you. Do not share this medicine with others. What if I miss a dose? It is important not to miss a dose. Call your doctor or health care professional if you are unable to keep an appointment. What may interact with this medicine? This medicine may interact with the following medications: -certain types of chemotherapy, such as daunorubicin, doxorubicin, epirubicin, and idarubicin This list may not describe all possible interactions. Give your health care provider a list of all the medicines, herbs, non-prescription drugs, or dietary supplements you use. Also tell them if you smoke, drink alcohol, or use illegal drugs. Some items may interact with your medicine. What should I watch for while using this medicine? Visit your doctor for checks on your progress. Report any side effects. Continue your course of treatment even though you feel ill unless your doctor tells you to stop. Call your doctor or health care professional for advice if you get a fever, chills or sore throat, or other symptoms of a cold or flu. Do not treat yourself. Try to avoid being around people who are sick. You may experience fever, chills and shaking during your first  infusion. These effects are usually mild and can be treated with other medicines. Report any side effects during the infusion to your health care professional. Fever and chills usually do not happen with later infusions. Do not become pregnant while taking this medicine or for 7 months after stopping it. Women should inform their doctor if they wish to become pregnant or think they might be pregnant. Women of child-bearing potential will need to have a negative pregnancy test before starting this medicine. There is a potential for serious side effects to an unborn child. Talk to your health care professional or pharmacist for more information. Do not breast-feed an infant while taking this medicine or for 7 months after stopping it. Women must use effective birth control with this medicine. What side effects may I notice from receiving this medicine? Side effects that you should report to your doctor or health care professional as soon as possible: -allergic reactions like skin rash, itching or hives, swelling of the face, lips, or tongue -chest pain or palpitations -cough -dizziness -feeling faint or lightheaded, falls -fever -  general ill feeling or flu-like symptoms -signs of worsening heart failure like breathing problems; swelling in your legs and feet -unusually weak or tired Side effects that usually do not require medical attention (report to your doctor or health care professional if they continue or are bothersome): -bone pain -changes in taste -diarrhea -joint pain -nausea/vomiting -weight loss This list may not describe all possible side effects. Call your doctor for medical advice about side effects. You may report side effects to FDA at 1-800-FDA-1088. Where should I keep my medicine? This drug is given in a hospital or clinic and will not be stored at home. NOTE: This sheet is a summary. It may not cover all possible information. If you have questions about this medicine, talk  to your doctor, pharmacist, or health care provider.  2019 Elsevier/Gold Standard (2016-10-06 14:37:52)

## 2019-03-30 NOTE — Progress Notes (Signed)
MD has adjusted chemo doses today - wt change. Kennith Center, Pharm.D., CPP 03/30/2019@11 :17 AM

## 2019-03-30 NOTE — Progress Notes (Signed)
Pt prefers to wait to start Herceptin until next cycle (due 04/13/19).  Did not want to stay the extra time for loading dose to infuse (90 min) per Dr. Benay Spice. Orders deferred accordingly.  Kennith Center, Pharm.D., CPP 03/30/2019@12 :52 PM

## 2019-03-31 ENCOUNTER — Other Ambulatory Visit: Payer: Self-pay | Admitting: *Deleted

## 2019-03-31 MED ORDER — POTASSIUM CHLORIDE 20 MEQ PO PACK: 20.0000 meq | PACK | Freq: Every day | ORAL | 1 refills | Status: DC

## 2019-03-31 MED ORDER — POTASSIUM CHLORIDE CRYS ER 20 MEQ PO TBCR: EXTENDED_RELEASE_TABLET | ORAL | 1 refills | Status: DC

## 2019-04-11 ENCOUNTER — Other Ambulatory Visit: Payer: Self-pay | Admitting: Oncology

## 2019-04-13 ENCOUNTER — Inpatient Hospital Stay: Payer: Medicare Other

## 2019-04-13 ENCOUNTER — Other Ambulatory Visit: Payer: Self-pay

## 2019-04-13 ENCOUNTER — Encounter: Payer: Self-pay | Admitting: *Deleted

## 2019-04-13 ENCOUNTER — Encounter: Payer: Self-pay | Admitting: Nurse Practitioner

## 2019-04-13 ENCOUNTER — Inpatient Hospital Stay (HOSPITAL_BASED_OUTPATIENT_CLINIC_OR_DEPARTMENT_OTHER): Payer: Medicare Other | Admitting: Nurse Practitioner

## 2019-04-13 VITALS — BP 153/81 | HR 106 | Temp 98.4°F | Resp 17 | Ht 70.0 in | Wt 171.1 lb

## 2019-04-13 VITALS — HR 98

## 2019-04-13 DIAGNOSIS — M109 Gout, unspecified: Secondary | ICD-10-CM

## 2019-04-13 DIAGNOSIS — K5900 Constipation, unspecified: Secondary | ICD-10-CM

## 2019-04-13 DIAGNOSIS — C155 Malignant neoplasm of lower third of esophagus: Secondary | ICD-10-CM

## 2019-04-13 DIAGNOSIS — E1122 Type 2 diabetes mellitus with diabetic chronic kidney disease: Secondary | ICD-10-CM

## 2019-04-13 DIAGNOSIS — R911 Solitary pulmonary nodule: Secondary | ICD-10-CM | POA: Diagnosis not present

## 2019-04-13 DIAGNOSIS — R21 Rash and other nonspecific skin eruption: Secondary | ICD-10-CM

## 2019-04-13 DIAGNOSIS — Z95828 Presence of other vascular implants and grafts: Secondary | ICD-10-CM

## 2019-04-13 DIAGNOSIS — C160 Malignant neoplasm of cardia: Secondary | ICD-10-CM

## 2019-04-13 DIAGNOSIS — J90 Pleural effusion, not elsewhere classified: Secondary | ICD-10-CM

## 2019-04-13 DIAGNOSIS — Z931 Gastrostomy status: Secondary | ICD-10-CM

## 2019-04-13 DIAGNOSIS — N189 Chronic kidney disease, unspecified: Secondary | ICD-10-CM

## 2019-04-13 DIAGNOSIS — I129 Hypertensive chronic kidney disease with stage 1 through stage 4 chronic kidney disease, or unspecified chronic kidney disease: Secondary | ICD-10-CM

## 2019-04-13 DIAGNOSIS — Z5112 Encounter for antineoplastic immunotherapy: Secondary | ICD-10-CM | POA: Diagnosis not present

## 2019-04-13 LAB — CBC WITH DIFFERENTIAL (CANCER CENTER ONLY)
Abs Immature Granulocytes: 0.01 10*3/uL (ref 0.00–0.07)
Basophils Absolute: 0 10*3/uL (ref 0.0–0.1)
Basophils Relative: 1 %
Eosinophils Absolute: 0.1 10*3/uL (ref 0.0–0.5)
Eosinophils Relative: 2 %
HCT: 34.6 % — ABNORMAL LOW (ref 39.0–52.0)
Hemoglobin: 11.3 g/dL — ABNORMAL LOW (ref 13.0–17.0)
Immature Granulocytes: 0 %
Lymphocytes Relative: 20 %
Lymphs Abs: 0.9 10*3/uL (ref 0.7–4.0)
MCH: 29.4 pg (ref 26.0–34.0)
MCHC: 32.7 g/dL (ref 30.0–36.0)
MCV: 90.1 fL (ref 80.0–100.0)
Monocytes Absolute: 1.3 10*3/uL — ABNORMAL HIGH (ref 0.1–1.0)
Monocytes Relative: 28 %
Neutro Abs: 2.3 10*3/uL (ref 1.7–7.7)
Neutrophils Relative %: 49 %
Platelet Count: 200 10*3/uL (ref 150–400)
RBC: 3.84 MIL/uL — ABNORMAL LOW (ref 4.22–5.81)
RDW: 17.5 % — ABNORMAL HIGH (ref 11.5–15.5)
WBC Count: 4.7 10*3/uL (ref 4.0–10.5)
nRBC: 0 % (ref 0.0–0.2)

## 2019-04-13 LAB — CMP (CANCER CENTER ONLY)
ALT: 28 U/L (ref 0–44)
AST: 28 U/L (ref 15–41)
Albumin: 3.3 g/dL — ABNORMAL LOW (ref 3.5–5.0)
Alkaline Phosphatase: 149 U/L — ABNORMAL HIGH (ref 38–126)
Anion gap: 13 (ref 5–15)
BUN: 27 mg/dL — ABNORMAL HIGH (ref 8–23)
CO2: 35 mmol/L — ABNORMAL HIGH (ref 22–32)
Calcium: 9.5 mg/dL (ref 8.9–10.3)
Chloride: 90 mmol/L — ABNORMAL LOW (ref 98–111)
Creatinine: 1.89 mg/dL — ABNORMAL HIGH (ref 0.61–1.24)
GFR, Est AFR Am: 40 mL/min — ABNORMAL LOW (ref >60)
GFR, Estimated: 35 mL/min — ABNORMAL LOW (ref >60)
Glucose, Bld: 251 mg/dL — ABNORMAL HIGH (ref 70–99)
Potassium: 2.9 mmol/L — CL (ref 3.5–5.1)
Sodium: 138 mmol/L (ref 135–145)
Total Bilirubin: 0.9 mg/dL (ref 0.3–1.2)
Total Protein: 6.8 g/dL (ref 6.5–8.1)

## 2019-04-13 MED ORDER — ACETAMINOPHEN 325 MG PO TABS
ORAL_TABLET | ORAL | Status: AC
  Filled 2019-04-13: qty 2

## 2019-04-13 MED ORDER — TRASTUZUMAB CHEMO 150 MG IV SOLR
450.0000 mg | Freq: Once | INTRAVENOUS | Status: AC
  Administered 2019-04-13: 450 mg via INTRAVENOUS
  Filled 2019-04-13: qty 21.43

## 2019-04-13 MED ORDER — ACETAMINOPHEN 160 MG/5ML PO SOLN
650.0000 mg | Freq: Once | ORAL | Status: AC
  Administered 2019-04-13: 650 mg via ORAL

## 2019-04-13 MED ORDER — DIPHENHYDRAMINE HCL 25 MG PO CAPS: 50.0000 mg | ORAL_CAPSULE | Freq: Once | ORAL | Status: DC

## 2019-04-13 MED ORDER — DIPHENHYDRAMINE HCL 12.5 MG/5ML PO ELIX
ORAL_SOLUTION | ORAL | Status: AC
  Filled 2019-04-13: qty 15

## 2019-04-13 MED ORDER — PALONOSETRON HCL INJECTION 0.25 MG/5ML
INTRAVENOUS | Status: AC
  Filled 2019-04-13: qty 5

## 2019-04-13 MED ORDER — OXALIPLATIN CHEMO INJECTION 100 MG/20ML
85.0000 mg/m2 | Freq: Once | INTRAVENOUS | Status: AC
  Administered 2019-04-13: 165 mg via INTRAVENOUS
  Filled 2019-04-13: qty 33

## 2019-04-13 MED ORDER — SODIUM CHLORIDE 0.9 % IV SOLN
Freq: Once | INTRAVENOUS | Status: AC
  Administered 2019-04-13: 11:00:00 via INTRAVENOUS
  Filled 2019-04-13: qty 250

## 2019-04-13 MED ORDER — FLUOROURACIL CHEMO INJECTION 2.5 GM/50ML
400.0000 mg/m2 | Freq: Once | INTRAVENOUS | Status: AC
  Administered 2019-04-13: 800 mg via INTRAVENOUS
  Filled 2019-04-13: qty 16

## 2019-04-13 MED ORDER — SODIUM CHLORIDE 0.9 % IV SOLN
2400.0000 mg/m2 | INTRAVENOUS | Status: DC
  Administered 2019-04-13: 4750 mg via INTRAVENOUS
  Filled 2019-04-13: qty 95

## 2019-04-13 MED ORDER — DEXTROSE 5 % IV SOLN
Freq: Once | INTRAVENOUS | Status: AC
  Administered 2019-04-13: 14:00:00 via INTRAVENOUS
  Filled 2019-04-13: qty 250

## 2019-04-13 MED ORDER — DIPHENHYDRAMINE HCL 25 MG PO CAPS
ORAL_CAPSULE | ORAL | Status: AC
  Filled 2019-04-13: qty 2

## 2019-04-13 MED ORDER — DEXAMETHASONE SODIUM PHOSPHATE 10 MG/ML IJ SOLN
10.0000 mg | Freq: Once | INTRAMUSCULAR | Status: AC
  Administered 2019-04-13: 10 mg via INTRAVENOUS

## 2019-04-13 MED ORDER — DEXAMETHASONE SODIUM PHOSPHATE 10 MG/ML IJ SOLN
INTRAMUSCULAR | Status: AC
  Filled 2019-04-13: qty 1

## 2019-04-13 MED ORDER — SODIUM CHLORIDE 0.9% FLUSH
10.0000 mL | INTRAVENOUS | Status: DC | PRN
  Administered 2019-04-13: 10 mL
  Filled 2019-04-13: qty 10

## 2019-04-13 MED ORDER — ACETAMINOPHEN 160 MG/5ML PO SOLN
ORAL | Status: AC
  Filled 2019-04-13: qty 20.3

## 2019-04-13 MED ORDER — ACETAMINOPHEN 325 MG PO TABS: 650.0000 mg | ORAL_TABLET | Freq: Once | ORAL | Status: DC

## 2019-04-13 MED ORDER — DIPHENHYDRAMINE HCL 12.5 MG/5ML PO ELIX
50.0000 mg | ORAL_SOLUTION | Freq: Once | ORAL | Status: AC
  Administered 2019-04-13: 50 mg via ORAL

## 2019-04-13 MED ORDER — PALONOSETRON HCL INJECTION 0.25 MG/5ML
0.2500 mg | Freq: Once | INTRAVENOUS | Status: AC
  Administered 2019-04-13: 14:00:00 0.25 mg via INTRAVENOUS

## 2019-04-13 MED ORDER — LEUCOVORIN CALCIUM INJECTION 350 MG
400.0000 mg/m2 | Freq: Once | INTRAVENOUS | Status: AC
  Administered 2019-04-13: 788 mg via INTRAVENOUS
  Filled 2019-04-13: qty 39.4

## 2019-04-13 NOTE — Progress Notes (Signed)
Oncology Nurse Navigator Documentation  In follow-up to call from [place], met with Lance Herring in Infusion to evaluate gastrostomy tube.  He explained/demonstrated that rounded protrusion on adapter cap had broken off and he had to manually remove/replace it when using tube.  Following discussion with [name] and PA Lorenda Ishihara IR, I provided [name] valve.  After removing rounded protrusion, attached valve to adapter, demonstrated use.    [name] verbalized step-wise instructions, provided return demonstration with bolus water instillation.  He voiced confidence in use of valve.  I provided him pack of mesh briefs, explained/demonstrated use as alternative to tape for securing gastrostomy tube when not in use.  He expressed appreciation for my assistance. I later provided RN Beth with update.  Gayleen Orem, RN, BSN Head & Neck Oncology Nurse [place] [facility] [place] [phone]

## 2019-04-13 NOTE — Progress Notes (Signed)
OK to treat with K+ of 2.9 and creatinine of 1.89 per Ned Card, NP and Dr. Benay Spice

## 2019-04-13 NOTE — Patient Instructions (Addendum)
[facility] Discharge Instructions for Patients Receiving Chemotherapy  Today you received the following chemotherapy agents trasatuzumab, oxaliplatin, leucovorin, 5 FU  To help prevent nausea and vomiting after your treatment, we encourage you to take your nausea medication as directed.   If you develop nausea and vomiting that is not controlled by your nausea medication, call the clinic.   BELOW ARE SYMPTOMS THAT SHOULD BE REPORTED IMMEDIATELY:  *FEVER GREATER THAN 100.5 F  *CHILLS WITH OR WITHOUT FEVER  NAUSEA AND VOMITING THAT IS NOT CONTROLLED WITH YOUR NAUSEA MEDICATION  *UNUSUAL SHORTNESS OF BREATH  *UNUSUAL BRUISING OR BLEEDING  TENDERNESS IN MOUTH AND THROAT WITH OR WITHOUT PRESENCE OF ULCERS  *URINARY PROBLEMS  *BOWEL PROBLEMS  UNUSUAL RASH Items with * indicate a potential emergency and should be followed up as soon as possible.  Feel free to call the clinic should you have any questions or concerns. The clinic phone number is [phone].  Please show the [place] at check-in to the Emergency Department and triage nurse.     Trastuzumab injection for infusion What is this medicine? TRASTUZUMAB (tras TOO zoo mab) is a monoclonal antibody. It is used to treat breast cancer and stomach cancer. This medicine may be used for other purposes; ask your health care provider or pharmacist if you have questions. COMMON BRAND NAME(S): Herceptin, Calla Kicks, OGIVRI What should I tell my health care provider before I take this medicine? They need to know if you have any of these conditions: -heart disease -heart failure -lung or breathing disease, like asthma -an unusual or allergic reaction to trastuzumab, benzyl alcohol, or other medications, foods, dyes, or preservatives -pregnant or trying to get pregnant -breast-feeding How should I use this medicine? This drug is given as an infusion into a vein. It is administered in a hospital or clinic  by a specially trained health care professional. Talk to your pediatrician regarding the use of this medicine in children. This medicine is not approved for use in children. Overdosage: If you think you have taken too much of this medicine contact a poison control center or emergency room at once. NOTE: This medicine is only for you. Do not share this medicine with others. What if I miss a dose? It is important not to miss a dose. Call your doctor or health care professional if you are unable to keep an appointment. What may interact with this medicine? This medicine may interact with the following medications: -certain types of chemotherapy, such as daunorubicin, doxorubicin, epirubicin, and idarubicin This list may not describe all possible interactions. Give your health care provider a list of all the medicines, herbs, non-prescription drugs, or dietary supplements you use. Also tell them if you smoke, drink alcohol, or use illegal drugs. Some items may interact with your medicine. What should I watch for while using this medicine? Visit your doctor for checks on your progress. Report any side effects. Continue your course of treatment even though you feel ill unless your doctor tells you to stop. Call your doctor or health care professional for advice if you get a fever, chills or sore throat, or other symptoms of a cold or flu. Do not treat yourself. Try to avoid being around people who are sick. You may experience fever, chills and shaking during your first infusion. These effects are usually mild and can be treated with other medicines. Report any side effects during the infusion to your health care professional. Fever and chills usually do not  happen with later infusions. Do not become pregnant while taking this medicine or for 7 months after stopping it. Women should inform their doctor if they wish to become pregnant or think they might be pregnant. Women of child-bearing potential will need  to have a negative pregnancy test before starting this medicine. There is a potential for serious side effects to an unborn child. Talk to your health care professional or pharmacist for more information. Do not breast-feed an infant while taking this medicine or for 7 months after stopping it. Women must use effective birth control with this medicine. What side effects may I notice from receiving this medicine? Side effects that you should report to your doctor or health care professional as soon as possible: -allergic reactions like skin rash, itching or hives, swelling of the face, lips, or tongue -chest pain or palpitations -cough -dizziness -feeling faint or lightheaded, falls -fever -general ill feeling or flu-like symptoms -signs of worsening heart failure like breathing problems; swelling in your legs and feet -unusually weak or tired Side effects that usually do not require medical attention (report to your doctor or health care professional if they continue or are bothersome): -bone pain -changes in taste -diarrhea -joint pain -nausea/vomiting -weight loss This list may not describe all possible side effects. Call your doctor for medical advice about side effects. You may report side effects to FDA at 1-800-FDA-1088. Where should I keep my medicine? This drug is given in a hospital or clinic and will not be stored at home. NOTE: This sheet is a summary. It may not cover all possible information. If you have questions about this medicine, talk to your doctor, pharmacist, or health care provider.  2019 Elsevier/Gold Standard (2016-10-06 14:37:52)

## 2019-04-13 NOTE — Progress Notes (Signed)
Pt states the part of his PEG that plugs the tube has come apart. He needs a way to plug his PEG tube between feedings.  Contacted Dr. Ardis Hughs -[facility]. They were unable to help. Contacted Gayleen Orem, RN  Head and Neck navigator. He works with patients with PEGS very frequently.  He was able to provide a Eugenio Hoes adapter to patient. He assisted in connecting it and demonstrated how to use it. Per Gayleen Orem, RN -patient was able to re-demonstrate to [facility] on using the adaptor.

## 2019-04-13 NOTE — Progress Notes (Signed)
[facility] OFFICE PROGRESS NOTE   Diagnosis: Gastroesophageal cancer  INTERVAL HISTORY:   Lance Herring returns as scheduled.  He completed cycle 6 FOLFOX 03/30/2019.  He denies nausea/vomiting.  No mouth sores.  No diarrhea.  Some constipation.  Cold sensitivity for approximately 1 day.  No persistent neuropathy symptoms.  He denies bleeding.  Swallowing continues to be improved.  He is tolerating liquids and soft solids.  He has had a small itchy rash at the anterior neck for the past 2 days.  Objective:  Vital signs in last 24 hours:  Blood pressure (!) 153/81, pulse (!) 106, temperature 98.4 F (36.9 C), temperature source Oral, resp. rate 17, height _0  (1.778 m), weight 171 lb 1.6 oz (77.6 kg), SpO2 99 %.    HEENT: No thrush or ulcers. GI: Abdomen soft and nontender.  No hepatomegaly.  Feeding tube without evidence of infection. Vascular: No leg edema. Neuro: Vibratory sense intact over the fingertips per tuning fork exam. Skin: Palms without erythema.  Small area of erythema at the low anterior neck. Port-A-Cath without erythema.   Lab Results:  Lab Results  Component Value Date   WBC 4.7 04/13/2019   HGB 11.3 (L) 04/13/2019   HCT 34.6 (L) 04/13/2019   MCV 90.1 04/13/2019   PLT 200 04/13/2019   NEUTROABS 2.3 04/13/2019    Imaging:  No results found.  Medications: I have reviewed the patient's current medications.  Assessment/Plan: 1. 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 Partially obstructing esophagus mass 02/01/2017, biopsy confirmed moderately differentiated adenocarcinoma ? Staging CTs 03/06/2017-esophagus mass, single noncalcified pulmonary nodule the medial aspect of the left lower lobe, evidence of prior granulomatous disease, right adrenal adenoma ? PET scan 04/07/2017-hypermetabolic distal esophageal mass, no evidence of metastatic disease in the abdomen, pelvis, or skeleton. The right adrenal mass on  CT was not hypermetabolic and favored to be an adenoma ? Radiation and concurrent weekly Taxol/carboplatin 04/20/2017-05/28/2017 ? CTs 07/05/2018-improvement in esophagus mass ? Endoscopy 07/26/2017-ulceration and friability from the GE junction up to 35 cm, 2 cm mass at the GE junction-improved in size, biopsy revealed no malignancy ? PET scan 08/19/2017-hypermetabolic activity at the distal esophagus and GE junction, no evidence of distant metastatic disease ? [facility]/18/2018 with a syncope event secondary to upper GI bleeding ? Upper endoscopy 11/18/2018-obstructing mass at 40 cm, biopsies revealed inflammation and no malignancy ? CTs 11/20/2018-10 mm nodule at the left lung apex, thickening at the distal esophagus, evidence of prior granulomatous disease with calcified right lung nodules and right paratracheal/hilar lymph nodes ? Upper endoscopy 12/07/2026-pinpoint stricture in the distal esophagus, lumen was 1 mm. Gastroesophageal mucosa with exudate consistent with ulcer. No malignancy.  ? PET scan 12/19/2018-focal area of increased uptake at or just below the level of the GE junction; moderate FDG uptake associated with left apical lung nodule. Bilateral pleural effusions and lower lobe atelectasis. ? Endoscopy via gastrostomy tube tract 01/05/2019-mass at the gastric cardia/GE junction, biopsy confirmed adenocarcinoma, CPS-5,Her-2  3+ ? Cycle 1 FOLFOX 01/18/2019 ? Cycle 2 FOLFOX 02/02/2019 ? Cycle 3 FOLFOX 02/16/2019 ? Cycle 4 FOLFOX 03/02/2019 ? Cycle 5 FOLFOX 03/16/2019 ? CT chest-new cavitary change in the 1 cm left upper lobe pulmonary nodule, stable in size.  Stable circumferential wall thickening of the lower thoracic esophagus/GE junction, no progressive disease ? Cycle 6 FOLFOX 03/30/2019 ? Cycle 7 FOLFOX plus Herceptin 04/13/2019 2. Upper GI bleeding secondary to #1, 11/18/2018-transfused with  a total of 4 units of packed red blood cells 3. Nutrition-placement  of gastrostomy feeding tube 11/22/2018  Feeding tube placed initial treatment in 2018, removed May 2019  4.Diabetes 5.Hypertension 6.Gout 7.Chronic renal failure   Disposition: [name] appears stable.  He has completed 6 cycles of FOLFOX.  Plan to proceed with cycle 7 today as scheduled.  Herceptin to be added today.  We again reviewed potential toxicities.  He agrees to proceed.  We reviewed the CBC and chemistry panel from today, adequate for treatment.  He has not been taking potassium consistently.  He will resume 20 mEq daily.  For constipation he will begin MiraLAX daily.  He will return for lab, follow-up, FOLFOX/Herceptin in 2 weeks.  He will contact the office in the interim with any problems.  Plan reviewed with Dr. Benay Spice.      Ned Card ANP/GNP-BC   04/13/2019  10:22 AM

## 2019-04-17 ENCOUNTER — Telehealth: Payer: Self-pay | Admitting: Nurse Practitioner

## 2019-04-17 ENCOUNTER — Other Ambulatory Visit: Payer: Self-pay

## 2019-04-17 ENCOUNTER — Telehealth: Payer: Self-pay

## 2019-04-17 NOTE — Progress Notes (Signed)
Persistent thickening @ GE junction. Positive for Her2, addition of Herceptin to tx plan.  Lung nodule- consider SBRT if doing well with tx for esophageal cancer. Referral to Dr. Servando Snare for surgical consideration.

## 2019-04-17 NOTE — Telephone Encounter (Signed)
I was unable to leave a message because there was no voicemail to verify telephone visit for pre reg

## 2019-04-18 ENCOUNTER — Inpatient Hospital Stay: Payer: Medicare Other

## 2019-04-18 NOTE — Progress Notes (Addendum)
Nutrition Follow-up:  RD working remotely.  Patient with gastric mass/esophageal cancer.  Followed by Dr. Benay Spice and receiving folfox.  Spoke with patient and has been tolerating jevity 1.2, 8 cartons per day (2 cartons QID).  Reports that he uses about 2-3 16.9oz bottles of water daily for flush.  Reports issues with constipation and started taking miralax.  Patient reports that orally he drinks water (about 16.9 oz daily, some soups, baby food fruits mixed with juice, mashed potatoes).  Reports that he has missed some tube feeding due to scheduling especially on days when comes to the cancer center.      Medications: reviewed  Labs: K 2.9, glucose 251, BUN 25, creatinine  Anthropometrics:   Weight trending down to 171 lb 1.6 oz noted on 6/18 from 180 lb on 5/7 5% weight loss in little over 1 month.  Estimated Energy Needs  Kcals: 2000-2200 Protein: 95-110 g Fluid: > 2 L  NUTRITION DIAGNOSIS: Inadequate oral intake continues as relies on tube feeding    INTERVENTION:  Recommend trying jevity 1.5 vs 1.2.  Patient just received new shipment of jevity 1.2 and wants to still use this formula.  Will try 4 bottles of jevity 1.2 and 4 bottles of jevity 1.5 and monitor weight trends.   New tube feeding regimen will provide 2560 calories and 112 g protein and 2488-2940ml free water (if giving 2-3 16.9 oz bottles of water via tube).   Patient agreeable to trying new regimen.  RD will contact [facility].   Instructed patient to continue taking miralax for constipation and discuss with MD if not working.      MONITORING, EVALUATION, GOAL: Patient will continue to tolerate tube feeding to maintain weight    NEXT VISIT: phone f/u July 7  Lance Herring B. Zenia Resides, [place], [place] Registered Dietitian [phone] (pager)

## 2019-04-21 ENCOUNTER — Telehealth: Payer: Self-pay | Admitting: Oncology

## 2019-04-21 NOTE — Telephone Encounter (Signed)
Called and spoke with patient. Confirmed appt  °

## 2019-04-23 ENCOUNTER — Other Ambulatory Visit: Payer: Self-pay | Admitting: Oncology

## 2019-04-25 ENCOUNTER — Other Ambulatory Visit: Payer: Medicare Other

## 2019-04-25 ENCOUNTER — Ambulatory Visit: Payer: Medicare Other | Admitting: Oncology

## 2019-04-27 ENCOUNTER — Inpatient Hospital Stay (HOSPITAL_COMMUNITY)
Admission: AD | Admit: 2019-04-27 | Discharge: 2019-04-29 | DRG: 378 | Disposition: A | Discharge status: Home / Self Care | Payer: Medicare Other | Source: Ambulatory Visit | Attending: Internal Medicine | Admitting: Internal Medicine

## 2019-04-27 ENCOUNTER — Inpatient Hospital Stay: Payer: Medicare Other

## 2019-04-27 ENCOUNTER — Inpatient Hospital Stay: Payer: Medicare Other | Attending: Oncology | Admitting: Oncology

## 2019-04-27 ENCOUNTER — Encounter (HOSPITAL_COMMUNITY): Payer: Self-pay

## 2019-04-27 ENCOUNTER — Other Ambulatory Visit: Payer: Self-pay

## 2019-04-27 VITALS — BP 136/75 | HR 108 | Temp 98.4°F | Resp 20 | Ht 70.0 in | Wt 156.5 lb

## 2019-04-27 DIAGNOSIS — R1314 Dysphagia, pharyngoesophageal phase: Secondary | ICD-10-CM | POA: Diagnosis present

## 2019-04-27 DIAGNOSIS — C155 Malignant neoplasm of lower third of esophagus: Secondary | ICD-10-CM

## 2019-04-27 DIAGNOSIS — N179 Acute kidney failure, unspecified: Secondary | ICD-10-CM | POA: Diagnosis present

## 2019-04-27 DIAGNOSIS — N189 Chronic kidney disease, unspecified: Secondary | ICD-10-CM

## 2019-04-27 DIAGNOSIS — E872 Acidosis: Secondary | ICD-10-CM | POA: Diagnosis present

## 2019-04-27 DIAGNOSIS — D638 Anemia in other chronic diseases classified elsewhere: Secondary | ICD-10-CM | POA: Diagnosis present

## 2019-04-27 DIAGNOSIS — Z1159 Encounter for screening for other viral diseases: Secondary | ICD-10-CM | POA: Diagnosis not present

## 2019-04-27 DIAGNOSIS — D6481 Anemia due to antineoplastic chemotherapy: Secondary | ICD-10-CM

## 2019-04-27 DIAGNOSIS — K922 Gastrointestinal hemorrhage, unspecified: Secondary | ICD-10-CM | POA: Diagnosis present

## 2019-04-27 DIAGNOSIS — Z931 Gastrostomy status: Secondary | ICD-10-CM | POA: Diagnosis not present

## 2019-04-27 DIAGNOSIS — N183 Chronic kidney disease, stage 3 unspecified: Secondary | ICD-10-CM | POA: Diagnosis present

## 2019-04-27 DIAGNOSIS — Z95828 Presence of other vascular implants and grafts: Secondary | ICD-10-CM | POA: Diagnosis not present

## 2019-04-27 DIAGNOSIS — R531 Weakness: Secondary | ICD-10-CM

## 2019-04-27 DIAGNOSIS — Z79899 Other long term (current) drug therapy: Secondary | ICD-10-CM

## 2019-04-27 DIAGNOSIS — M109 Gout, unspecified: Secondary | ICD-10-CM | POA: Diagnosis present

## 2019-04-27 DIAGNOSIS — I10 Essential (primary) hypertension: Secondary | ICD-10-CM | POA: Diagnosis not present

## 2019-04-27 DIAGNOSIS — K921 Melena: Secondary | ICD-10-CM | POA: Diagnosis present

## 2019-04-27 DIAGNOSIS — D500 Iron deficiency anemia secondary to blood loss (chronic): Secondary | ICD-10-CM

## 2019-04-27 DIAGNOSIS — I129 Hypertensive chronic kidney disease with stage 1 through stage 4 chronic kidney disease, or unspecified chronic kidney disease: Secondary | ICD-10-CM | POA: Diagnosis present

## 2019-04-27 DIAGNOSIS — D62 Acute posthemorrhagic anemia: Secondary | ICD-10-CM | POA: Diagnosis present

## 2019-04-27 DIAGNOSIS — K3189 Other diseases of stomach and duodenum: Secondary | ICD-10-CM | POA: Diagnosis present

## 2019-04-27 DIAGNOSIS — E1122 Type 2 diabetes mellitus with diabetic chronic kidney disease: Secondary | ICD-10-CM | POA: Diagnosis present

## 2019-04-27 DIAGNOSIS — E876 Hypokalemia: Secondary | ICD-10-CM | POA: Diagnosis present

## 2019-04-27 DIAGNOSIS — K222 Esophageal obstruction: Secondary | ICD-10-CM | POA: Diagnosis present

## 2019-04-27 DIAGNOSIS — Z5112 Encounter for antineoplastic immunotherapy: Secondary | ICD-10-CM | POA: Insufficient documentation

## 2019-04-27 DIAGNOSIS — E860 Dehydration: Secondary | ICD-10-CM | POA: Diagnosis present

## 2019-04-27 DIAGNOSIS — Z833 Family history of diabetes mellitus: Secondary | ICD-10-CM | POA: Diagnosis not present

## 2019-04-27 DIAGNOSIS — E119 Type 2 diabetes mellitus without complications: Secondary | ICD-10-CM

## 2019-04-27 DIAGNOSIS — R634 Abnormal weight loss: Secondary | ICD-10-CM

## 2019-04-27 DIAGNOSIS — E1165 Type 2 diabetes mellitus with hyperglycemia: Secondary | ICD-10-CM

## 2019-04-27 DIAGNOSIS — E785 Hyperlipidemia, unspecified: Secondary | ICD-10-CM

## 2019-04-27 DIAGNOSIS — R627 Adult failure to thrive: Secondary | ICD-10-CM

## 2019-04-27 DIAGNOSIS — Z87891 Personal history of nicotine dependence: Secondary | ICD-10-CM

## 2019-04-27 DIAGNOSIS — R911 Solitary pulmonary nodule: Secondary | ICD-10-CM

## 2019-04-27 DIAGNOSIS — Z5111 Encounter for antineoplastic chemotherapy: Secondary | ICD-10-CM | POA: Insufficient documentation

## 2019-04-27 DIAGNOSIS — Z8249 Family history of ischemic heart disease and other diseases of the circulatory system: Secondary | ICD-10-CM | POA: Diagnosis not present

## 2019-04-27 DIAGNOSIS — C160 Malignant neoplasm of cardia: Secondary | ICD-10-CM

## 2019-04-27 DIAGNOSIS — J90 Pleural effusion, not elsewhere classified: Secondary | ICD-10-CM

## 2019-04-27 DIAGNOSIS — C159 Malignant neoplasm of esophagus, unspecified: Secondary | ICD-10-CM | POA: Diagnosis present

## 2019-04-27 DIAGNOSIS — Z8501 Personal history of malignant neoplasm of esophagus: Secondary | ICD-10-CM

## 2019-04-27 LAB — CBC WITH DIFFERENTIAL (CANCER CENTER ONLY)
Abs Immature Granulocytes: 0.2 10*3/uL — ABNORMAL HIGH (ref 0.00–0.07)
Band Neutrophils: 8 %
Basophils Absolute: 0 10*3/uL (ref 0.0–0.1)
Basophils Relative: 0 %
Eosinophils Absolute: 0 10*3/uL (ref 0.0–0.5)
Eosinophils Relative: 1 %
HCT: 24.2 % — ABNORMAL LOW (ref 39.0–52.0)
Hemoglobin: 7.7 g/dL — ABNORMAL LOW (ref 13.0–17.0)
Lymphocytes Relative: 21 %
Lymphs Abs: 0.9 10*3/uL (ref 0.7–4.0)
MCH: 30.1 pg (ref 26.0–34.0)
MCHC: 31.8 g/dL (ref 30.0–36.0)
MCV: 94.5 fL (ref 80.0–100.0)
Metamyelocytes Relative: 2 %
Monocytes Absolute: 1.1 10*3/uL — ABNORMAL HIGH (ref 0.1–1.0)
Monocytes Relative: 25 %
Myelocytes: 2 %
Neutro Abs: 2.1 10*3/uL (ref 1.7–17.7)
Neutrophils Relative %: 41 %
Platelet Count: 233 10*3/uL (ref 150–400)
RBC: 2.56 MIL/uL — ABNORMAL LOW (ref 4.22–5.81)
RDW: 18.6 % — ABNORMAL HIGH (ref 11.5–15.5)
WBC Count: 4.3 10*3/uL (ref 4.0–10.5)
nRBC: 2.1 % — ABNORMAL HIGH (ref 0.0–0.2)

## 2019-04-27 LAB — CMP (CANCER CENTER ONLY)
ALT: 29 U/L (ref 0–44)
AST: 30 U/L (ref 15–41)
Albumin: 2.7 g/dL — ABNORMAL LOW (ref 3.5–5.0)
Alkaline Phosphatase: 123 U/L (ref 38–126)
Anion gap: 15 (ref 5–15)
BUN: 58 mg/dL — ABNORMAL HIGH (ref 8–23)
CO2: 35 mmol/L — ABNORMAL HIGH (ref 22–32)
Calcium: 8.2 mg/dL — ABNORMAL LOW (ref 8.9–10.3)
Chloride: 90 mmol/L — ABNORMAL LOW (ref 98–111)
Creatinine: 1.95 mg/dL — ABNORMAL HIGH (ref 0.61–1.24)
GFR, Est AFR Am: 39 mL/min — ABNORMAL LOW (ref >60)
GFR, Estimated: 34 mL/min — ABNORMAL LOW (ref >60)
Glucose, Bld: 605 mg/dL (ref 70–99)
Potassium: 3 mmol/L — CL (ref 3.5–5.1)
Sodium: 140 mmol/L (ref 135–145)
Total Bilirubin: 0.6 mg/dL (ref 0.3–1.2)
Total Protein: 6.1 g/dL — ABNORMAL LOW (ref 6.5–8.1)

## 2019-04-27 LAB — URINALYSIS, ROUTINE W REFLEX MICROSCOPIC
Bacteria, UA: NONE SEEN
Bilirubin Urine: NEGATIVE
Glucose, UA: >=500 mg/dL — AB
Hgb urine dipstick: NEGATIVE
Ketones, ur: NEGATIVE mg/dL
Leukocytes,Ua: NEGATIVE
Nitrite: NEGATIVE
Protein, ur: NEGATIVE mg/dL
Specific Gravity, Urine: 1.022 (ref 1.005–1.030)
pH: 6 (ref 5.0–8.0)

## 2019-04-27 LAB — GLUCOSE, CAPILLARY
Glucose-Capillary: 575 mg/dL (ref 70–99)
Glucose-Capillary: 568 mg/dL (ref 70–99)
Glucose-Capillary: 318 mg/dL — ABNORMAL HIGH (ref 70–99)
Glucose-Capillary: 201 mg/dL — ABNORMAL HIGH (ref 70–99)
Glucose-Capillary: 153 mg/dL — ABNORMAL HIGH (ref 70–99)

## 2019-04-27 LAB — OCCULT BLOOD X 1 CARD TO LAB, STOOL: Fecal Occult Bld: POSITIVE — AB

## 2019-04-27 LAB — MRSA PCR SCREENING: MRSA by PCR: NEGATIVE

## 2019-04-27 LAB — SARS CORONAVIRUS 2 BY RT PCR (HOSPITAL ORDER, PERFORMED IN ~~LOC~~ HOSPITAL LAB): SARS Coronavirus 2: NEGATIVE

## 2019-04-27 LAB — MAGNESIUM: Magnesium: 2.2 mg/dL (ref 1.7–2.4)

## 2019-04-27 LAB — HEMOGLOBIN AND HEMATOCRIT, BLOOD
HCT: 22 % — ABNORMAL LOW (ref 39.0–52.0)
Hemoglobin: 6.9 g/dL — CL (ref 13.0–17.0)

## 2019-04-27 LAB — HEMOGLOBIN A1C
Hgb A1c MFr Bld: 10 % — ABNORMAL HIGH (ref 4.8–5.6)
Mean Plasma Glucose: 240.3 mg/dL

## 2019-04-27 LAB — PHOSPHORUS: Phosphorus: 3.4 mg/dL (ref 2.5–4.6)

## 2019-04-27 MED ORDER — TRAMADOL HCL 50 MG PO TABS: 50.0000 mg | ORAL_TABLET | Freq: Four times a day (QID) | ORAL | Status: DC | PRN

## 2019-04-27 MED ORDER — POTASSIUM CHLORIDE 20 MEQ PO PACK
20.0000 meq | PACK | Freq: Every day | ORAL | Status: DC
  Administered 2019-04-27: 20 meq
  Filled 2019-04-27 (×2): qty 1

## 2019-04-27 MED ORDER — INSULIN ASPART 100 UNIT/ML ~~LOC~~ SOLN: 10.0000 [IU] | Freq: Once | SUBCUTANEOUS | Status: DC

## 2019-04-27 MED ORDER — INSULIN ASPART 100 UNIT/ML ~~LOC~~ SOLN
0.0000 [IU] | SUBCUTANEOUS | Status: DC
  Administered 2019-04-27: 15 [IU] via SUBCUTANEOUS
  Administered 2019-04-27: 5 [IU] via SUBCUTANEOUS
  Administered 2019-04-27: 14:00:00 15 [IU] via SUBCUTANEOUS
  Administered 2019-04-27: 3 [IU] via SUBCUTANEOUS
  Administered 2019-04-28: 5 [IU] via SUBCUTANEOUS
  Administered 2019-04-28: 2 [IU] via SUBCUTANEOUS
  Administered 2019-04-28 – 2019-04-29 (×2): 5 [IU] via SUBCUTANEOUS

## 2019-04-27 MED ORDER — SODIUM CHLORIDE 0.9 % IV SOLN
80.0000 mg | Freq: Once | INTRAVENOUS | Status: AC
  Administered 2019-04-27: 80 mg via INTRAVENOUS
  Filled 2019-04-27: qty 80

## 2019-04-27 MED ORDER — SODIUM CHLORIDE 0.9 % IV BOLUS
1000.0000 mL | Freq: Once | INTRAVENOUS | Status: AC
  Administered 2019-04-27: 1000 mL via INTRAVENOUS

## 2019-04-27 MED ORDER — INSULIN GLARGINE 100 UNIT/ML ~~LOC~~ SOLN
15.0000 [IU] | Freq: Every day | SUBCUTANEOUS | Status: DC
  Administered 2019-04-27 – 2019-04-29 (×3): 15 [IU] via SUBCUTANEOUS
  Filled 2019-04-27 (×3): qty 0.15

## 2019-04-27 MED ORDER — SODIUM CHLORIDE 0.9 % IV SOLN
INTRAVENOUS | Status: DC
  Administered 2019-04-27 – 2019-04-28 (×2): via INTRAVENOUS

## 2019-04-27 MED ORDER — SODIUM CHLORIDE 0.9% IV SOLUTION
Freq: Once | INTRAVENOUS | Status: AC
  Administered 2019-04-28: 02:00:00 via INTRAVENOUS

## 2019-04-27 MED ORDER — ACETAMINOPHEN 650 MG RE SUPP: 650.0000 mg | Freq: Four times a day (QID) | RECTAL | Status: DC | PRN

## 2019-04-27 MED ORDER — ACETAMINOPHEN 325 MG PO TABS: 650.0000 mg | ORAL_TABLET | Freq: Four times a day (QID) | ORAL | Status: DC | PRN

## 2019-04-27 MED ORDER — ONDANSETRON HCL 4 MG PO TABS: 4.0000 mg | ORAL_TABLET | Freq: Four times a day (QID) | ORAL | Status: DC | PRN

## 2019-04-27 MED ORDER — SODIUM CHLORIDE 0.9 % IV SOLN
8.0000 mg/h | INTRAVENOUS | Status: DC
  Administered 2019-04-27 – 2019-04-28 (×2): 8 mg/h via INTRAVENOUS
  Filled 2019-04-27 (×4): qty 80

## 2019-04-27 MED ORDER — PROMETHAZINE HCL 6.25 MG/5ML PO SYRP
12.5000 mg | ORAL_SOLUTION | Freq: Four times a day (QID) | ORAL | Status: DC | PRN
  Filled 2019-04-27: qty 10

## 2019-04-27 MED ORDER — PANTOPRAZOLE SODIUM 40 MG IV SOLR: 40.0000 mg | Freq: Two times a day (BID) | INTRAVENOUS | Status: DC

## 2019-04-27 MED ORDER — ONDANSETRON HCL 4 MG/2ML IJ SOLN: 4.0000 mg | Freq: Four times a day (QID) | INTRAMUSCULAR | Status: DC | PRN

## 2019-04-27 MED ORDER — POTASSIUM CHLORIDE 20 MEQ/15ML (10%) PO SOLN
40.0000 meq | Freq: Two times a day (BID) | ORAL | Status: AC
  Administered 2019-04-27 (×2): 40 meq
  Filled 2019-04-27 (×2): qty 30

## 2019-04-27 MED ORDER — SODIUM CHLORIDE 0.9% FLUSH
10.0000 mL | INTRAVENOUS | Status: DC | PRN
  Administered 2019-04-27: 10 mL
  Filled 2019-04-27: qty 10

## 2019-04-27 MED ORDER — LATANOPROST 0.005 % OP SOLN
1.0000 [drp] | Freq: Every day | OPHTHALMIC | Status: DC
  Administered 2019-04-27 – 2019-04-28 (×2): 1 [drp] via OPHTHALMIC
  Filled 2019-04-27 (×2): qty 2.5

## 2019-04-27 MED ORDER — CHLORHEXIDINE GLUCONATE CLOTH 2 % EX PADS
6.0000 | MEDICATED_PAD | Freq: Every day | CUTANEOUS | Status: DC
  Administered 2019-04-28 – 2019-04-29 (×2): 6 via TOPICAL

## 2019-04-27 MED ORDER — JEVITY 1.2 CAL PO LIQD
474.0000 mL | Freq: Four times a day (QID) | ORAL | Status: DC
  Administered 2019-04-27 – 2019-04-28 (×5): 474 mL
  Administered 2019-04-29 (×2): 237 mL
  Filled 2019-04-27 (×10): qty 474

## 2019-04-27 MED ORDER — ATORVASTATIN CALCIUM 40 MG PO TABS
40.0000 mg | ORAL_TABLET | Freq: Every day | ORAL | Status: DC
  Administered 2019-04-27 – 2019-04-28 (×2): 40 mg via ORAL
  Filled 2019-04-27 (×2): qty 1

## 2019-04-27 MED ORDER — ALLOPURINOL 100 MG PO TABS
100.0000 mg | ORAL_TABLET | Freq: Every day | ORAL | Status: DC
  Administered 2019-04-28 – 2019-04-29 (×2): 100 mg via ORAL
  Filled 2019-04-27 (×2): qty 1

## 2019-04-27 NOTE — Patient Instructions (Signed)

## 2019-04-27 NOTE — Consult Note (Addendum)
Consultation  Referring Provider:  [facility]/ Dr Alfredia Ferguson Primary Care Physician:  [facility] Primary Gastroenterologist:  Dr.Jacobs  Reason for Consultation:   Anemia, melena  HPI: Lance Herring is a 73 y.o. male, known to Dr. Ardis Hughs, who was admitted today from the cancer center where he presented with progressive weakness, fatigue and shortness of breath.  He reported that he had been seen very dark stools over the past couple of days and has been having more bowel movements than usual for him.  He has had a couple of episodes of vomiting denies any hematemesis.  He has had mild epigastric discomfort, no change over his baseline. 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  He underwent radiation and chemotherapy, but unfortunately has had progression.  EGD in January 2020 showed an obstructing mass at 40 cm.  Biopsies were done and showed inflammation but no malignancy.  EGD repeated in February 2020 with a pinpoint stricture in the distal esophagus with the lumen measuring 1 mm the mucosa appeared to be consistent with exudative ulcer. PET scan in February 2020 showed bilateral pleural effusions and increased uptake just below the GE junction. He then had EGD via his gastrostomy tract with Dr. Ardis Hughs on 01/05/2019 that did confirm a mass at the gastric cardia/GE junction and biopsies confirmed adenocarcinoma.  He resumed chemotherapy with FOLFOX.  His last treatment was on 04/13/2019. Patient has been able to tolerate water and small amount of liquids p.o. and has been using the PEG tube regularly.  He generally takes 4 to 5 cans of Jevity 1.2/day. He is not on any aspirin or blood thinners. Most recent imaging was CT of the chest done on 03/30/2019 that showed a new cavitary change within a 1 cm apical left upper lobe pulmonary nodule stable in size, and stable nonspecific circumferential wall thickening in the lower thoracic esophagus  extending to the GE junction.  There is no new or progressive metastatic disease in the chest.  Labs today showed hemoglobin of 7.7, hematocrit 24.2, down from hemoglobin of 11.32 weeks ago. Glucose 605, creatinine 1.95, potassium of 3 has mild acidosis but gap is 15     Past Medical History:  Diagnosis Date  . Cataract   . CKD (chronic kidney disease) stage 3, GFR 30-59 ml/min (HCC)   . DM2 (diabetes mellitus, type 2) ([place])   . Esophageal cancer ([place])   . Glaucoma   . Gout   . HTN (hypertension)     Past Surgical History:  Procedure Laterality Date  . BIOPSY  01/05/2019   Procedure: BIOPSY;  Surgeon: Milus Banister, MD;  Location: [facility];  Service: Endoscopy;;  . ESOPHAGOGASTRODUODENOSCOPY (EGD) WITH PROPOFOL N/A 01/05/2019   Procedure: ESOPHAGOGASTRODUODENOSCOPY (EGD) WITH PROPOFOL;  Surgeon: Milus Banister, MD;  Location: [facility];  Service: Endoscopy;  Laterality: N/A;  . IR GASTROSTOMY TUBE MOD SED  11/22/2018  . ORCHIECTOMY Right    Benign    Prior to Admission medications   Medication Sig Start Date End Date Taking? Authorizing Provider  allopurinol (ZYLOPRIM) 100 MG tablet Take 100 mg by mouth daily.   Yes [provider]  amLODipine (NORVASC) 5 MG tablet Take 5 mg by mouth daily.   Yes [provider]  atorvastatin (LIPITOR) 40 MG tablet Take 40 mg by mouth at bedtime.  01/24/19  Yes [provider]  glimepiride (AMARYL) 4 MG tablet Take 4 mg by mouth daily. 01/02/19  Yes [provider]  lidocaine-prilocaine (EMLA) cream Apply 1 application topically as needed. Patient taking differently: Apply 1 application topically as needed (port).  01/12/19  Yes Ladell Pier, MD  Nutritional Supplements (FEEDING SUPPLEMENT, JEVITY 1.2 CAL,) LIQD Place 474 mLs into feeding tube 4 (four) times daily. Via peg tube   Yes [provider]  pioglitazone (ACTOS) 45 MG tablet Take 45 mg by mouth daily.   Yes [provider]   potassium chloride (KLOR-CON) 20 MEQ packet Place 20 mEq into feeding tube daily. 03/31/19  Yes Ladell Pier, MD  promethazine (PHENERGAN) 6.25 MG/5ML syrup Take 10 mLs (12.5 mg total) by mouth every 6 (six) hours as needed for nausea or vomiting. 01/12/19  Yes Ladell Pier, MD  Travoprost, BAK Free, (TRAVATAN) 0.004 % SOLN ophthalmic solution Place 1 drop into both eyes every evening.   Yes [provider]    Current Facility-Administered Medications  Medication Dose Route Frequency Provider Last Rate Last Dose  . 0.9 %  sodium chloride infusion   Intravenous Continuous Sheikh, Georgina Quint Latif, DO      . acetaminophen (TYLENOL) tablet 650 mg  650 mg Oral Q6H PRN Raiford Noble Latif, DO       Or  . acetaminophen (TYLENOL) suppository 650 mg  650 mg Rectal Q6H PRN Raiford Noble Latif, DO      . allopurinol (ZYLOPRIM) tablet 100 mg  100 mg Oral Daily Sheikh, Omair Latif, DO      . atorvastatin (LIPITOR) tablet 40 mg  40 mg Oral QHS Sheikh, Omair Latif, DO      . feeding supplement (JEVITY 1.2 CAL) liquid 474 mL  474 mL Per Tube QID Sheikh, Omair Latif, DO      . insulin aspart (novoLOG) injection 0-15 Units  0-15 Units Subcutaneous Q4H Sheikh, Omair Latif, DO      . insulin glargine (LANTUS) injection 15 Units  15 Units Subcutaneous Daily Sheikh, Omair Latif, DO      . latanoprost (XALATAN) 0.005 % ophthalmic solution 1 drop  1 drop Both Eyes QHS Sheikh, Omair Latif, DO      . ondansetron [facility]) tablet 4 mg  4 mg Oral Q6H PRN Sheikh, Omair Latif, DO       Or  . ondansetron [facility]) injection 4 mg  4 mg Intravenous Q6H PRN Sheikh, Omair Latif, DO      . pantoprazole (PROTONIX) 80 mg in sodium chloride 0.9 % 100 mL IVPB  80 mg Intravenous Once Sheikh, Omair Latif, DO      . pantoprazole (PROTONIX) 80 mg in sodium chloride 0.9 % 250 mL (0.32 mg/mL) infusion  8 mg/hr Intravenous Continuous Sheikh, Omair Latif, DO      . [START ON 05/01/2019] pantoprazole (PROTONIX) injection 40 mg  40 mg  Intravenous Q12H Sheikh, Omair Latif, DO      . potassium chloride (KLOR-CON) packet 20 mEq  20 mEq Per Tube Daily Sheikh, Omair Latif, DO      . potassium chloride 20 MEQ/15ML (10%) solution 40 mEq  40 mEq Per Tube BID Sheikh, Omair Latif, DO      . promethazine (PHENERGAN) 6.25 MG/5ML syrup 12.5 mg  12.5 mg Oral Q6H PRN Sheikh, Omair Latif, DO      . traMADol Veatrice Bourbon) tablet 50 mg  50 mg Oral Q6H PRN Raiford Noble [place], DO        Allergies as of 04/27/2019  . (No Known Allergies)    No family history on  file.  Social History   Socioeconomic History  . Marital status: Married    Spouse name: Altha Harm  . Number of children: Not on file  . Years of education: 57  . Highest education level: Not on file  Occupational History  . Occupation: Retired [employer] copy, [employer]  Social Needs  . Financial resource strain: Not very hard  . Food insecurity    Worry: Never true    Inability: Never true  . Transportation needs    Medical: No    Non-medical: No  Tobacco Use  . Smoking status: Former Smoker    Types: Cigarettes    Quit date: 1980    Years since quitting: 40.5  . Smokeless tobacco: Never Used  Substance and Sexual Activity  . Alcohol use: Never    Frequency: Never  . Drug use: Never  . Sexual activity: Not Currently    Partners: Female  Lifestyle  . Physical activity    Days per week: 0 days    Minutes per session: 0 min  . Stress: Not at all  Relationships  . Social Herbalist on phone: Three times a week    Gets together: Three times a week    Attends religious service: More than 4 times per year    Active member of club or organization: No    Attends meetings of clubs or organizations: Never    Relationship status: Married  . Intimate partner violence    Fear of current or ex partner: No    Emotionally abused: No    Physically abused: No    Forced sexual activity: No  Other Topics Concern  . Not on file  Social History  Narrative  . Not on file    Review of Systems: Pertinent positive and negative review of systems were noted in the above HPI section.  All other review of systems was otherwise negative.  Physical Exam: Vital signs in last 24 hours: Temp:  [98.4 F (36.9 C)] 98.4 F (36.9 C) (07/02 1044) Pulse Rate:  [108] 108 (07/02 1044) Resp:  [14-20] 14 (07/02 1300) BP: (124-136)/(75-76) 124/76 (07/02 1300) SpO2:  [97 %] 97 % (07/02 1300) Weight:  [71 kg] 71 kg (07/02 1044)   General:   Alert,  elderly African-American male, pleasant and cooperative in NAD.  Poor muscle mass Head:  Normocephalic and atraumatic. Eyes:  Sclera clear, no icterus.   Conjunctiva pink. Ears:  Normal auditory acuity. Nose:  No deformity, discharge,  or lesions. Mouth:  No deformity or lesions.   Neck:  Supple; no masses or thyromegaly. Lungs:  Clear throughout to auscultation.   No wheezes, crackles, or rhonchi.  Port-A-Cath in the right chest wall  Heart:  Regular rate and rhythm; no murmurs, clicks, rubs,  or gallops. Abdomen:  Soft,nontender, BS active,nonpalp mass or hsm.  PEG tube in the epigastrium this was aspirated with very dark blackish return of liquid.  (Attending addendum, I evaluated the PEG, it is in good position with good placement of external bumper allowing free movement of the G-tube) Rectal:  Deferred  Msk:  Symmetrical without gross deformities. . Pulses:  Normal pulses noted. Extremities:  Without clubbing or edema. Neurologic:  Alert and  oriented x4;  grossly normal neurologically. Skin:  Intact without significant lesions or rashes.. Psych:  Alert and cooperative. Normal mood and affect.  Intake/Output from previous day: No intake/output data recorded. Intake/Output this shift: No intake/output data recorded.  Lab  Results: Recent Labs    04/27/19 0953  WBC 4.3  HGB 7.7*  HCT 24.2*  PLT 233   BMET Recent Labs    04/27/19 0953  NA 140  K 3.0*  CL 90*  CO2 35*  GLUCOSE  605*  BUN 58*  CREATININE 1.95*  CALCIUM 8.2*   LFT Recent Labs    04/27/19 0953  PROT 6.1*  ALBUMIN 2.7*  AST 30  ALT 29  ALKPHOS 123  BILITOT 0.6   PT/INR No results for input(s): LABPROT, INR in the last 72 hours. Hepatitis Panel No results for input(s): HEPBSAG, HCVAB, HEPAIGM, HEPBIGM in the last 72 hours.    IMPRESSION:  #61  73 year old African-American male with adenocarcinoma of the gastric cardia/GE junction, obstructing, for which he is undergoing chemotherapy. Patient presents with 2 to 3-day history of melena, and progressive weakness and is found to have had a 3 g drop in hemoglobin. Aspirate from PEG tube is very dark/black.  Very likely he is has been oozing from the known gastric cardia tumor.  #2 he also had GI bleed in January 2020-and required transfusions #3 PEG tube in place-patient has been able to tolerate water and some liquids and has been using PEG tube for meds and Jevity 1.2 #4 anemia secondary to acute/subacute blood loss #5 onset diabetes mellitus-recent significant increase in hyperglycemia with glucose of above 600 today   Plan; Serial hemoglobins transfuse for hemoglobin less than 7 IV PPI twice daily Upper endoscopy can be considered, probably via the PEG track, though there is little to offer from an endoscopic standpoint for bleeding from malignancy. Consider CT of the abdomen and pelvis to evaluate for intra-abdominal progression. oK to use PEG tube for feedings, and okay for patient to have clear liquids as tolerated or comfort GI will follow   Amy EsterwoodPA-C  04/27/2019, 1:49 PM  I have reviewed the entire case in detail with the above APP and discussed the plan in detail.  Therefore, I agree with the diagnoses recorded above. In addition,  I have personally interviewed and examined the patient and have personally reviewed any abdominal/pelvic CT scan images.  My additional thoughts are as follows:  Unfortunate 73 year old man  with known EG junction adenocarcinoma and now with new onset diabetes with profound hyperglycemia as well as upper GI bleeding manifest as black stool, black aspirate from OG tube and decreasing hemoglobin.  He does not have pancytopenia to suggest a major contribution of chemotherapy induced anemia.  I agree that his bleeding seems most likely to be from the known EG junction cancer.  He could have an alternate source as a PEG related ulcer, though this seems less likely given that it is the type with internal balloon rather than mushroom bumper, and is in good position.  Conservative management appears warranted at this point.  Twice daily PPI, continue use of his G-tube nutrition, serial hemoglobin checks and we will follow with you.  I think the yield of what would be a challenging endoscopy through his G-tube stoma after removing the tube (such as was done by Dr. Ardis Hughs in March) would be of limited value since we are very unlikely to have endoscopic therapeutic intervention for hemostasis.  Nelida Meuse III [phone]

## 2019-04-27 NOTE — Progress Notes (Signed)
Inpatient Diabetes Program Recommendations  AACE/ADA: New Consensus Statement on Inpatient Glycemic Control (2015)  Target Ranges:  Prepandial:   less than 140 mg/dL      Peak postprandial:   less than 180 mg/dL (1-2 hours)      Critically ill patients:  140 - 180 mg/dL   Lab Results  Component Value Date   BRAXEN 407 (HH) 04/27/2019   HGBA1C 10.0 (H) 04/27/2019    Review of Glycemic Control Results for MUZAMMIL, BRUINS (MRN [id number]) as of 04/27/2019 15:47  Ref. Range 04/27/2019 13:44 04/27/2019 15:23  Glucose-Capillary Latest Ref Range: 70 - 99 mg/dL 575 (HH) 568 (HH)   Diabetes history: DM 2 Outpatient Diabetes medications:  Amaryl 4 mg daily, Actos 45 mg daily Current orders for Inpatient glycemic control:  Novolog moderate q 4 hours Lantus 15 units daily  Inpatient Diabetes Program Recommendations:    A1C indicates average blood sugar of 240 mg/dL.  He likely will need insulin at d/c.  Patient may have had steroids with chemotherapy which could contribute to elevated CBG's as well?  Will see patient on 7/3.   Thanks  Adah Perl, RN, BC-ADM Inpatient Diabetes Coordinator Pager [phone] (8a-5p)

## 2019-04-27 NOTE — Progress Notes (Signed)
CRITICAL VALUE ALERT  Critical Value:  Hemoglobin 6.9  Date & Time Notied:  04/27/2019 2242  Provider Notified: X. Blount  Orders Received/Actions taken: Awaiting any new orders

## 2019-04-27 NOTE — Patient Instructions (Signed)
For diarrhea: start Imodium 2 mg (take #2) four times daily as needed--this is over the counter med

## 2019-04-27 NOTE — Progress Notes (Signed)
[facility] OFFICE PROGRESS NOTE   Diagnosis: Gastroesophageal cancer  INTERVAL HISTORY:   Mr. Lance Herring completed another cycle of FOLFOX with Herceptin on 04/13/2019.  He reports a few episodes of nausea and vomiting following chemotherapy.  No nausea at present.  He has felt "weak" for the past few days.  He notes his blood sugar has been running in the 4-500 range for several days.  He has noted dark stools, but no gross blood. No fever or dyspnea. Objective:  Vital signs in last 24 hours:  Blood pressure 136/75, pulse (!) 108, temperature 98.4 F (36.9 C), temperature source Oral, resp. rate 20, height 5' 10"  (1.778 m), weight 156 lb 8 oz (71 kg), SpO2 97 %.    HEENT: Dried secretions, mild thrush, no ulcers Resp: Lungs clear bilaterally Cardio: Regular rate and rhythm, tachycardia GI: Gastrostomy tube feeding site without evidence of infection, no hepatomegaly, nontender Vascular: No leg edema Neuro: Alert and oriented Skin: Diminished skin turgor  Portacath/PICC-without erythema  Lab Results:  Lab Results  Component Value Date   WBC 4.3 04/27/2019   HGB 7.7 (L) 04/27/2019   HCT 24.2 (L) 04/27/2019   MCV 94.5 04/27/2019   PLT 233 04/27/2019   NEUTROABS 2.1 04/27/2019    CMP  Lab Results  Component Value Date   NA 140 04/27/2019   K 3.0 (LL) 04/27/2019   CL 90 (L) 04/27/2019   CO2 35 (H) 04/27/2019   GLUCOSE 605 (HH) 04/27/2019   BUN 58 (H) 04/27/2019   CREATININE 1.95 (H) 04/27/2019   CALCIUM 8.2 (L) 04/27/2019   PROT 6.1 (L) 04/27/2019   ALBUMIN 2.7 (L) 04/27/2019   AST 30 04/27/2019   ALT 29 04/27/2019   ALKPHOS 123 04/27/2019   BILITOT 0.6 04/27/2019   GFRNONAA 34 (L) 04/27/2019   GFRAA 39 (L) 04/27/2019    Lab Results  Component Value Date   CEA1 5.10 (H) 01/17/2019     Medications: I have reviewed the patient's current medications.   Assessment/Plan: 1. Adenocarcinoma of the distal esophagus/GE junction, clinical T2 versus  T3 N0 ? Partially obstructing esophagus mass 02/01/2017, biopsy confirmed moderately differentiated adenocarcinoma ? Staging CTs 03/06/2017-esophagus mass, single noncalcified pulmonary nodule the medial aspect of the left lower lobe, evidence of prior granulomatous disease, right adrenal adenoma ? PET scan 04/07/2017-hypermetabolic distal esophageal mass, no evidence of metastatic disease in the abdomen, pelvis, or skeleton. The right adrenal mass on CT was not hypermetabolic and favored to be an adenoma ? Radiation and concurrent weekly Taxol/carboplatin 04/20/2017-05/28/2017 ? CTs 07/05/2018-improvement in esophagus mass ? Endoscopy 07/26/2017-ulceration and friability from the GE junction up to 35 cm, 2 cm mass at the GE junction-improved in size, biopsy revealed no malignancy ? PET scan 08/19/2017-hypermetabolic activity at the distal esophagus and GE junction, no evidence of distant metastatic disease ? Admission to [facility] 11/18/2018 with a syncope event secondary to upper GI bleeding ? Upper endoscopy 11/18/2018-obstructing mass at 40 cm, biopsies revealed inflammation and no malignancy ? CTs 11/20/2018-10 mm nodule at the left lung apex, thickening at the distal esophagus, evidence of prior granulomatous disease with calcified right lung nodules and right paratracheal/hilar lymph nodes ? Upper endoscopy 12/07/2026- pinpoint stricture in the distal esophagus, lumen was 1 mm. Gastroesophageal mucosa with exudate consistent with ulcer. No malignancy.  ? PET scan 12/19/2018- focal area of increased uptake at or just below the level of the GE junction; moderate FDG uptake associated with left apical lung nodule. Bilateral pleural effusions  and lower lobe atelectasis. ? Endoscopy via gastrostomy tube tract 01/05/2019- mass at the gastric cardia/GE junction, biopsy confirmed adenocarcinoma, CPS-5,Her-2  3+ ? Cycle 1 FOLFOX 01/18/2019 ? Cycle 2 FOLFOX 02/02/2019 ? Cycle 3 FOLFOX 02/16/2019 ?  Cycle 4 FOLFOX 03/02/2019 ? Cycle 5 FOLFOX 03/16/2019 ? CT chest-new cavitary change in the 1 cm left upper lobe pulmonary nodule, stable in size.  Stable circumferential wall thickening of the lower thoracic esophagus/GE junction, no progressive disease ? Cycle 6 FOLFOX 03/30/2019, Herceptin added 2. Upper GI bleeding secondary to #1, 11/18/2018-transfused with a total of 4 units of packed red blood cells 3. Nutrition-placement of gastrostomy feeding tube 11/22/2018  Feeding tube placed initial treatment in 2018, removed May 2019  4.Diabetes 5.Hypertension 6.Gout  7.Chronic renal failure 8.   Admission 04/27/2019 with severe hyperglycemia, weight loss, and dehydration 9.   Anemia-acute drop in hemoglobin noted 04/27/2019     Disposition: Mr. Blais has completed 6 cycles of FOLFOX.  Herceptin was added with cycle 6.  He presents today with failure to thrive, dehydration, and marked hyperglycemia.  He has developed severe anemia.  I suspect the weight loss and dehydration are related to out-of-control diabetes.  The anemia is secondary to chemotherapy and GI bleeding.  I contacted Dr. Alfredia Ferguson  to arrange for hospital admission.  I appreciate the care from Dr. Alfredia Ferguson  Recommendations:  1.  Check stool Hemoccult, consider GI consultation to evaluate for GI bleeding 2.  Hydration, management of diabetes per the hospitalist service 3.  Chemotherapy will be held today and rescheduled for within the next few weeks. 4.  I will check on Mr. Grieser 04/28/2019  Betsy Coder, MD  04/27/2019  1:50 PM

## 2019-04-27 NOTE — Progress Notes (Signed)
Denies feeling lightheaded or short of breath. More fatigue and has had liquid stools a few days that look dark, "but not black". He reports his blood sugar at home has been running over 400 for the last several days. Came to office in W/C today-usually ambulates independently when he comes to office. Reports feeling cold and requested warm blankets. @ 1250: Transported patient to [address]-room # [id number] via w/c in good condition. Report given to [facility] [place], [place]. Wife notified of admission and rationale.

## 2019-04-27 NOTE — H&P (Signed)
History and Physical    Lance Herring ZOX:[id number] DOB: 07/01/1946 DOA: 04/27/2019  PCP: [facility]   Patient coming from: Lance Herring as Direct Admission  Chief Complaint: Hyperglycemia; Low Blood Count  HPI: Lance Herring is a 73 y.o. male with medical history significant for but not limited too well, cataracts, diabetes mellitus type 2, CKD stage III, history of gout, history of hypertension, history of esophageal cancer/cancer of the GE junction currently getting chemotherapy with FOLFOX, and other comorbidities who was directly admitted from the cancer center due to hyperglycemia and a low hemoglobin.  Patient endorses being weak for last 3 to 4 days and recently got chemotherapy on 04/13/2019.  States that he is vomited a few times since then.  Since a few days ago he has noted dark-colored stools and per report he has been drinking a lot of juices at home.  Gets PEG tube feedings however has been taking clear liquid diet for comfort recently.  Patient went to his primary oncologist today and reported feeling weak for last few days and blood work was obtained and it was found that his blood sugars were elevated.  Patient reports that his blood sugars have been running in the 4-500 range recently that he is noted dark stools but no gross blood noted.  Denies any shortness of breath but does feel weaker and more tired.  [facility] was asked to admit this patient for generalized weakness and symptomatic anemia along with elevated blood sugar in the setting of diabetes mellitus type 2.  ED Course: No ED course this patient was directly admitted from the cancer center but patient was seen by Dr. Benay Spice in the oncology office and please refer to his note.  Review of Systems: As per HPI otherwise 10 point review of systems negative.   Past Medical History:  Diagnosis Date  . Cataract   . CKD (chronic kidney disease) stage 3, GFR 30-59 ml/min (HCC)   . DM2 (diabetes mellitus,  type 2) ([place])   . Esophageal cancer ([place])   . Glaucoma   . Gout   . HTN (hypertension)    Past Surgical History:  Procedure Laterality Date  . BIOPSY  01/05/2019   Procedure: BIOPSY;  Surgeon: Milus Banister, MD;  Location: [facility];  Service: Endoscopy;;  . ESOPHAGOGASTRODUODENOSCOPY (EGD) WITH PROPOFOL N/A 01/05/2019   Procedure: ESOPHAGOGASTRODUODENOSCOPY (EGD) WITH PROPOFOL;  Surgeon: Milus Banister, MD;  Location: [facility];  Service: Endoscopy;  Laterality: N/A;  . IR GASTROSTOMY TUBE MOD SED  11/22/2018  . ORCHIECTOMY Right    Benign   SOCIAL HISTORY  reports that he quit smoking about 40 years ago. His smoking use included cigarettes. He has never used smokeless tobacco. He reports that he does not drink alcohol or use drugs.  ALLERGIES No Known Allergies   FAMILY HISTORY Dad had HTN and Diabetes, He cannot remember what his mom had   Prior to Admission medications   Medication Sig Start Date End Date Taking? Authorizing Provider  allopurinol (ZYLOPRIM) 100 MG tablet Take 100 mg by mouth daily.   Yes [provider]  amLODipine (NORVASC) 5 MG tablet Take 5 mg by mouth daily.   Yes [provider]  atorvastatin (LIPITOR) 40 MG tablet Take 40 mg by mouth at bedtime.  01/24/19  Yes [provider]  glimepiride (AMARYL) 4 MG tablet Take 4 mg by mouth daily. 01/02/19  Yes [provider]  lidocaine-prilocaine (EMLA) cream Apply 1 application topically  as needed. Patient taking differently: Apply 1 application topically as needed (port).  01/12/19  Yes Ladell Pier, MD  Nutritional Supplements (FEEDING SUPPLEMENT, JEVITY 1.2 CAL,) LIQD Place 474 mLs into feeding tube 4 (four) times daily. Via peg tube   Yes [provider]  pioglitazone (ACTOS) 45 MG tablet Take 45 mg by mouth daily.   Yes [provider]  potassium chloride (KLOR-CON) 20 MEQ packet Place 20 mEq into feeding tube daily. 03/31/19  Yes Ladell Pier, MD   promethazine (PHENERGAN) 6.25 MG/5ML syrup Take 10 mLs (12.5 mg total) by mouth every 6 (six) hours as needed for nausea or vomiting. 01/12/19  Yes Ladell Pier, MD  Travoprost, BAK Free, (TRAVATAN) 0.004 % SOLN ophthalmic solution Place 1 drop into both eyes every evening.   Yes [provider]   Physical Exam: Vitals:   04/27/19 1300 04/27/19 1400 04/27/19 1500  BP: 124/76 (!) 159/62 (!) 152/76  Pulse:   (!) 116  Resp: 14 14 (!) 24  SpO2: 97% 100% 100%   Constitutional: Elderly African-American male who is currently ill-appearing currently no acute distress NAD and appears calm but is a little uncomfortable Eyes: Lids and conjunctivae normal, sclerae anicteric  ENMT: External Ears, Nose appear normal. Grossly normal hearing. Neck: Appears normal, supple, no cervical masses, normal ROM, no appreciable thyromegaly Respiratory: Diminished to auscultation bilaterally, no wheezing, rales, rhonchi or crackles. Normal respiratory effort and patient is not tachypenic. No accessory muscle use. Cardiovascular: Tachycardic Rate, no murmurs / rubs / gallops. S1 and S2 auscultated. No extremity edema.  Abdomen: Soft, non-tender, Distended slightly. No masses palpated. No appreciable hepatosplenomegaly. Bowel sounds positive. Has a PEG in place GU: Deferred. Musculoskeletal: No clubbing / cyanosis of digits/nails. No joint deformity upper and lower extremities. Has a Port-A-Cath in place Skin: No rashes, lesions, ulcers on a limited skin eval. No induration; Warm and dry.  Neurologic: CN 2-12 grossly intact with no focal deficits.  Psychiatric: Normal judgment and insight. Alert and oriented x 3. Pleasant mood and appropriate affect.   Labs on Admission: I have personally reviewed following labs and imaging studies  CBC: Recent Labs  Lab 04/27/19 0953  WBC 4.3  NEUTROABS 2.1  HGB 7.7*  HCT 24.2*  MCV 94.5  PLT 353   Basic Metabolic Panel: Recent Labs  Lab 04/27/19 0953  04/27/19 1020  NA 140  --   K 3.0*  --   CL 90*  --   CO2 35*  --   GLUCOSE 605*  --   BUN 58*  --   CREATININE 1.95*  --   CALCIUM 8.2*  --   MG  --  2.2  PHOS  --  3.4   GFR: Estimated Creatinine Clearance: 34.9 mL/min (A) (by C-G formula based on SCr of 1.95 mg/dL (H)). Liver Function Tests: Recent Labs  Lab 04/27/19 0953  AST 30  ALT 29  ALKPHOS 123  BILITOT 0.6  PROT 6.1*  ALBUMIN 2.7*   No results for input(s): LIPASE, AMYLASE in the last 168 hours. No results for input(s): AMMONIA in the last 168 hours. Coagulation Profile: No results for input(s): INR, PROTIME in the last 168 hours. Cardiac Enzymes: No results for input(s): CKTOTAL, CKMB, CKMBINDEX, TROPONINI in the last 168 hours. BNP (last 3 results) No results for input(s): PROBNP in the last 8760 hours. HbA1C: Recent Labs    04/27/19 1020  HGBA1C 10.0*   CBG: Recent Labs  Lab 04/27/19 [place]  575*   Lipid Profile: No results for input(s): CHOL, HDL, LDLCALC, TRIG, CHOLHDL, LDLDIRECT in the last 72 hours. Thyroid Function Tests: No results for input(s): TSH, T4TOTAL, FREET4, T3FREE, THYROIDAB in the last 72 hours. Anemia Panel: No results for input(s): VITAMINB12, FOLATE, FERRITIN, TIBC, IRON, RETICCTPCT in the last 72 hours. Urine analysis:    Component Value Date/Time   COLORURINE STRAW (A) 04/27/2019 1325   APPEARANCEUR CLEAR 04/27/2019 1325   LABSPEC 1.022 04/27/2019 1325   PHURINE 6.0 04/27/2019 1325   GLUCOSEU >=500 (A) 04/27/2019 1325   HGBUR NEGATIVE 04/27/2019 1325   BILIRUBINUR NEGATIVE 04/27/2019 1325   KETONESUR NEGATIVE 04/27/2019 1325   PROTEINUR NEGATIVE 04/27/2019 1325   NITRITE NEGATIVE 04/27/2019 1325   LEUKOCYTESUR NEGATIVE 04/27/2019 1325   Sepsis Labs: !!!!!!!!!!!!!!!!!!!!!!!!!!!!!!!!!!!!!!!!!!!! @LABRCNTIP (procalcitonin:4,lacticidven:4) ) Recent Results (from the past 240 hour(s))  MRSA PCR Screening     Status: None   Collection Time: 04/27/19  1:43 PM    Specimen: Nasal Mucosa; Nasopharyngeal  Result Value Ref Range Status   MRSA by PCR NEGATIVE NEGATIVE Final    Comment:        The GeneXpert MRSA Assay (FDA approved for NASAL specimens only), is one component of a comprehensive MRSA colonization surveillance program. It is not intended to diagnose MRSA infection nor to guide or monitor treatment for MRSA infections. Performed at [facility], [place] [address], [place], [place] [address]     Radiological Exams on Admission: No results found.  EKG: No EKG done as he was directly admitted but will order and obtain one now.  Assessment/Plan Principal Problem:   UGIB (upper gastrointestinal bleed) Active Problems:   Acute blood loss anemia   Esophageal cancer (HCC)   DM2 (diabetes mellitus, type 2) (HCC)   HTN (hypertension)   CKD (chronic kidney disease) stage 3, GFR 30-59 ml/min (HCC)   Gastric mass   Port-A-Cath in place   Weakness   Hypokalemia   HLD (hyperlipidemia)  Generalized weakness and symptomatic anemia in the setting of acute blood loss anemia likely from an upper GI bleed associated with melena -Admit to the hospital to inpatient stepdown unit given his drop in hemoglobin -/Hematocrit went from 11.3/34.6 and is now 7.7/24.2 -In the setting of upper GI bleed from his known malignancy -Placed on Protonix drip and check FOBT -GI consulted for further evaluation recommendations -We will need PT and OT to evaluate and treat -Further care per GI and medical oncology -Check COVID test however patient has no symptoms and is low risk -Has been complaining of more generalized weakness and dark stools for last 3 days  Suspected upper GI Bleeding with Melena -As above -Gastroenterology consulted for further evaluation recommendations and they aspirated from his PEG tube and is very dark and black and they feel like he is losing from his known gastric cardia tumor -They are recommending serial  hemoglobins and transfusion for hemoglobin less than 7 continue IV PPI -They are considering upper endoscopy probably via PEG track there is no offer from an endoscopy standpoint for (from the standpoint.  -Also recommending considering a CT abdomen pelvis to evaluate for intra-abdominal progression and states that it is okay to use the PEG tube for feedings and okay for the patient to have clear liquids as tolerated for comfort -GI will continue to follow  Uncontrolled Hyperglycemia in the setting of diabetes mellitus type 2 -Does have a history of diabetes mellitus but blood sugars have been ranging very high and he has  been drinking juices at home -Blood sugar on admission was 605 and repeat CBG was 575 -We will place on Lantus 15 units daily and placed on a moderate alongside scale every 4 -We will consult diabetes education coordinator for further evaluation and recommendations and will check a hemoglobin A1c in the a.m. -Continue monitor and adjust insulin regimen as necessary -Currently holding his home medications with glimepiride 4 mg p.o. daily along with pioglitazone 45 mg p.o. daily  Hypokalemia -Patient's potassium this morning was 3.0 -Replete with p.o. potassium chloride 40 mEq twice daily to PEG tube -Continue monitor replete as necessary -Repeat CMP in AM   AKI on CKD stage III -Patient's BUN/creatinine is worsening and is now 58/1.95 and likely in the setting of GI bleeding and dehydration -Started gentle IV fluid hydration and normal saline at 75 mL/hr -Avoid nephrotoxic medications, contrast dyes, as well as hypotension -Repeat CMP in a.m.  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 Benay Spice of oncology will be following and see the patient tomorrow  Hyperlipidemia -Continue atorvastatin 40 mg p.o. nightly  History of Cataracts and Glaucoma -Continue travoprost substitution with latanoprost  Gout  -Continue Allopurinol  Hypertension -We will currently hold his amlodipine currently given concern for GI bleeding  DVT prophylaxis: SCDs Code Status: FULL CODE Family Communication: No family present at bedside  Disposition Plan: Pending further improvement and clearance by GI and medical oncology Consults called: Gastroenterology;  Admission status: Inpatient SDU  Severity of Illness: The appropriate patient status for this patient is INPATIENT. Inpatient status is judged to be reasonable and necessary in order to provide the required intensity of service to ensure the patient's safety. The patient's presenting symptoms, physical exam findings, and initial radiographic and laboratory data in the context of their chronic comorbidities is felt to place them at high risk for further clinical deterioration. Furthermore, it is not anticipated that the patient will be medically stable for discharge from the hospital within 2 midnights of admission. The following factors support the patient status of inpatient.   " The patient's presenting symptoms include Weakness, Dark Stools. " The worrisome physical exam findings include Dry MM. " The initial radiographic and laboratory data are worrisome because of Concern for GIB. " The chronic co-morbidities include as listed as above.  * I certify that at the point of admission it is my clinical judgment that the patient will require inpatient hospital care spanning beyond 2 midnights from the point of admission due to high intensity of service, high risk for further deterioration and high  frequency of surveillance required.Kerney Elbe, D.O. [facility] PAGER is on [place]  If 7PM-7AM, please contact night-coverage [email or web address] Password [facility]  04/27/2019, 3:18 PM

## 2019-04-28 ENCOUNTER — Encounter: Payer: Self-pay | Admitting: Nurse Practitioner

## 2019-04-28 DIAGNOSIS — K922 Gastrointestinal hemorrhage, unspecified: Secondary | ICD-10-CM

## 2019-04-28 LAB — CBC
HCT: 29.8 % — ABNORMAL LOW (ref 39.0–52.0)
Hemoglobin: 9.7 g/dL — ABNORMAL LOW (ref 13.0–17.0)
MCH: 30.7 pg (ref 26.0–34.0)
MCHC: 32.6 g/dL (ref 30.0–36.0)
MCV: 94.3 fL (ref 80.0–100.0)
Platelets: 179 10*3/uL (ref 150–400)
RBC: 3.16 MIL/uL — ABNORMAL LOW (ref 4.22–5.81)
RDW: 17.3 % — ABNORMAL HIGH (ref 11.5–15.5)
WBC: 4.6 10*3/uL (ref 4.0–10.5)
nRBC: 3 % — ABNORMAL HIGH (ref 0.0–0.2)

## 2019-04-28 LAB — GLUCOSE, CAPILLARY
Glucose-Capillary: 112 mg/dL — ABNORMAL HIGH (ref 70–99)
Glucose-Capillary: 121 mg/dL — ABNORMAL HIGH (ref 70–99)
Glucose-Capillary: 221 mg/dL — ABNORMAL HIGH (ref 70–99)
Glucose-Capillary: 223 mg/dL — ABNORMAL HIGH (ref 70–99)
Glucose-Capillary: 77 mg/dL (ref 70–99)
Glucose-Capillary: 86 mg/dL (ref 70–99)

## 2019-04-28 LAB — ABO/RH: ABO/RH(D): A NEG

## 2019-04-28 LAB — PREPARE RBC (CROSSMATCH)

## 2019-04-28 LAB — MAGNESIUM: Magnesium: 2.1 mg/dL (ref 1.7–2.4)

## 2019-04-28 LAB — BASIC METABOLIC PANEL
Anion gap: 6 (ref 5–15)
BUN: 36 mg/dL — ABNORMAL HIGH (ref 8–23)
CO2: 31 mmol/L (ref 22–32)
Calcium: 7.8 mg/dL — ABNORMAL LOW (ref 8.9–10.3)
Chloride: 106 mmol/L (ref 98–111)
Creatinine, Ser: 1.47 mg/dL — ABNORMAL HIGH (ref 0.61–1.24)
GFR calc Af Amer: 55 mL/min — ABNORMAL LOW (ref >60)
GFR calc non Af Amer: 47 mL/min — ABNORMAL LOW (ref >60)
Glucose, Bld: 91 mg/dL (ref 70–99)
Potassium: 2.8 mmol/L — ABNORMAL LOW (ref 3.5–5.1)
Sodium: 143 mmol/L (ref 135–145)

## 2019-04-28 LAB — HEMOGLOBIN AND HEMATOCRIT, BLOOD
HCT: 29.6 % — ABNORMAL LOW (ref 39.0–52.0)
HCT: 29 % — ABNORMAL LOW (ref 39.0–52.0)
Hemoglobin: 9.2 g/dL — ABNORMAL LOW (ref 13.0–17.0)
Hemoglobin: 8.9 g/dL — ABNORMAL LOW (ref 13.0–17.0)

## 2019-04-28 LAB — HEMOGLOBIN A1C
Hgb A1c MFr Bld: 8.6 % — ABNORMAL HIGH (ref 4.8–5.6)
Mean Plasma Glucose: 200.12 mg/dL

## 2019-04-28 LAB — TSH: TSH: 1.616 u[IU]/mL (ref 0.350–4.500)

## 2019-04-28 MED ORDER — POTASSIUM CHLORIDE 20 MEQ/15ML (10%) PO SOLN
40.0000 meq | Freq: Three times a day (TID) | ORAL | Status: AC
  Administered 2019-04-28 (×3): 40 meq
  Filled 2019-04-28 (×4): qty 30

## 2019-04-28 MED ORDER — SODIUM CHLORIDE 0.9% FLUSH
10.0000 mL | INTRAVENOUS | Status: DC | PRN
  Administered 2019-04-28 – 2019-04-29 (×2): 10 mL
  Filled 2019-04-28 (×2): qty 40

## 2019-04-28 MED ORDER — PANTOPRAZOLE SODIUM 40 MG IV SOLR
40.0000 mg | Freq: Two times a day (BID) | INTRAVENOUS | Status: DC
  Administered 2019-04-28 – 2019-04-29 (×3): 40 mg via INTRAVENOUS
  Filled 2019-04-28 (×3): qty 40

## 2019-04-28 NOTE — Progress Notes (Signed)
RN spoke to patients wife Altha Harm, she was updated on patient's condition and all questions were answered. Informed her RN to up date her as needed.

## 2019-04-28 NOTE — Evaluation (Signed)
Physical Therapy Evaluation Patient Details Name: Lance Herring MRN: [id number] DOB: 08/04/1946 Today's Date: 04/28/2019   History of Present Illness  This 73 year old man was a direct admit from the [facility].  He is getting chemo and has esophagus CA.  PMH:  cataracts, DM, CKD, gout    Clinical Impression  Pt presents with LE generalized weakness, increased time and effort to perform mobility tasks, mild balance deficits requiring UE support during ambulation, and decreased activity tolerance vs baseline. Pt to benefit from acute PT to address deficits. Pt ambulated hallway distance with IV pole for support, requiring min guard to supervision level of assist for safety. Pt reports he is close to baseline level of functioning, PT to continue to follow acutely to progress mobility as able and prevent further deconditioning. No PT follow up required at this time.     Follow Up Recommendations No PT follow up;Supervision for mobility/OOB    Equipment Recommendations  Cane    Recommendations for Other Services       Precautions / Restrictions Precautions Precautions: Fall Precaution Comments: PEG tube, port-a-cath Restrictions Weight Bearing Restrictions: No      Mobility  Bed Mobility Overal bed mobility: Modified Independent             General bed mobility comments: Increased time, use of HOB elevation and bedrails to perform.  Transfers Overall transfer level: Needs assistance Equipment used: None Transfers: Sit to/from Stand Sit to Stand: Supervision;From elevated surface         General transfer comment: supervision for safety, increased time to rise with reaching for IV pole for steadying once standing.  Ambulation/Gait Ambulation/Gait assistance: Supervision;Min guard;+2 safety/equipment Gait Distance (Feet): 200 Feet Assistive device: IV Pole Gait Pattern/deviations: Step-through pattern;Decreased stride length Gait velocity: decr   General Gait  Details: Supervision to min guard for safety, use of IV pole for steadying. verbal cuing for safe hallway navigation.  Stairs            Wheelchair Mobility    Modified Rankin (Stroke Patients Only)       Balance Overall balance assessment: Modified Independent                                           Pertinent Vitals/Pain Pain Assessment: No/denies pain    Home Living Family/patient expects to be discharged to:: Private residence Living Arrangements: Spouse/significant other Available Help at Discharge: Family Type of Home: House Home Access: Stairs to enter Entrance Stairs-Rails: None Technical brewer of Steps: 3 Home Layout: One level Home Equipment: Shower seat - built in      Prior Function Level of Independence: Independent         Comments: Pt reports increased time and effort to perform ADLs and mobility, but otherwise pt is able to everything for self.     Hand Dominance   Dominant Hand: Right    Extremity/Trunk Assessment   Upper Extremity Assessment Upper Extremity Assessment: Overall WFL for tasks assessed    Lower Extremity Assessment Lower Extremity Assessment: Generalized weakness    Cervical / Trunk Assessment Cervical / Trunk Assessment: Normal  Communication   Communication: No difficulties  Cognition Arousal/Alertness: Awake/alert Behavior During Therapy: WFL for tasks assessed/performed Overall Cognitive Status: Within Functional Limits for tasks assessed  General Comments      Exercises     Assessment/Plan    PT Assessment Patient needs continued PT services  PT Problem List Decreased strength;Decreased mobility;Decreased range of motion;Decreased activity tolerance;Decreased balance       PT Treatment Interventions DME instruction;Therapeutic activities;Gait training;Therapeutic exercise;Patient/family education;Balance training;Stair  training;Functional mobility training    PT Goals (Current goals can be found in the Care Plan section)  Acute Rehab PT Goals Patient Stated Goal: none stated PT Goal Formulation: With patient Time For Goal Achievement: 05/12/19 Potential to Achieve Goals: Good    Frequency Min 3X/week   Barriers to discharge        Co-evaluation PT/OT/SLP Co-Evaluation/Treatment: Yes Reason for Co-Treatment: To address functional/ADL transfers PT goals addressed during session: Mobility/safety with mobility OT goals addressed during session: ADL's and self-care       AM-PAC PT "6 Clicks" Mobility  Outcome Measure Help needed turning from your back to your side while in a flat bed without using bedrails?: None Help needed moving from lying on your back to sitting on the side of a flat bed without using bedrails?: None Help needed moving to and from a bed to a chair (including a wheelchair)?: A Little Help needed standing up from a chair using your arms (e.g., wheelchair or bedside chair)?: A Little Help needed to walk in hospital room?: A Little Help needed climbing 3-5 steps with a railing? : A Little 6 Click Score: 20    End of Session   Activity Tolerance: Patient tolerated treatment well Patient left: in chair;with call bell/phone within reach Nurse Communication: Mobility status PT Visit Diagnosis: Other abnormalities of gait and mobility (R26.89);Difficulty in walking, not elsewhere classified (R26.2)    Time: 2575-0518 PT Time Calculation (min) (ACUTE ONLY): 21 min   Charges:   PT Evaluation $PT Eval Low Complexity: 1 Low         Jasiah Elsen Conception Chancy, PT Acute Rehabilitation Services Pager [phone]  Office [phone]  Deeanna Beightol D Elonda Husky 04/28/2019, 12:44 PM

## 2019-04-28 NOTE — Evaluation (Signed)
Occupational Therapy Evaluation Patient Details Name: Lance Herring MRN: [id number] DOB: [birth year] Today's Date: 04/28/2019    History of Present Illness This 73 year old man was a direct admit from the [facility].  He is getting chemo and has esophagus CA.  PMH:  cataracts, DM, CKD, gout   Clinical Impression   Pt was admitted for the above.  He is mostly at a set up level for adls.  No further OT is needed at this time     Follow Up Recommendations  No OT follow up    Equipment Recommendations  None recommended by OT    Recommendations for Other Services       Precautions / Restrictions Precautions Precautions: Fall Restrictions Weight Bearing Restrictions: No      Mobility Bed Mobility Overal bed mobility: Independent                Transfers                 General transfer comment: supervision for sit to stand    Balance                                           ADL either performed or assessed with clinical judgement   ADL Overall ADL's : Needs assistance/impaired     Grooming: Modified independent   Upper Body Bathing: Set up   Lower Body Bathing: Set up   Upper Body Dressing : Set up   Lower Body Dressing: Set up   Toilet Transfer: Min guard(for safety; pt steady)   Toileting- Clothing Manipulation and Hygiene: Independent         General ADL Comments: Pt tolerated session well; no SOB/LOB     Vision         Perception     Praxis      Pertinent Vitals/Pain Pain Assessment: No/denies pain     Hand Dominance     Extremity/Trunk Assessment Upper Extremity Assessment Upper Extremity Assessment: Overall WFL for tasks assessed           Communication Communication Communication: No difficulties   Cognition Arousal/Alertness: Awake/alert Behavior During Therapy: WFL for tasks assessed/performed Overall Cognitive Status: Within Functional Limits for tasks assessed                                      General Comments       Exercises     Shoulder Instructions      Home Living Family/patient expects to be discharged to:: Private residence Living Arrangements: Spouse/significant other Available Help at Discharge: Family Type of Home: House Home Access: Stairs to enter Technical brewer of Steps: 3 Entrance Stairs-Rails: None Home Layout: One level     Bathroom Shower/Tub: Occupational psychologist: Standard     Home Equipment: Civil engineer, contracting - built in          Prior Functioning/Environment Level of Independence: Independent                 OT Problem List:        OT Treatment/Interventions:      OT Goals(Current goals can be found in the care plan section) Acute Rehab OT Goals Patient Stated Goal: none stated OT Goal Formulation: All assessment and education complete,  DC therapy  OT Frequency:     Barriers to D/C:            Co-evaluation PT/OT/SLP Co-Evaluation/Treatment: Yes Reason for Co-Treatment: To address functional/ADL transfers PT goals addressed during session: Mobility/safety with mobility OT goals addressed during session: ADL's and self-care      AM-PAC OT "6 Clicks" Daily Activity     Outcome Measure Help from another person eating meals?: None Help from another person taking care of personal grooming?: A Little Help from another person toileting, which includes using toliet, bedpan, or urinal?: A Little Help from another person bathing (including washing, rinsing, drying)?: A Little Help from another person to put on and taking off regular upper body clothing?: A Little Help from another person to put on and taking off regular lower body clothing?: A Little 6 Click Score: 19   End of Session    Activity Tolerance: Patient tolerated treatment well Patient left: in chair;with call bell/phone within reach  OT Visit Diagnosis: Muscle weakness (generalized) (M62.81)                 Time: 4975-3005 OT Time Calculation (min): 18 min Charges:  OT General Charges $OT Visit: 1 Visit  Lesle Chris, OTR/L Acute Rehabilitation Services [phone] [facility] pager [phone] office 04/28/2019  Helen 04/28/2019, 11:34 AM

## 2019-04-28 NOTE — Progress Notes (Signed)
Inpatient Diabetes Program Recommendations  AACE/ADA: New Consensus Statement on Inpatient Glycemic Control (2015)  Target Ranges:  Prepandial:   less than 140 mg/dL      Peak postprandial:   less than 180 mg/dL (1-2 hours)      Critically ill patients:  140 - 180 mg/dL   Lab Results  Component Value Date   GLUCAP 77 04/28/2019   HGBA1C 8.6 (H) 04/28/2019    Review of Glycemic Control Results for ASIAH, BEFORT (MRN [id number]) as of 04/28/2019 11:24  Ref. Range 04/27/2019 17:17 04/27/2019 19:57 04/27/2019 23:23 04/28/2019 03:55 04/28/2019 07:34  Glucose-Capillary Latest Ref Range: 70 - 99 mg/dL 318 (H) 201 (H) 153 (H) 86 77  Diabetes history: DM 2 Outpatient Diabetes medications:  Amaryl 4 mg daily, Actos 45 mg daily Current orders for Inpatient glycemic control:  Novolog moderate q 4 hours/Jevity 1.2-474 ml 4 times a day Lantus 15 units daily Inpatient Diabetes Program Recommendations:    Please reduce Novolog correction to tid with meals and HS.    Note that A1C=10% however HgB low so unsure of reliability of A1C.  Called and spoke with patient's wife and daughter by phone.  Per wife patient's blood sugars have been good (90-100's) until after the last 2 chemo treatments.  She performs PEG feedings and CBG checks.  We discussed increased blood sugars on admit and probable need for insulin at discharge.  Emailed links for insulin pen teaching to wife.  We also discussed goals for blood sugars (100-180 mg/dL) and asked her to check blood sugars prior to each PEG feeding.  We also discussed hypoglycemia of blood sugars < 70 mg/dL and signs and symptoms. Discussed treatment of hypoglycemia including 4 oz of juice by mouth or via PEG.   At d/c consider Lantus 12 units daily using Solostar pen [phone]) and insulin pen needles [phone]).   Explained to wife the importance of f/u with PCP.  Also instructed her to let PCP know if blood sugars > 200 or less than 70 mg/dL.    Thanks,  Adah Perl, RN, BC-ADM Inpatient Diabetes Coordinator Pager [phone] (8a-5p)

## 2019-04-28 NOTE — Progress Notes (Signed)
PROGRESS NOTE    Lance Herring  ZOX:[id number] DOB: Mar 03, 1946 DOA: 04/27/2019 PCP: Tish Frederickson, [facility]     Brief Narrative:  Lance Herring is a 73 y.o. male with medical history significant for but not limited too well, cataracts, diabetes mellitus type 2, CKD stage III, history of gout, history of hypertension, history of esophageal cancer/cancer of the GE junction currently getting chemotherapy with FOLFOX, and other comorbidities who was directly admitted from the cancer center due to hyperglycemia and a low hemoglobin.  Patient endorses being weak for last 3 to 4 days and recently got chemotherapy on 04/13/2019.  States that he is vomited a few times since then.  Since a few days ago he has noted dark-colored stools and per report he has been drinking a lot of juices at home.  Gets PEG tube feedings however has been taking clear liquid diet for comfort recently.  Patient went to his primary oncologist today and reported feeling weak for last few days and blood work was obtained and it was found that his blood sugars were elevated.  Patient reports that his blood sugars have been running in the 4-500 range recently that he is noted dark stools but no gross blood noted.  Denies any shortness of breath but does feel weaker and more tired.  TRH was asked to admit this patient for generalized weakness and symptomatic anemia along with elevated blood sugar in the setting of diabetes mellitus type 2.  Oncology and GI consulted.  New events last 24 hours / Subjective: Feeling well this morning without any complaints.  Denies any further episodes of bleeding that he is noticed.  Asking about food.  Denies any nausea or vomiting.  Assessment & Plan:   Principal Problem:   UGIB (upper gastrointestinal bleed) Active Problems:   Acute blood loss anemia   Esophageal cancer (HCC)   DM2 (diabetes mellitus, type 2) (HCC)   HTN (hypertension)   CKD (chronic kidney disease) stage 3, GFR 30-59  ml/min (HCC)   Gastric mass   Port-A-Cath in place   Weakness   Hypokalemia   HLD (hyperlipidemia)   Upper GI bleeding with melena, symptomatic anemia -In setting of GI bleed from his known adenocarcinoma of the esophagus  -Protonix IV twice daily  -Received 2 unit packed red blood cell -Appreciate oncology, GI -GI planning for conservative management for now -Continue to trend CBC, stable this morning hemoglobin 9.7  Uncontrolled diabetes type 2, with hyperglycemia -Blood sugar on admission 605 -Continue Lantus, sliding scale insulin -Hemoglobin A1c 8.6 -Holding home glimepiride, p.o. glitazone -Blood sugar much better today  Severe hypokalemia -Replace, trend  -Magnesium normal  CKD stage III -Baseline creatinine 1.5-1.9 -Stable   Adenocarcinoma of the esophagus -PEG placed, continue tube feed -Currently getting chemotherapy -Followed by Dr. Benay Spice   Hyperlipidemia -Continue atorvastatin    DVT prophylaxis: SCD Code Status: Full Family Communication: None Disposition Plan: Pending further stabilization of GI bleed, transfer to med surg today    Consultants:   Oncology  GI  Procedures:   None  Antimicrobials:  Anti-infectives (From admission, onward)   None        Objective: Vitals:   04/28/19 0422 04/28/19 0426 04/28/19 0616 04/28/19 0800  BP:  (!) 152/65 (!) 144/64   Pulse:  93 96   Resp:  13 18   Temp:  98.2 F (36.8 C) 98 F (36.7 C) 98.2 F (36.8 C)  TempSrc:  Oral Oral Oral  SpO2:  100% 98%  Weight: 70.5 kg     Height:        Intake/Output Summary (Last 24 hours) at 04/28/2019 1121 Last data filed at 04/28/2019 0626 Gross per 24 hour  Intake 4412.46 ml  Output 990 ml  Net 3422.46 ml   Filed Weights   04/27/19 1548 04/28/19 0422  Weight: 70.9 kg 70.5 kg    Examination:  General exam: Appears calm and comfortable  Respiratory system: Clear to auscultation. Respiratory effort normal. Cardiovascular system: S1 & S2  heard, RRR. No JVD, murmurs, rubs, gallops or clicks. No pedal edema. Gastrointestinal system: Abdomen is nondistended, soft and nontender. No organomegaly or masses felt. Normal bowel sounds heard. Central nervous system: Alert and oriented. No focal neurological deficits. Extremities: Symmetric 5 x 5 power. Skin: No rashes, lesions or ulcers Psychiatry: Judgement and insight appear normal. Mood & affect appropriate.   Data Reviewed: I have personally reviewed following labs and imaging studies  CBC: Recent Labs  Lab 04/27/19 0953 04/27/19 2224 04/28/19 0822  WBC 4.3  --  4.6  NEUTROABS 2.1  --   --   HGB 7.7* 6.9* 9.7*  HCT 24.2* 22.0* 29.8*  MCV 94.5  --  94.3  PLT 233  --  885   Basic Metabolic Panel: Recent Labs  Lab 04/27/19 0953 04/27/19 1020 04/28/19 0822  NA 140  --  143  K 3.0*  --  2.8*  CL 90*  --  106  CO2 35*  --  31  GLUCOSE 605*  --  91  BUN 58*  --  36*  CREATININE 1.95*  --  1.47*  CALCIUM 8.2*  --  7.8*  MG  --  2.2 2.1  PHOS  --  3.4  --    GFR: Estimated Creatinine Clearance: 46 mL/min (A) (by C-G formula based on SCr of 1.47 mg/dL (H)). Liver Function Tests: Recent Labs  Lab 04/27/19 0953  AST 30  ALT 29  ALKPHOS 123  BILITOT 0.6  PROT 6.1*  ALBUMIN 2.7*   No results for input(s): LIPASE, AMYLASE in the last 168 hours. No results for input(s): AMMONIA in the last 168 hours. Coagulation Profile: No results for input(s): INR, PROTIME in the last 168 hours. Cardiac Enzymes: No results for input(s): CKTOTAL, CKMB, CKMBINDEX, TROPONINI in the last 168 hours. BNP (last 3 results) No results for input(s): PROBNP in the last 8760 hours. HbA1C: Recent Labs    04/27/19 1020 04/28/19 0822  HGBA1C 10.0* 8.6*   CBG: Recent Labs  Lab 04/27/19 1717 04/27/19 1957 04/27/19 2323 04/28/19 0355 04/28/19 0734  GLUCAP 318* 201* 153* 86 77   Lipid Profile: No results for input(s): CHOL, HDL, LDLCALC, TRIG, CHOLHDL, LDLDIRECT in the last 72  hours. Thyroid Function Tests: Recent Labs    04/28/19 0822  TSH 1.616   Anemia Panel: No results for input(s): VITAMINB12, FOLATE, FERRITIN, TIBC, IRON, RETICCTPCT in the last 72 hours. Sepsis Labs: No results for input(s): PROCALCITON, LATICACIDVEN in the last 168 hours.  Recent Results (from the past 240 hour(s))  Urine culture     Status: None (Preliminary result)   Collection Time: 04/27/19  1:28 PM   Specimen: Urine, Random  Result Value Ref Range Status   Specimen Description   Final    URINE, RANDOM Performed at [facility] [address], [place], [place] [address]    Special Requests   Final    NONE Performed at [facility], [place] [address] [place],  [place] [address]    Culture   Final    CULTURE REINCUBATED FOR BETTER GROWTH Performed at [facility], [place] [address] [place], [place] [address]    Report Status PENDING  Incomplete  MRSA PCR Screening     Status: None   Collection Time: 04/27/19  1:43 PM   Specimen: Nasal Mucosa; Nasopharyngeal  Result Value Ref Range Status   MRSA by PCR NEGATIVE NEGATIVE Final    Comment:        The GeneXpert MRSA Assay (FDA approved for NASAL specimens only), is one component of a comprehensive MRSA colonization surveillance program. It is not intended to diagnose MRSA infection nor to guide or monitor treatment for MRSA infections. Performed at [facility], [place] [address], [place], [place] [address]   SARS Coronavirus 2 (CEPHEID - Performed in [facility] hospital lab), Hosp Order     Status: None   Collection Time: 04/27/19  2:59 PM   Specimen: Nasopharyngeal Swab  Result Value Ref Range Status   SARS Coronavirus 2 NEGATIVE NEGATIVE Final    Comment: (NOTE) If result is NEGATIVE SARS-CoV-2 target nucleic acids are NOT DETECTED. The SARS-CoV-2 RNA is generally detectable in upper and lower  respiratory specimens during the acute phase of infection.  The lowest  concentration of SARS-CoV-2 viral copies this assay can detect is 250  copies / mL. A negative result does not preclude SARS-CoV-2 infection  and should not be used as the sole basis for treatment or other  patient management decisions.  A negative result may occur with  improper specimen collection / handling, submission of specimen other  than nasopharyngeal swab, presence of viral mutation(s) within the  areas targeted by this assay, and inadequate number of viral copies  (<250 copies / mL). A negative result must be combined with clinical  observations, patient history, and epidemiological information. If result is POSITIVE SARS-CoV-2 target nucleic acids are DETECTED. The SARS-CoV-2 RNA is generally detectable in upper and lower  respiratory specimens dur ing the acute phase of infection.  Positive  results are indicative of active infection with SARS-CoV-2.  Clinical  correlation with patient history and other diagnostic information is  necessary to determine patient infection status.  Positive results do  not rule out bacterial infection or co-infection with other viruses. If result is PRESUMPTIVE POSTIVE SARS-CoV-2 nucleic acids MAY BE PRESENT.   A presumptive positive result was obtained on the submitted specimen  and confirmed on repeat testing.  While 2019 novel coronavirus  (SARS-CoV-2) nucleic acids may be present in the submitted sample  additional confirmatory testing may be necessary for epidemiological  and / or clinical management purposes  to differentiate between  SARS-CoV-2 and other Sarbecovirus currently known to infect humans.  If clinically indicated additional testing with an alternate test  methodology [phone]) is advised. The SARS-CoV-2 RNA is generally  detectable in upper and lower respiratory sp ecimens during the acute  phase of infection. The expected result is Negative. Fact Sheet for Patients:  StrictlyIdeas.no  Fact Sheet for Healthcare Providers: BankingDealers.co.za This test is not yet approved or cleared by the [place] FDA and has been authorized for detection and/or diagnosis of SARS-CoV-2 by FDA under an Emergency Use Authorization (EUA).  This EUA will remain in effect (meaning this test can be used) for the duration of the COVID-19 declaration under Section 564(b)(1) of the Act, 21 U.S.C. section 360bbb-3(b)(1), unless the authorization is terminated or revoked sooner. Performed at [facility]  [place] [address], [place], [place] [address]        Radiology Studies: No results found.    Scheduled Meds: . allopurinol  100 mg Oral Daily  . atorvastatin  40 mg Oral QHS  . Chlorhexidine Gluconate Cloth  6 each Topical Daily  . feeding supplement (JEVITY 1.2 CAL)  474 mL Per Tube QID  . insulin aspart  0-15 Units Subcutaneous Q4H  . insulin glargine  15 Units Subcutaneous Daily  . latanoprost  1 drop Both Eyes QHS  . [START ON 05/01/2019] pantoprazole  40 mg Intravenous Q12H  . potassium chloride  40 mEq Per Tube TID   Continuous Infusions: . sodium chloride 75 mL/hr at 04/28/19 0419  . pantoprozole (PROTONIX) infusion 8 mg/hr (04/28/19 0208)     LOS: 1 day    Time spent: 35 minutes   Dessa Phi, DO [facility] [email or web address] 04/28/2019, 11:21 AM

## 2019-04-28 NOTE — Progress Notes (Signed)
[facility] Progress Note  Chief Complaint: Upper GI bleeding  History:  Mr. Lazo feels better than yesterday after transfusion.  He denies abdominal pain, he has not vomited blood and does not believe he has black tarry stool since admission.  His tube feeds are apparently running without difficulty.  He has generalized weakness, denies chest pain dyspnea or dysuria   Objective:   Current Facility-Administered Medications:  .  acetaminophen (TYLENOL) tablet 650 mg, 650 mg, Oral, Q6H PRN **OR** acetaminophen (TYLENOL) suppository 650 mg, 650 mg, Rectal, Q6H PRN, Sheikh, Omair Latif, DO .  allopurinol (ZYLOPRIM) tablet 100 mg, 100 mg, Oral, Daily, Sheikh, Omair Latif, DO, 100 mg at 04/28/19 1148 .  atorvastatin (LIPITOR) tablet 40 mg, 40 mg, Oral, QHS, Raiford Noble [place], [place], 40 mg at 04/27/19 2158 .  Chlorhexidine Gluconate Cloth 2 % PADS 6 each, 6 each, Topical, Daily, Raiford Noble [place], [place], 6 each at 04/28/19 1152 .  feeding supplement (JEVITY 1.2 CAL) liquid 474 mL, 474 mL, Per Tube, QID, Sheikh, Omair Latif, DO, 474 mL at 04/28/19 1148 .  insulin aspart (novoLOG) injection 0-15 Units, 0-15 Units, Subcutaneous, Q4H, Raiford Noble [place], [place], 5 Units at 04/28/19 1257 .  insulin glargine (LANTUS) injection 15 Units, 15 Units, Subcutaneous, Daily, Raiford Noble [place], [place], 15 Units at 04/28/19 1148 .  latanoprost (XALATAN) 0.005 % ophthalmic solution 1 drop, 1 drop, Both Eyes, QHS, Sheikh, [place], [place], 1 drop at 04/27/19 2227 .  ondansetron (ZOFRAN) tablet 4 mg, 4 mg, Oral, Q6H PRN **OR** ondansetron (ZOFRAN) injection 4 mg, 4 mg, Intravenous, Q6H PRN, Sheikh, Omair Latif, DO .  pantoprazole (PROTONIX) injection 40 mg, 40 mg, Intravenous, Q12H, Dessa Phi, DO, 40 mg at 04/28/19 1155 .  potassium chloride 20 MEQ/15ML (10%) solution 40 mEq, 40 mEq, Per Tube, TID, Dessa Phi, DO, 40 mEq at 04/28/19 1148 .  promethazine (PHENERGAN) 6.25 MG/5ML syrup 12.5 mg, 12.5 mg, Oral, Q6H PRN,  Sheikh, Omair Latif, DO .  traMADol Veatrice Bourbon) tablet 50 mg, 50 mg, Oral, Q6H PRN, Raiford Noble [place], DO     Vital signs in last 24 hrs: Vitals:   04/28/19 0800 04/28/19 1245  BP: 132/66 124/65  Pulse: 94 (!) 105  Resp: 13 17  Temp: 98.2 F (36.8 C)   SpO2: 99% 92%    Intake/Output Summary (Last 24 hours) at 04/28/2019 1328 Last data filed at 04/28/2019 0800 Gross per 24 hour  Intake 4652.46 ml  Output 990 ml  Net 3662.46 ml     Physical Exam Chronically ill-appearing poor muscle mass  HEENT: sclera anicteric, oral mucosa without lesions  Neck: supple, no thyromegaly, JVD or lymphadenopathy  Cardiac: RRR without murmurs, S1S2 heard, no peripheral edema  Pulm: clear to auscultation bilaterally, normal RR and effort noted  Abdomen: soft, no tenderness, with active bowel sounds.  PEG site appears healthy, external bumper in good position.  Skin; warm and dry, no jaundice  Recent Labs:  CBC Latest Ref Rng & Units 04/28/2019 04/27/2019 04/27/2019  WBC 4.0 - 10.5 K/uL 4.6 - 4.3  Hemoglobin 13.0 - 17.0 g/dL 9.7(L) 6.9(LL) 7.7(L)  Hematocrit 39.0 - 52.0 % 29.8(L) 22.0(L) 24.2(L)  Platelets 150 - 400 K/uL 179 - 233    No results for input(s): INR in the last 168 hours. CMP Latest Ref Rng & Units 04/28/2019 04/27/2019 04/13/2019  Glucose 70 - 99 mg/dL 91 605(HH) 251(H)  BUN 8 - 23 mg/dL 36(H) 58(H) 27(H)  Creatinine 0.61 - 1.24 mg/dL 1.47(H) 1.95(H)  1.89(H)  Sodium 135 - 145 mmol/L 143 140 138  Potassium 3.5 - 5.1 mmol/L 2.8(L) 3.0(LL) 2.9(LL)  Chloride 98 - 111 mmol/L 106 90(L) 90(L)  CO2 22 - 32 mmol/L 31 35(H) 35(H)  Calcium 8.9 - 10.3 mg/dL 7.8(L) 8.2(L) 9.5  Total Protein 6.5 - 8.1 g/dL - 6.1(L) 6.8  Total Bilirubin 0.3 - 1.2 mg/dL - 0.6 0.9  Alkaline Phos 38 - 126 U/L - 123 149(H)  AST 15 - 41 U/L - 30 28  ALT 0 - 44 U/L - 29 28    @ASSESSMENTPLANBEGIN @ Assessment: Upper GI bleed with acute blood loss anemia on top of anemia of chronic disease 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    Plan: Monitor Hgb daily. Continue tube feeds  Will follow.  As noted in consult, endoscopy would likely be of little benefit with tumor bleeding. Continue protonix 40 mg IV twice daily  Total time 25 minutes Nelida Meuse III Office: [phone]

## 2019-04-28 NOTE — Progress Notes (Signed)
Initial Nutrition Assessment  INTERVENTION:   Continue home regimen: -Jevity 1.2, 474 ml QID via PEG -Free water flush with 240 ml BID  -Pt is able to take in some fluids PO to meet additional fluid needs -This regimen provides 2280 kcal, 105g protein and 2008 ml H2O.  NUTRITION DIAGNOSIS:   Increased nutrient needs related to cancer and cancer related treatments as evidenced by estimated needs.  GOAL:   Patient will meet greater than or equal to 90% of their needs  MONITOR:   PO intake, Labs, Weight trends, I & O's  REASON FOR ASSESSMENT:   Consult Enteral/tube feeding initiation and management, Assessment of nutrition requirement/status  ASSESSMENT:   73 y.o. male with medical history significant for but not limited too well, cataracts, diabetes mellitus type 2, CKD stage III, history of gout, history of hypertension, history of esophageal cancer/cancer of the GE junction currently getting chemotherapy with FOLFOX, and other comorbidities who was directly admitted from the cancer center due to hyperglycemia and a low hemoglobin.  Last chemotherapy: 6/18. PEG placed January 2020.  **RD working remotely**  Patient followed by [facility] RD, last follow-up was 6/23. At that time pt was tolerating 8 cartons of Jevity 1.2 daily and was advised to start incorporating Jevity 1.5 into regimen (4 cartons of Jev 1.2 and then 4 cartons of Jev 1.5). Pt is able to drink some liquids PO.  Cartons of Jevity 1.5 are not available on hospital formulary so will continue 8 cartons of Jevity 1.2 while admitted.    Per weight records, pt has lost 25 lbs since 5/7 (13% wt loss x 2 months, significant for time frame).   Medications: KCl TID Labs reviewed: CBGs: 77-86 Low K GFR: 55  NUTRITION - FOCUSED PHYSICAL EXAM:  Unable to perform- working remotely.  Diet Order:   Diet Order            Diet clear liquid Room service appropriate? Yes; Fluid consistency: Thin  Diet effective  now              EDUCATION NEEDS:   No education needs have been identified at this time  Skin:  Skin Assessment: Reviewed RN Assessment  Last BM:  7/2  Height:   Ht Readings from Last 1 Encounters:  04/27/19 5\' 10"  (1.778 m)    Weight:   Wt Readings from Last 1 Encounters:  04/28/19 70.5 kg    Ideal Body Weight:  75.5 kg  BMI:  Body mass index is 22.3 kg/m.  Estimated Nutritional Needs:   Kcal:  2100-2300  Protein:  105-115g  Fluid:  2.1L/day  Clayton Bibles, MS, RD, LDN [facility] Dietitian Pager: [phone] After Hours Pager: [phone]

## 2019-04-28 NOTE — Progress Notes (Signed)
IP PROGRESS NOTE  Subjective:   Lance Herring reports feeling better.  No bleeding.  No bowel movement.  Objective: Vital signs in last 24 hours: Blood pressure (!) 144/64, pulse 96, temperature 98 F (36.7 C), temperature source Oral, resp. rate 18, height _0  (1.778 m), weight 155 lb 6.8 oz (70.5 kg), SpO2 98 %.  Intake/Output from previous day: 07/02 0701 - 07/03 0700 In: 4412.5 [P.O.:320; I.V.:1530.7; Blood:671.7; NG/GT:742; IV Piggyback:1148.1] Out: 79 [Urine:990]  Physical Exam: Not performed today  Lab Results: Recent Labs    04/27/19 0953 04/27/19 2224 04/28/19 0822  WBC 4.3  --  4.6  HGB 7.7* 6.9* 9.7*  HCT 24.2* 22.0* 29.8*  PLT 233  --  179    BMET Recent Labs    04/27/19 0953 04/28/19 0822  NA 140 143  K 3.0* 2.8*  CL 90* 106  CO2 35* 31  GLUCOSE 605* 91  BUN 58* 36*  CREATININE 1.95* 1.47*  CALCIUM 8.2* 7.8*    Lab Results  Component Value Date   CEA1 5.10 (H) 01/17/2019    Studies/Results: No results found.  Medications: I have reviewed the patient's current medications.  Assessment/Plan: 1. 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 Partially obstructing esophagus mass 02/01/2017, biopsy confirmed moderately differentiated adenocarcinoma ? Staging CTs 03/06/2017-esophagus mass, single noncalcified pulmonary nodule the medial aspect of the left lower lobe, evidence of prior granulomatous disease, right adrenal adenoma ? PET scan 04/07/2017-hypermetabolic distal esophageal mass, no evidence of metastatic disease in the abdomen, pelvis, or skeleton. The right adrenal mass on CT was not hypermetabolic and favored to be an adenoma ? Radiation and concurrent weekly Taxol/carboplatin 04/20/2017-05/28/2017 ? CTs 07/05/2018-improvement in esophagus mass ? Endoscopy 07/26/2017-ulceration and friability from the GE junction up to 35 cm, 2 cm mass at the GE junction-improved in size, biopsy revealed no malignancy ? PET  scan 08/19/2017-hypermetabolic activity at the distal esophagus and GE junction, no evidence of distant metastatic disease ? Admission to [facility] 11/18/2018 with a syncope event secondary to upper GI bleeding ? Upper endoscopy 11/18/2018-obstructing mass at 40 cm, biopsies revealed inflammation and no malignancy ? CTs 11/20/2018-10 mm nodule at the left lung apex, thickening at the distal esophagus, evidence of prior granulomatous disease with calcified right lung nodules and right paratracheal/hilar lymph nodes ? Upper endoscopy 12/07/2026-pinpoint stricture in the distal esophagus, lumen was 1 mm. Gastroesophageal mucosa with exudate consistent with ulcer. No malignancy.  ? PET scan 12/19/2018-focal area of increased uptake at or just below the level of the GE junction; moderate FDG uptake associated with left apical lung nodule. Bilateral pleural effusions and lower lobe atelectasis. ? Endoscopy via gastrostomy tube tract 01/05/2019-mass at the gastric cardia/GE junction, biopsy confirmed adenocarcinoma, CPS-5,Her-2  3+ ? Cycle 1 FOLFOX 01/18/2019 ? Cycle 2 FOLFOX 02/02/2019 ? Cycle 3 FOLFOX 02/16/2019 ? Cycle 4 FOLFOX 03/02/2019 ? Cycle 5 FOLFOX 03/16/2019 ? CT chest-new cavitary change in the 1 cm left upper lobe pulmonary nodule, stable in size.  Stable circumferential wall thickening of the lower thoracic esophagus/GE junction, no progressive disease ? Cycle 6 FOLFOX 03/30/2019, Herceptin added 2. Upper GI bleeding secondary to #1, 11/18/2018-transfused with a total of 4 units of packed red blood cells 3. Nutrition-placement of gastrostomy feeding tube 11/22/2018  Feeding tube placed initial treatment in 2018, removed May 2019  4.Diabetes 5.Hypertension 6.Gout 7.Chronic renal failure 8.   Admission 04/27/2019 with severe hyperglycemia, weight loss, and dehydration 9.   Anemia-acute drop in  hemoglobin noted 04/27/2019, most likely related to bleeding from the  gastroesophageal tumor, status post 2 units of packed red blood cells on 04/28/2019   Lance Herring appears improved.  The hyperglycemia has resolved.  The hemoglobin responded appropriately to red cell transfusions.  Recommendations: 1.  Continue management of diabetes per internal medicine 2.  Monitor hemoglobin, GI following 3.  Please call oncology as needed, outpatient follow-up will be scheduled at the Cancer center    LOS: 1 day   Betsy Coder, MD   04/28/2019, 9:36 AM

## 2019-04-29 LAB — CBC
HCT: 27.8 % — ABNORMAL LOW (ref 39.0–52.0)
Hemoglobin: 8.8 g/dL — ABNORMAL LOW (ref 13.0–17.0)
MCH: 30 pg (ref 26.0–34.0)
MCHC: 31.7 g/dL (ref 30.0–36.0)
MCV: 94.9 fL (ref 80.0–100.0)
Platelets: 166 10*3/uL (ref 150–400)
RBC: 2.93 MIL/uL — ABNORMAL LOW (ref 4.22–5.81)
RDW: 18 % — ABNORMAL HIGH (ref 11.5–15.5)
WBC: 4.5 10*3/uL (ref 4.0–10.5)
nRBC: 2.2 % — ABNORMAL HIGH (ref 0.0–0.2)

## 2019-04-29 LAB — TYPE AND SCREEN
ABO/RH(D): A NEG
Antibody Screen: NEGATIVE
Unit division: 0
Unit division: 0

## 2019-04-29 LAB — BPAM RBC
Blood Product Expiration Date: 202007282359
Blood Product Expiration Date: 202007292359
ISSUE DATE / TIME: 202007030127
ISSUE DATE / TIME: 202007030359
Unit Type and Rh: 600
Unit Type and Rh: 600

## 2019-04-29 LAB — BASIC METABOLIC PANEL
Anion gap: 6 (ref 5–15)
BUN: 29 mg/dL — ABNORMAL HIGH (ref 8–23)
CO2: 28 mmol/L (ref 22–32)
Calcium: 7.9 mg/dL — ABNORMAL LOW (ref 8.9–10.3)
Chloride: 108 mmol/L (ref 98–111)
Creatinine, Ser: 1.6 mg/dL — ABNORMAL HIGH (ref 0.61–1.24)
GFR calc Af Amer: 49 mL/min — ABNORMAL LOW (ref >60)
GFR calc non Af Amer: 43 mL/min — ABNORMAL LOW (ref >60)
Glucose, Bld: 77 mg/dL (ref 70–99)
Potassium: 3.6 mmol/L (ref 3.5–5.1)
Sodium: 142 mmol/L (ref 135–145)

## 2019-04-29 LAB — GLUCOSE, CAPILLARY
Glucose-Capillary: 69 mg/dL — ABNORMAL LOW (ref 70–99)
Glucose-Capillary: 106 mg/dL — ABNORMAL HIGH (ref 70–99)
Glucose-Capillary: 228 mg/dL — ABNORMAL HIGH (ref 70–99)

## 2019-04-29 LAB — URINE CULTURE: Culture: 30000 — AB

## 2019-04-29 MED ORDER — LANTUS SOLOSTAR 100 UNIT/ML ~~LOC~~ SOPN: 12.0000 [IU] | PEN_INJECTOR | Freq: Every day | SUBCUTANEOUS | 2 refills | Status: DC

## 2019-04-29 MED ORDER — HEPARIN SOD (PORK) LOCK FLUSH 100 UNIT/ML IV SOLN
500.0000 [IU] | INTRAVENOUS | Status: AC | PRN
  Administered 2019-04-29: 500 [IU]

## 2019-04-29 MED ORDER — PANTOPRAZOLE SODIUM 40 MG PO PACK: 40.0000 mg | PACK | Freq: Two times a day (BID) | ORAL | 2 refills | Status: DC

## 2019-04-29 MED ORDER — INSULIN PEN NEEDLE 31G X 5 MM MISC: 0 refills | Status: DC

## 2019-04-29 NOTE — Progress Notes (Signed)
Patient was instructed and he demonstrated how to give himself insulin daily and how to check glucose. He also was told what to look out for with hypoglycemia and how to treat.

## 2019-04-29 NOTE — Plan of Care (Signed)

## 2019-04-29 NOTE — Discharge Summary (Signed)
Physician Discharge Summary  Lance Herring LKG:[id number] DOB: 12/29/1945 DOA: 04/27/2019  PCP: Tish Frederickson, [facility]  Admit date: 04/27/2019 Discharge date: 04/29/2019  Admitted From: Home Disposition:  Home  Recommendations for Outpatient Follow-up:  1. Follow up with PCP in 1 week 2. Follow up with Dr. Benay Spice  3. Please obtain CBC in 1 week   Discharge Condition: Stable CODE STATUS: Full   Brief/Interim Summary: Lance Tiberio Smithis a 73 y.o.malewith medical history significantfor but not limited toowell, cataracts, diabetes mellitus type 2, CKD stage III, history of gout, history of hypertension, history of esophageal cancer/cancer of the GE junction currently getting chemotherapy with FOLFOX, and other comorbidities who was directly admitted from the cancer center due to hyperglycemia and a low hemoglobin. Patient endorses being weak for last 3 to 4 days and recently got chemotherapy on 04/13/2019. States that he is vomited a few times since then. Since a few days ago he has noted dark-colored stools and per report he has been drinking a lot of juices at home. Gets PEG tube feedings however has been taking clear liquid diet for comfort recently. Patient went to his primary oncologist today and reported feeling weak for last few days and blood work was obtained and it was found that his blood sugars were elevated. Patient reports that his blood sugars have been running in the 4-500 range recently that he is noted dark stools but no gross blood noted. Denies any shortness of breath but does feel weaker and more tired. [facility] was asked to admit this patient for generalized weakness and symptomatic anemia along with elevated blood sugar in the setting of diabetes mellitus type 2.  Oncology and GI consulted.  Discharge Diagnoses:  Principal Problem:   UGIB (upper gastrointestinal bleed) Active Problems:   Acute blood loss anemia   Esophageal cancer (HCC)   DM2 (diabetes mellitus,  type 2) (HCC)   HTN (hypertension)   CKD (chronic kidney disease) stage 3, GFR 30-59 ml/min (HCC)   Gastric mass   Port-A-Cath in place   Weakness   Hypokalemia   HLD (hyperlipidemia)  Upper GI bleeding with melena, symptomatic anemia -In setting of GI bleed from his known adenocarcinoma of the esophagus  -Protonix twice daily  -Received 2 unit packed red blood cell -Appreciate oncology, GI -GI planning for conservative management for now -Continue to trend CBC, stable this morning hemoglobin 8.8   Uncontrolled diabetes type 2, with hyperglycemia -Blood sugar on admission 605 -Hemoglobin A1c 8.6 -Prescribed lantus and pen needles at discharge  Severe hypokalemia -Replaced, now normalized   CKD stage III -Baseline creatinine 1.5-1.9 -Stable   Adenocarcinoma of the esophagus -PEG placed, continue tube feed -Currently getting chemotherapy -Followed by Dr. Benay Spice   Hyperlipidemia -Continue atorvastatin  Discharge Instructions  Discharge Instructions    Call MD for:  difficulty breathing, headache or visual disturbances   Complete by: As directed    Call MD for:  extreme fatigue   Complete by: As directed    Call MD for:  persistant nausea and vomiting   Complete by: As directed    Call MD for:  severe uncontrolled pain   Complete by: As directed    Call MD for:  temperature >100.4   Complete by: As directed    Discharge instructions   Complete by: As directed    You were cared for by a hospitalist during your hospital stay. If you have any questions about your discharge medications or the care you received while you  were in the hospital after you are discharged, you can call the unit and ask to speak with the hospitalist on call if the hospitalist that took care of you is not available. Once you are discharged, your primary care physician will handle any further medical issues. Please note that NO REFILLS for any discharge medications will be authorized once you  are discharged, as it is imperative that you return to your primary care physician (or establish a relationship with a primary care physician if you do not have one) for your aftercare needs so that they can reassess your need for medications and monitor your lab values.   Increase activity slowly   Complete by: As directed      Allergies as of 04/29/2019   No Known Allergies     Medication List    TAKE these medications   allopurinol 100 MG tablet Commonly known as: ZYLOPRIM Take 100 mg by mouth daily.   amLODipine 5 MG tablet Commonly known as: NORVASC Take 5 mg by mouth daily.   atorvastatin 40 MG tablet Commonly known as: LIPITOR Take 40 mg by mouth at bedtime.   feeding supplement (JEVITY 1.2 CAL) Liqd Place 474 mLs into feeding tube 4 (four) times daily. Via peg tube   glimepiride 4 MG tablet Commonly known as: AMARYL Take 4 mg by mouth daily.   Insulin Pen Needle 31G X 5 MM Misc Use to administer lantus insulin once daily   Lantus SoloStar 100 UNIT/ML Solostar Pen Generic drug: Insulin Glargine Inject 12 Units into the skin daily.   lidocaine-prilocaine cream Commonly known as: EMLA Apply 1 application topically as needed. What changed: reasons to take this   pantoprazole sodium 40 mg/20 mL Pack Commonly known as: PROTONIX Place 20 mLs (40 mg total) into feeding tube 2 (two) times daily.   pioglitazone 45 MG tablet Commonly known as: ACTOS Take 45 mg by mouth daily.   potassium chloride 20 MEQ packet Commonly known as: KLOR-CON Place 20 mEq into feeding tube daily.   promethazine 6.25 MG/5ML syrup Commonly known as: PHENERGAN Take 10 mLs (12.5 mg total) by mouth every 6 (six) hours as needed for nausea or vomiting.   Travoprost (BAK Free) 0.004 % Soln ophthalmic solution Commonly known as: TRAVATAN Place 1 drop into both eyes every evening.      Follow-up Information    [facility] Follow up.   Contact information: [place] [address] [phone]        Ladell Pier, MD. Schedule an appointment as soon as possible for a visit in 1 week(s).   Specialty: Oncology Contact information: [place] [address] [phone]          No Known Allergies  Consultations:  Oncology  GI    Procedures/Studies: No results found.     Discharge Exam: Vitals:   04/29/19 0604 04/29/19 1344  BP: 124/74 121/69  Pulse: 89 85  Resp: 16 16  Temp: 97.7 F (36.5 C) 98.2 F (36.8 C)  SpO2: 100% 100%    General: Pt is alert, awake, not in acute distress Cardiovascular: RRR, S1/S2 +, no rubs, no gallops Respiratory: CTA bilaterally, no wheezing, no rhonchi Abdominal: Soft, NT, ND, bowel sounds + Extremities: no edema, no cyanosis    The results of significant diagnostics from this hospitalization (including imaging, microbiology, ancillary and laboratory) are listed below for reference.     Microbiology: Recent Results (from the past 240 hour(s))  Urine culture     Status: Abnormal   Collection Time: 04/27/19  1:28 PM   Specimen: Urine, Random  Result Value Ref Range Status   Specimen Description   Final    URINE, RANDOM Performed at [facility] [address] [place], [place] [address]    Special Requests   Final    NONE Performed at [facility], [place] [address], [place], [place] [address]    Culture 30,000 COLONIES/mL STAPHYLOCOCCUS EPIDERMIDIS (A)  Final   Report Status 04/29/2019 FINAL  Final   Organism ID, Bacteria STAPHYLOCOCCUS EPIDERMIDIS (A)  Final      Susceptibility   Staphylococcus epidermidis - MIC*    CIPROFLOXACIN <=0.5 SENSITIVE Sensitive     GENTAMICIN <=0.5 SENSITIVE Sensitive     NITROFURANTOIN <=16 SENSITIVE Sensitive     OXACILLIN >=4 RESISTANT Resistant     TETRACYCLINE >=16 RESISTANT Resistant     VANCOMYCIN 2 SENSITIVE Sensitive     TRIMETH/SULFA <=10 SENSITIVE Sensitive      CLINDAMYCIN <=0.25 SENSITIVE Sensitive     RIFAMPIN <=0.5 SENSITIVE Sensitive     Inducible Clindamycin NEGATIVE Sensitive     * 30,000 COLONIES/mL STAPHYLOCOCCUS EPIDERMIDIS  MRSA PCR Screening     Status: None   Collection Time: 04/27/19  1:43 PM   Specimen: Nasal Mucosa; Nasopharyngeal  Result Value Ref Range Status   MRSA by PCR NEGATIVE NEGATIVE Final    Comment:        The GeneXpert MRSA Assay (FDA approved for NASAL specimens only), is one component of a comprehensive MRSA colonization surveillance program. It is not intended to diagnose MRSA infection nor to guide or monitor treatment for MRSA infections. Performed at [facility], [place] [address], [place], [place] [address]   SARS Coronavirus 2 (CEPHEID - Performed in [place] hospital lab), Hosp Order     Status: None   Collection Time: 04/27/19  2:59 PM   Specimen: Nasopharyngeal Swab  Result Value Ref Range Status   SARS Coronavirus 2 NEGATIVE NEGATIVE Final    Comment: (NOTE) If result is NEGATIVE SARS-CoV-2 target nucleic acids are NOT DETECTED. The SARS-CoV-2 RNA is generally detectable in upper and lower  respiratory specimens during the acute phase of infection. The lowest  concentration of SARS-CoV-2 viral copies this assay can detect is 250  copies / mL. A negative result does not preclude SARS-CoV-2 infection  and should not be used as the sole basis for treatment or other  patient management decisions.  A negative result may occur with  improper specimen collection / handling, submission of specimen other  than nasopharyngeal swab, presence of viral mutation(s) within the  areas targeted by this assay, and inadequate number of viral copies  (<250 copies / mL). A negative result must be combined with clinical  observations, patient history, and epidemiological information. If result is POSITIVE SARS-CoV-2 target nucleic acids are DETECTED. The SARS-CoV-2 RNA is generally detectable in  upper and lower  respiratory specimens dur ing the acute phase of infection.  Positive  results are indicative of active infection with SARS-CoV-2.  Clinical  correlation with patient history and other diagnostic information is  necessary to determine patient infection status.  Positive results do  not rule out bacterial infection or co-infection with other viruses. If result is PRESUMPTIVE POSTIVE SARS-CoV-2 nucleic acids MAY BE PRESENT.   A presumptive positive result was obtained on the submitted specimen  and confirmed on repeat testing.  While 2019  novel coronavirus  (SARS-CoV-2) nucleic acids may be present in the submitted sample  additional confirmatory testing may be necessary for epidemiological  and / or clinical management purposes  to differentiate between  SARS-CoV-2 and other Sarbecovirus currently known to infect humans.  If clinically indicated additional testing with an alternate test  methodology [phone]) is advised. The SARS-CoV-2 RNA is generally  detectable in upper and lower respiratory sp ecimens during the acute  phase of infection. The expected result is Negative. Fact Sheet for Patients:  StrictlyIdeas.no Fact Sheet for Healthcare Providers: BankingDealers.co.za This test is not yet approved or cleared by the [place] FDA and has been authorized for detection and/or diagnosis of SARS-CoV-2 by FDA under an Emergency Use Authorization (EUA).  This EUA will remain in effect (meaning this test can be used) for the duration of the COVID-19 declaration under Section 564(b)(1) of the Act, 21 U.S.C. section 360bbb-3(b)(1), unless the authorization is terminated or revoked sooner. Performed at [facility], [place] [address] [place], [place] [address]      Labs: BNP (last 3 results) No results for input(s): BNP in the last 8760 hours. Basic Metabolic Panel: Recent Labs  Lab 04/27/19 0953  04/27/19 1020 04/28/19 0822 04/29/19 0505  NA 140  --  143 142  K 3.0*  --  2.8* 3.6  CL 90*  --  106 108  CO2 35*  --  31 28  GLUCOSE 605*  --  91 77  BUN 58*  --  36* 29*  CREATININE 1.95*  --  1.47* 1.60*  CALCIUM 8.2*  --  7.8* 7.9*  MG  --  2.2 2.1  --   PHOS  --  3.4  --   --    Liver Function Tests: Recent Labs  Lab 04/27/19 0953  AST 30  ALT 29  ALKPHOS 123  BILITOT 0.6  PROT 6.1*  ALBUMIN 2.7*   No results for input(s): LIPASE, AMYLASE in the last 168 hours. No results for input(s): AMMONIA in the last 168 hours. CBC: Recent Labs  Lab 04/27/19 0953 04/27/19 2224 04/28/19 0822 04/28/19 1443 04/28/19 2253 04/29/19 0505  WBC 4.3  --  4.6  --   --  4.5  NEUTROABS 2.1  --   --   --   --   --   HGB 7.7* 6.9* 9.7* 9.2* 8.9* 8.8*  HCT 24.2* 22.0* 29.8* 29.6* 29.0* 27.8*  MCV 94.5  --  94.3  --   --  94.9  PLT 233  --  179  --   --  166   Cardiac Enzymes: No results for input(s): CKTOTAL, CKMB, CKMBINDEX, TROPONINI in the last 168 hours. BNP: Invalid input(s): POCBNP CBG: Recent Labs  Lab 04/28/19 1953 04/28/19 2334 04/29/19 0344 04/29/19 0735 04/29/19 1109  GLUCAP 121* 112* 69* 106* 228*   D-Dimer No results for input(s): DDIMER in the last 72 hours. Hgb A1c Recent Labs    04/27/19 1020 04/28/19 0822  HGBA1C 10.0* 8.6*   Lipid Profile No results for input(s): CHOL, HDL, LDLCALC, TRIG, CHOLHDL, LDLDIRECT in the last 72 hours. Thyroid function studies Recent Labs    04/28/19 0822  TSH 1.616   Anemia work up No results for input(s): VITAMINB12, FOLATE, FERRITIN, TIBC, IRON, RETICCTPCT in the last 72 hours. Urinalysis    Component Value Date/Time   COLORURINE STRAW (A) 04/27/2019 1325   APPEARANCEUR CLEAR 04/27/2019 1325   LABSPEC 1.022 04/27/2019 1325   PHURINE 6.0 04/27/2019 1325  GLUCOSEU >=500 (A) 04/27/2019 1325   HGBUR NEGATIVE 04/27/2019 1325   BILIRUBINUR NEGATIVE 04/27/2019 1325   KETONESUR NEGATIVE 04/27/2019 1325    PROTEINUR NEGATIVE 04/27/2019 1325   NITRITE NEGATIVE 04/27/2019 1325   LEUKOCYTESUR NEGATIVE 04/27/2019 1325   Sepsis Labs Invalid input(s): PROCALCITONIN,  WBC,  LACTICIDVEN Microbiology Recent Results (from the past 240 hour(s))  Urine culture     Status: Abnormal   Collection Time: 04/27/19  1:28 PM   Specimen: Urine, Random  Result Value Ref Range Status   Specimen Description   Final    URINE, RANDOM Performed at [facility], [place] [address] [place], [place] [address]    Special Requests   Final    NONE Performed at [facility], [place] [address], [place], [place] [address]    Culture 30,000 COLONIES/mL STAPHYLOCOCCUS EPIDERMIDIS (A)  Final   Report Status 04/29/2019 FINAL  Final   Organism ID, Bacteria STAPHYLOCOCCUS EPIDERMIDIS (A)  Final      Susceptibility   Staphylococcus epidermidis - MIC*    CIPROFLOXACIN <=0.5 SENSITIVE Sensitive     GENTAMICIN <=0.5 SENSITIVE Sensitive     NITROFURANTOIN <=16 SENSITIVE Sensitive     OXACILLIN >=4 RESISTANT Resistant     TETRACYCLINE >=16 RESISTANT Resistant     VANCOMYCIN 2 SENSITIVE Sensitive     TRIMETH/SULFA <=10 SENSITIVE Sensitive     CLINDAMYCIN <=0.25 SENSITIVE Sensitive     RIFAMPIN <=0.5 SENSITIVE Sensitive     Inducible Clindamycin NEGATIVE Sensitive     * 30,000 COLONIES/mL STAPHYLOCOCCUS EPIDERMIDIS  MRSA PCR Screening     Status: None   Collection Time: 04/27/19  1:43 PM   Specimen: Nasal Mucosa; Nasopharyngeal  Result Value Ref Range Status   MRSA by PCR NEGATIVE NEGATIVE Final    Comment:        The GeneXpert MRSA Assay (FDA approved for NASAL specimens only), is one component of a comprehensive MRSA colonization surveillance program. It is not intended to diagnose MRSA infection nor to guide or monitor treatment for MRSA infections. Performed at [facility], [place] [address], [place], [place] [address]   SARS Coronavirus 2 (CEPHEID - Performed in  [place] hospital lab), Hosp Order     Status: None   Collection Time: 04/27/19  2:59 PM   Specimen: Nasopharyngeal Swab  Result Value Ref Range Status   SARS Coronavirus 2 NEGATIVE NEGATIVE Final    Comment: (NOTE) If result is NEGATIVE SARS-CoV-2 target nucleic acids are NOT DETECTED. The SARS-CoV-2 RNA is generally detectable in upper and lower  respiratory specimens during the acute phase of infection. The lowest  concentration of SARS-CoV-2 viral copies this assay can detect is 250  copies / mL. A negative result does not preclude SARS-CoV-2 infection  and should not be used as the sole basis for treatment or other  patient management decisions.  A negative result may occur with  improper specimen collection / handling, submission of specimen other  than nasopharyngeal swab, presence of viral mutation(s) within the  areas targeted by this assay, and inadequate number of viral copies  (<250 copies / mL). A negative result must be combined with clinical  observations, patient history, and epidemiological information. If result is POSITIVE SARS-CoV-2 target nucleic acids are DETECTED. The SARS-CoV-2 RNA is generally detectable in upper and lower  respiratory specimens dur ing the acute phase of infection.  Positive  results are indicative of active infection with SARS-CoV-2.  Clinical  correlation with  patient history and other diagnostic information is  necessary to determine patient infection status.  Positive results do  not rule out bacterial infection or co-infection with other viruses. If result is PRESUMPTIVE POSTIVE SARS-CoV-2 nucleic acids MAY BE PRESENT.   A presumptive positive result was obtained on the submitted specimen  and confirmed on repeat testing.  While 2019 novel coronavirus  (SARS-CoV-2) nucleic acids may be present in the submitted sample  additional confirmatory testing may be necessary for epidemiological  and / or clinical management purposes  to  differentiate between  SARS-CoV-2 and other Sarbecovirus currently known to infect humans.  If clinically indicated additional testing with an alternate test  methodology [phone]) is advised. The SARS-CoV-2 RNA is generally  detectable in upper and lower respiratory sp ecimens during the acute  phase of infection. The expected result is Negative. Fact Sheet for Patients:  StrictlyIdeas.no Fact Sheet for Healthcare Providers: BankingDealers.co.za This test is not yet approved or cleared by the [place] FDA and has been authorized for detection and/or diagnosis of SARS-CoV-2 by FDA under an Emergency Use Authorization (EUA).  This EUA will remain in effect (meaning this test can be used) for the duration of the COVID-19 declaration under Section 564(b)(1) of the Act, 21 U.S.C. section 360bbb-3(b)(1), unless the authorization is terminated or revoked sooner. Performed at [facility], [place] [address], [place], [place] [address]      Patient was seen and examined on the day of discharge and was found to be in stable condition. Time coordinating discharge: 35 minutes including assessment and coordination of care, as well as examination of the patient.   SIGNED:  Dessa Phi, DO [facility] [email or web address] 04/29/2019, 2:44 PM

## 2019-05-01 ENCOUNTER — Telehealth: Payer: Self-pay

## 2019-05-01 NOTE — Telephone Encounter (Signed)
Confirmed appt/verified info °

## 2019-05-02 ENCOUNTER — Inpatient Hospital Stay: Payer: Medicare Other

## 2019-05-02 NOTE — Progress Notes (Signed)
Nutrition Follow-up:  Patient with gastric mass/esophageal cancer.  Followed by Dr. Benay Spice and receiving folfox. Noted hospital admission with hyperglycemia.  Started on insulin.  Spoke with patient via phone today.  Patient reports that he never received jevity 1.5 and has continued to use jevity 1.2.  Reports using typically 6 cartons per day due to feeling full (2 carton, 2 carton, then 1 carton and 1 carton).  Reports that he is giving about 8 oz of water with each feeding.    Reports that he is eating broth (chicken and beef), jello, sherbet orally.    Reports that blood glucose has been 155-227 usually checks around 7:30am.    Reports constipation is some better.  Medications: amaryl, lantus, actos  Labs: 7/4 glucose 77, BUN 29, creatinine 1.60  Anthropometrics:    Weight 164 lb 7.4 oz on 7/4 (hospital discharge weight  171 lb on 6/18 trending down from 180 lb.    Noted per MD weight loss could be from elevated blood glucose  Estimated Energy Needs  Kcals: 2000-2200 Protein: 95-110 g Fluid: > 2 L  NUTRITION DIAGNOSIS: Inadequate oral intake continues as relies on tube feeding   INTERVENTION:  Communication received from Adah Perl, RN regarding hospital admission for elevated blood glucose and PCP questioning glucerna.   Discussed with patient and willing to try glucerna.   Recommend glucerna 1.5, 6 (2 carton, 2 cartons, 1 carton and 1 carton) cartons per day via PEG tube.  Will provide 2136 kcals, 117 g protein and 2085ml free water (including 8 oz water flush with each feeding) Will contact [facility] regarding new tube feeding formula.      MONITORING, EVALUATION, GOAL: Patient will continue to tolerate tube feeding to maintain weight.    NEXT VISIT: Wednesday, July 8  Kamoni Depree B. Zenia Resides, [place], [place] Registered Dietitian [phone] (pager)

## 2019-05-03 ENCOUNTER — Inpatient Hospital Stay: Payer: Medicare Other

## 2019-05-03 NOTE — Progress Notes (Signed)
Nutrition Follow-up:  RD working remotely.  Patient with gastric mass/esophageal cancer.  Followed by Dr. Benay Spice and receiving folfox.  Patient with recent hospitalization 7/2-04/29/2019 with hyperglycemia (blood glucose > 500).  Patient was started on insulin during admission.    See note from yesterday 7/7.  Medications: amaryl, lantus, actos  Labs: reviewed  Anthropometrics:   Weight 164 lb 7.4 oz on 7/4 (hospital admission weight)   Estimated Energy Needs  Kcals: 2000-2200 Protein: 95-110 g Fluid: > 2 L  NUTRITION DIAGNOSIS: Inadequate oral intake continues as most of nutrition coming from tube feeding    INTERVENTION:  Coordination of care with [place] representative, patient and Charlean Sanfilippo regarding new tube feeding orders for patient of glucerna 1.5 to help with better blood glucose control.  Will need to monitor kidney function, blood glucose, weight and oral intake with new formula.  Will make adjustments as needed.   New tube feeding will provide 2136 kcals, 117 g protein and 2041ml free water.   Discussed regimen with patient today and he verbalized understanding.   Will need to continue to eat and drink orally for additional hydration and calories. Patient reports he drinks additional fluid orally daily.  Contact information provided and encouraged patient to reach out to RD if does not receive new formula by 7/10.      MONITORING, EVALUATION, GOAL: Patient will continue to tolerate tube feeding for better blood glucose control and prevent further weight loss.   NEXT VISIT: phone f/u July 14  Emmalynn Pinkham B. Zenia Resides, [place], [place] Registered Dietitian [phone] (pager)

## 2019-05-05 ENCOUNTER — Other Ambulatory Visit: Payer: Self-pay

## 2019-05-05 MED ORDER — GLUCERNA 1.5 CAL PO LIQD: ORAL | 5 refills | Status: DC

## 2019-05-07 ENCOUNTER — Other Ambulatory Visit: Payer: Self-pay | Admitting: Oncology

## 2019-05-08 ENCOUNTER — Telehealth: Payer: Self-pay | Admitting: Oncology

## 2019-05-08 NOTE — Telephone Encounter (Signed)
Called and spoke with [place]. Confirmed patient will be at 7/14 appt

## 2019-05-09 ENCOUNTER — Inpatient Hospital Stay: Payer: Medicare Other

## 2019-05-09 ENCOUNTER — Other Ambulatory Visit: Payer: Self-pay

## 2019-05-09 ENCOUNTER — Ambulatory Visit: Payer: Medicare Other

## 2019-05-09 ENCOUNTER — Inpatient Hospital Stay (HOSPITAL_BASED_OUTPATIENT_CLINIC_OR_DEPARTMENT_OTHER): Payer: Medicare Other | Admitting: Oncology

## 2019-05-09 VITALS — BP 148/70 | HR 92 | Temp 98.9°F | Resp 18 | Ht 70.0 in | Wt 169.2 lb

## 2019-05-09 DIAGNOSIS — R911 Solitary pulmonary nodule: Secondary | ICD-10-CM | POA: Diagnosis not present

## 2019-05-09 DIAGNOSIS — J90 Pleural effusion, not elsewhere classified: Secondary | ICD-10-CM | POA: Diagnosis not present

## 2019-05-09 DIAGNOSIS — C155 Malignant neoplasm of lower third of esophagus: Secondary | ICD-10-CM

## 2019-05-09 DIAGNOSIS — N189 Chronic kidney disease, unspecified: Secondary | ICD-10-CM

## 2019-05-09 DIAGNOSIS — I129 Hypertensive chronic kidney disease with stage 1 through stage 4 chronic kidney disease, or unspecified chronic kidney disease: Secondary | ICD-10-CM

## 2019-05-09 DIAGNOSIS — D649 Anemia, unspecified: Secondary | ICD-10-CM

## 2019-05-09 DIAGNOSIS — Z5112 Encounter for antineoplastic immunotherapy: Secondary | ICD-10-CM | POA: Diagnosis present

## 2019-05-09 DIAGNOSIS — C160 Malignant neoplasm of cardia: Secondary | ICD-10-CM | POA: Diagnosis not present

## 2019-05-09 DIAGNOSIS — E1122 Type 2 diabetes mellitus with diabetic chronic kidney disease: Secondary | ICD-10-CM

## 2019-05-09 DIAGNOSIS — Z95828 Presence of other vascular implants and grafts: Secondary | ICD-10-CM

## 2019-05-09 DIAGNOSIS — Z5111 Encounter for antineoplastic chemotherapy: Secondary | ICD-10-CM | POA: Diagnosis present

## 2019-05-09 LAB — CBC WITH DIFFERENTIAL (CANCER CENTER ONLY)
Abs Immature Granulocytes: 0.03 10*3/uL (ref 0.00–0.07)
Basophils Absolute: 0 10*3/uL (ref 0.0–0.1)
Basophils Relative: 0 %
Eosinophils Absolute: 0.1 10*3/uL (ref 0.0–0.5)
Eosinophils Relative: 1 %
HCT: 30.2 % — ABNORMAL LOW (ref 39.0–52.0)
Hemoglobin: 9.7 g/dL — ABNORMAL LOW (ref 13.0–17.0)
Immature Granulocytes: 0 %
Lymphocytes Relative: 9 %
Lymphs Abs: 0.9 10*3/uL (ref 0.7–4.0)
MCH: 29.8 pg (ref 26.0–34.0)
MCHC: 32.1 g/dL (ref 30.0–36.0)
MCV: 92.9 fL (ref 80.0–100.0)
Monocytes Absolute: 1.3 10*3/uL — ABNORMAL HIGH (ref 0.1–1.0)
Monocytes Relative: 13 %
Neutro Abs: 7.9 10*3/uL — ABNORMAL HIGH (ref 1.7–7.7)
Neutrophils Relative %: 77 %
Platelet Count: 261 10*3/uL (ref 150–400)
RBC: 3.25 MIL/uL — ABNORMAL LOW (ref 4.22–5.81)
RDW: 17.3 % — ABNORMAL HIGH (ref 11.5–15.5)
WBC Count: 10.2 10*3/uL (ref 4.0–10.5)
nRBC: 0 % (ref 0.0–0.2)

## 2019-05-09 LAB — URINALYSIS, COMPLETE (UACMP) WITH MICROSCOPIC
Bilirubin Urine: NEGATIVE
Glucose, UA: 50 mg/dL — AB
Hgb urine dipstick: NEGATIVE
Ketones, ur: NEGATIVE mg/dL
Leukocytes,Ua: NEGATIVE
Nitrite: NEGATIVE
Protein, ur: 100 mg/dL — AB
Specific Gravity, Urine: 1.024 (ref 1.005–1.030)
pH: 5 (ref 5.0–8.0)

## 2019-05-09 LAB — CMP (CANCER CENTER ONLY)
ALT: 20 U/L (ref 0–44)
AST: 20 U/L (ref 15–41)
Albumin: 2.5 g/dL — ABNORMAL LOW (ref 3.5–5.0)
Alkaline Phosphatase: 158 U/L — ABNORMAL HIGH (ref 38–126)
Anion gap: 8 (ref 5–15)
BUN: 14 mg/dL (ref 8–23)
CO2: 28 mmol/L (ref 22–32)
Calcium: 8.5 mg/dL — ABNORMAL LOW (ref 8.9–10.3)
Chloride: 101 mmol/L (ref 98–111)
Creatinine: 1.29 mg/dL — ABNORMAL HIGH (ref 0.61–1.24)
GFR, Est AFR Am: >60 mL/min (ref >60)
GFR, Estimated: 55 mL/min — ABNORMAL LOW (ref >60)
Glucose, Bld: 246 mg/dL — ABNORMAL HIGH (ref 70–99)
Potassium: 3.4 mmol/L — ABNORMAL LOW (ref 3.5–5.1)
Sodium: 137 mmol/L (ref 135–145)
Total Bilirubin: 0.6 mg/dL (ref 0.3–1.2)
Total Protein: 6.2 g/dL — ABNORMAL LOW (ref 6.5–8.1)

## 2019-05-09 MED ORDER — DEXAMETHASONE SODIUM PHOSPHATE 10 MG/ML IJ SOLN
INTRAMUSCULAR | Status: AC
  Filled 2019-05-09: qty 1

## 2019-05-09 MED ORDER — OXALIPLATIN CHEMO INJECTION 100 MG/20ML
85.0000 mg/m2 | Freq: Once | INTRAVENOUS | Status: AC
  Administered 2019-05-09: 165 mg via INTRAVENOUS
  Filled 2019-05-09: qty 33

## 2019-05-09 MED ORDER — TRASTUZUMAB CHEMO 150 MG IV SOLR
300.0000 mg | Freq: Once | INTRAVENOUS | Status: AC
  Administered 2019-05-09: 300 mg via INTRAVENOUS
  Filled 2019-05-09: qty 14.29

## 2019-05-09 MED ORDER — DEXAMETHASONE SODIUM PHOSPHATE 10 MG/ML IJ SOLN
10.0000 mg | Freq: Once | INTRAMUSCULAR | Status: AC
  Administered 2019-05-09: 11:00:00 10 mg via INTRAVENOUS

## 2019-05-09 MED ORDER — SODIUM CHLORIDE 0.9 % IV SOLN
2400.0000 mg/m2 | INTRAVENOUS | Status: DC
  Administered 2019-05-09: 15:00:00 4750 mg via INTRAVENOUS
  Filled 2019-05-09: qty 95

## 2019-05-09 MED ORDER — DEXTROSE 5 % IV SOLN
Freq: Once | INTRAVENOUS | Status: AC
  Administered 2019-05-09: 11:00:00 via INTRAVENOUS
  Filled 2019-05-09: qty 250

## 2019-05-09 MED ORDER — ACETAMINOPHEN 160 MG/5ML PO SOLN
650.0000 mg | Freq: Once | ORAL | Status: AC
  Administered 2019-05-09: 650 mg via ORAL

## 2019-05-09 MED ORDER — PALONOSETRON HCL INJECTION 0.25 MG/5ML
0.2500 mg | Freq: Once | INTRAVENOUS | Status: AC
  Administered 2019-05-09: 11:00:00 0.25 mg via INTRAVENOUS

## 2019-05-09 MED ORDER — DEXTROSE 5 % IV SOLN
Freq: Once | INTRAVENOUS | Status: AC
  Filled 2019-05-09: qty 250

## 2019-05-09 MED ORDER — SODIUM CHLORIDE 0.9 % IV SOLN
Freq: Once | INTRAVENOUS | Status: AC
  Administered 2019-05-09: 12:00:00 via INTRAVENOUS
  Filled 2019-05-09: qty 250

## 2019-05-09 MED ORDER — DIPHENHYDRAMINE HCL 12.5 MG/5ML PO ELIX
25.0000 mg | ORAL_SOLUTION | Freq: Once | ORAL | Status: AC
  Administered 2019-05-09: 11:00:00 25 mg via ORAL

## 2019-05-09 MED ORDER — SODIUM CHLORIDE 0.9% FLUSH
10.0000 mL | INTRAVENOUS | Status: DC | PRN
  Administered 2019-05-09: 10 mL
  Filled 2019-05-09: qty 10

## 2019-05-09 MED ORDER — ACETAMINOPHEN 160 MG/5ML PO SOLN
ORAL | Status: AC
  Filled 2019-05-09: qty 20.3

## 2019-05-09 MED ORDER — LEUCOVORIN CALCIUM INJECTION 350 MG
400.0000 mg/m2 | Freq: Once | INTRAVENOUS | Status: AC
  Administered 2019-05-09: 788 mg via INTRAVENOUS
  Filled 2019-05-09: qty 39.4

## 2019-05-09 MED ORDER — PALONOSETRON HCL INJECTION 0.25 MG/5ML
INTRAVENOUS | Status: AC
  Filled 2019-05-09: qty 5

## 2019-05-09 MED ORDER — FLUOROURACIL CHEMO INJECTION 2.5 GM/50ML
400.0000 mg/m2 | Freq: Once | INTRAVENOUS | Status: AC
  Administered 2019-05-09: 800 mg via INTRAVENOUS
  Filled 2019-05-09: qty 16

## 2019-05-09 NOTE — Progress Notes (Signed)
Per Dr. Benay Spice: pt not due for echo again until September 2020 since pt started Herceptin in June 2020. Relayed information to pharmacy team who put a note in treatment plan to reflect.

## 2019-05-09 NOTE — Progress Notes (Signed)
Patient with gastric mass/esophageal cancer. Patient is followed by Dr. Benay Spice and receiving chemotherapy.  Received message from Manuela Schwartz, [place] saying that glucerna tube feeding formula has not been delivered to patient.    Spoke with patient in infusion.  Denies any nutrition concerns.  Noted weight increased to 169 lb.    Intervention: RD contacted [facility] and formula is out for delivery today.  Saw patient in infusion and discussed this with him.  Informed patient that if he did not receive formula today to call RD.   Informed RN, Manuela Schwartz as well.    Next visit: July 28, phone follow-up  Dustin Bumbaugh B. Zenia Resides, [place], [place] Registered Dietitian [phone] (pager)

## 2019-05-09 NOTE — Patient Instructions (Signed)
[facility] Discharge Instructions for Patients Receiving Chemotherapy  Today you received the following chemotherapy agents: Trastuzumab (Herceptin), Oxaliplatin (Eloxatin), Leucovorin, Fluorouracil (Adrucil, 5-FU)  To help prevent nausea and vomiting after your treatment, we encourage you to take your nausea medication as directed.   If you develop nausea and vomiting that is not controlled by your nausea medication, call the clinic.   BELOW ARE SYMPTOMS THAT SHOULD BE REPORTED IMMEDIATELY:  *FEVER GREATER THAN 100.5 F  *CHILLS WITH OR WITHOUT FEVER  NAUSEA AND VOMITING THAT IS NOT CONTROLLED WITH YOUR NAUSEA MEDICATION  *UNUSUAL SHORTNESS OF BREATH  *UNUSUAL BRUISING OR BLEEDING  TENDERNESS IN MOUTH AND THROAT WITH OR WITHOUT PRESENCE OF ULCERS  *URINARY PROBLEMS  *BOWEL PROBLEMS  UNUSUAL RASH Items with * indicate a potential emergency and should be followed up as soon as possible.  Feel free to call the clinic should you have any questions or concerns. The clinic phone number is [phone].  Please show the [facility] at check-in to the Emergency Department and triage nurse.  Coronavirus (COVID-19) Are you at risk?  Are you at risk for the Coronavirus (COVID-19)?  To be considered HIGH RISK for Coronavirus (COVID-19), you have to meet the following criteria:  . Traveled to [place], [place], [place], [place] or [place]; or in the [place] to [place], [place], [place], or [place]; and have fever, cough, and shortness of breath within the last 2 weeks of travel OR . Been in close contact with a person diagnosed with COVID-19 within the last 2 weeks and have fever, cough, and shortness of breath . IF YOU DO NOT MEET THESE CRITERIA, YOU ARE CONSIDERED LOW RISK FOR COVID-19.  What to do if you are HIGH RISK for COVID-19?  Marland Kitchen If you are having a medical emergency, call 911. . Seek medical care right away. Before you go to a doctor's  office, urgent care or emergency department, call ahead and tell them about your recent travel, contact with someone diagnosed with COVID-19, and your symptoms. You should receive instructions from your physician's office regarding next steps of care.  . When you arrive at healthcare provider, tell the healthcare staff immediately you have returned from visiting [place], [place], [place], [place] or [place]; or traveled in the [place] to [place], [place], [place], or [place]; in the last two weeks or you have been in close contact with a person diagnosed with COVID-19 in the last 2 weeks.   . Tell the health care staff about your symptoms: fever, cough and shortness of breath. . After you have been seen by a medical provider, you will be either: o Tested for (COVID-19) and discharged home on quarantine except to seek medical care if symptoms worsen, and asked to  - Stay home and avoid contact with others until you get your results (4-5 days)  - Avoid travel on public transportation if possible (such as bus, train, or airplane) or o Sent to the Emergency Department by EMS for evaluation, COVID-19 testing, and possible admission depending on your condition and test results.  What to do if you are LOW RISK for COVID-19?  Reduce your risk of any infection by using the same precautions used for avoiding the common cold or flu:  Marland Kitchen Wash your hands often with soap and warm water for at least 20 seconds.  If soap and water are not readily available, use an alcohol-based hand sanitizer with at least 60%  alcohol.  . If coughing or sneezing, cover your mouth and nose by coughing or sneezing into the elbow areas of your shirt or coat, into a tissue or into your sleeve (not your hands). . Avoid shaking hands with others and consider head nods or verbal greetings only. . Avoid touching your eyes, nose, or mouth with unwashed hands.  . Avoid close contact with people who are sick. . Avoid places or  events with large numbers of people in one location, like concerts or sporting events. . Carefully consider travel plans you have or are making. . If you are planning any travel outside or inside the [place], visit the CDC's Travelers' Health webpage for the latest health notices. . If you have some symptoms but not all symptoms, continue to monitor at home and seek medical attention if your symptoms worsen. . If you are having a medical emergency, call 911.   [place] / e-Visit: [email or web address]         [facility]: [facility]: [phone]                   [facility]: [phone]

## 2019-05-09 NOTE — Progress Notes (Signed)
MD reviewed labs today: OK to treat

## 2019-05-09 NOTE — Progress Notes (Signed)
[facility] OFFICE PROGRESS NOTE   Diagnosis: Gastroesophageal cancer  INTERVAL HISTORY:   Lance Herring was admitted on 04/27/2019 with severe hyperglycemia and anemia.  Anemia was felt to be related to bleeding from the gastroesophageal mass.  He was transfused with packed red blood cells.  He was discharged home on 04/29/2019.  He reports the blood sugar has been in the 150 range since discharge from the hospital.  No bleeding or dark stool.  He feels well today.  No neuropathy symptoms.  Objective:  Vital signs in last 24 hours:  Blood pressure (!) 148/70, pulse 92, temperature 98.9 F (37.2 C), temperature source Oral, resp. rate 18, height _0  (1.778 m), weight 169 lb 3.2 oz (76.7 kg), SpO2 98 %.    HEENT: Mild white coat over the tongue, no buccal thrush Cardio: Regular rate and rhythm, no gallop GI: No hepatomegaly, no mass, gastrostomy tube site without evidence of infection Vascular: No leg edema  Skin: Mild hyperpigmentation of the hands  Portacath/PICC-without erythema  Lab Results:  Lab Results  Component Value Date   WBC 10.2 05/09/2019   HGB 9.7 (L) 05/09/2019   HCT 30.2 (L) 05/09/2019   MCV 92.9 05/09/2019   PLT 261 05/09/2019   NEUTROABS 7.9 (H) 05/09/2019    CMP  Lab Results  Component Value Date   NA 142 04/29/2019   K 3.6 04/29/2019   CL 108 04/29/2019   CO2 28 04/29/2019   GLUCOSE 77 04/29/2019   BUN 29 (H) 04/29/2019   CREATININE 1.60 (H) 04/29/2019   CALCIUM 7.9 (L) 04/29/2019   PROT 6.1 (L) 04/27/2019   ALBUMIN 2.7 (L) 04/27/2019   AST 30 04/27/2019   ALT 29 04/27/2019   ALKPHOS 123 04/27/2019   BILITOT 0.6 04/27/2019   GFRNONAA 43 (L) 04/29/2019   GFRAA 49 (L) 04/29/2019    Lab Results  Component Value Date   CEA1 5.10 (H) 01/17/2019     Medications: I have reviewed the patient's current medications.   Assessment/Plan: 1. 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 Partially  obstructing esophagus mass 02/01/2017, biopsy confirmed moderately differentiated adenocarcinoma ? Staging CTs 03/06/2017-esophagus mass, single noncalcified pulmonary nodule the medial aspect of the left lower lobe, evidence of prior granulomatous disease, right adrenal adenoma ? PET scan 04/07/2017-hypermetabolic distal esophageal mass, no evidence of metastatic disease in the abdomen, pelvis, or skeleton. The right adrenal mass on CT was not hypermetabolic and favored to be an adenoma ? Radiation and concurrent weekly Taxol/carboplatin 04/20/2017-05/28/2017 ? CTs 07/05/2018-improvement in esophagus mass ? Endoscopy 07/26/2017-ulceration and friability from the GE junction up to 35 cm, 2 cm mass at the GE junction-improved in size, biopsy revealed no malignancy ? PET scan 08/19/2017-hypermetabolic activity at the distal esophagus and GE junction, no evidence of distant metastatic disease ? Admission to [facility] 11/18/2018 with a syncope event secondary to upper GI bleeding ? Upper endoscopy 11/18/2018-obstructing mass at 40 cm, biopsies revealed inflammation and no malignancy ? CTs 11/20/2018-10 mm nodule at the left lung apex, thickening at the distal esophagus, evidence of prior granulomatous disease with calcified right lung nodules and right paratracheal/hilar lymph nodes ? Upper endoscopy 12/07/2026- pinpoint stricture in the distal esophagus, lumen was 1 mm. Gastroesophageal mucosa with exudate consistent with ulcer. No malignancy.  ? PET scan 12/19/2018- focal area of increased uptake at or just below the level of the GE junction; moderate FDG uptake associated with left apical lung nodule. Bilateral pleural  effusions and lower lobe atelectasis. ? Endoscopy via gastrostomy tube tract 01/05/2019- mass at the gastric cardia/GE junction, biopsy confirmed adenocarcinoma, CPS-5,Her-2  3+ ? Cycle 1 FOLFOX 01/18/2019 ? Cycle 2 FOLFOX 02/02/2019 ? Cycle 3 FOLFOX 02/16/2019 ? Cycle 4 FOLFOX  03/02/2019 ? Cycle 5 FOLFOX 03/16/2019 ? CT chest 03/30/2019-new cavitary change in the 1 cm left upper lobe pulmonary nodule, stable in size.  Stable circumferential wall thickening of the lower thoracic esophagus/GE junction, no progressive disease ? Cycle 6 FOLFOX 03/30/2019, Herceptin added ? Cycle 7 FOLFOX/Herceptin 04/13/2019 ? Cycle 8 FOLFOX/Herceptin 05/09/2019 2. Upper GI bleeding secondary to #1, 11/18/2018-transfused with a total of 4 units of packed red blood cells 3. Nutrition-placement of gastrostomy feeding tube 11/22/2018  Feeding tube placed initial treatment in 2018, removed May 2019  4.Diabetes 5.Hypertension 6.Gout  7.Chronic renal failure 8.   Admission 04/27/2019 with severe hyperglycemia, weight loss, and dehydration 9.   Anemia-acute drop in hemoglobin noted 04/27/2019-transfused 2 units of packed red blood cells on 04/28/2019   Disposition: Mr. Hoard appears well.  The diabetes is under better control with insulin.  He reports drinking 2 cans of Jevity prior to arriving at the office today.  He plans to begin Glucerna this week.  The hemoglobin is improved compared to discharge from the hospital.  He denies recurrent bleeding.   The plan is to continue systemic therapy with FOLFOX/Herceptin.  He will complete another cycle today.  Mr. Dudzinski will return for an office visit and chemotherapy in 2 weeks.  We will switch to 5-FU/Herceptin after 9 or 10 cycles of FOLFOX.  Betsy Coder, MD  05/09/2019  10:21 AM

## 2019-05-10 ENCOUNTER — Inpatient Hospital Stay: Payer: Medicare Other

## 2019-05-10 ENCOUNTER — Encounter: Payer: Self-pay | Admitting: *Deleted

## 2019-05-10 NOTE — Progress Notes (Signed)
Call to [facility] to inform of pump d/c due on 05/11/19 at 1 pm.

## 2019-05-11 ENCOUNTER — Ambulatory Visit: Payer: Medicare Other | Admitting: Nurse Practitioner

## 2019-05-11 ENCOUNTER — Other Ambulatory Visit: Payer: Medicare Other

## 2019-05-12 ENCOUNTER — Telehealth: Payer: Self-pay | Admitting: Oncology

## 2019-05-12 NOTE — Telephone Encounter (Signed)
Called and spoke with patient. Confirmed date and time of appt  ° °

## 2019-05-16 ENCOUNTER — Telehealth: Payer: Self-pay | Admitting: *Deleted

## 2019-05-16 NOTE — Telephone Encounter (Signed)
Patient's dentist called for emergency dental clearance. Patient has 2 teeth that are causing severe pain. The dentist is confident these teeth are infected. Patient will have these teeth removed. Per Dr. Benay Spice ok to proceed with treatment plan.

## 2019-05-21 ENCOUNTER — Other Ambulatory Visit: Payer: Self-pay | Admitting: Oncology

## 2019-05-23 ENCOUNTER — Inpatient Hospital Stay: Payer: Medicare Other

## 2019-05-23 NOTE — Progress Notes (Signed)
Nutrition Follow-up:  Patient with gastric mass/esophageal cancer.  Followed by Dr. Benay Spice and receiving folfox.  Patient with recent hospitalization 7/2-04/29/2019 with hyperglycemia (blood glucose > 500). Patient started on insulin.  Spoke with patient via phone.  Patient able to start glucerna 1.5 on 7/15.  Reports that he is tolerating tube feeding well.  No issues with nausea.  Reports that he is taking about 5-6 cartons per day (2 cartons, 2 cartons, 1 carton and 1 carton).  Flushing tube with 8 oz of water (124ml before and after) each feeding.  Reports that usually has 1 bowel movement daily.  Sometimes stools are loose (usually after take ducolax) and sometimes firm.  Denies nausea.    Reports that he is eating some jello, broth, sometimes mashed potatoes but not often.  Drinks 2, 16 oz bottles of water daily.    Reports blood glucose in the 100s, 120s, 130s fasting in the am  Medications: amaryl, lantus, actos, KCL, phenergan  Labs: 7/14 labs K 3.4, glucose 246, BUN 14, creatinine 1.29  Anthropometrics:   Weight noted on 7/14 169 lb 3.2 oz increased from hospital weight of 164 lb on 7/4   Estimated Energy Needs  Kcals: 2000-2200 Protein: 95-110 g Fluid: > 2 L  NUTRITION DIAGNOSIS: Inadequate oral intake continues   INTERVENTION:  Continue glucerna 1.5, 6 cartons daily (2 cartons, 2 cartons, 1 carton and 1 carton).  Flush 150ml before and after each feeding.   Tube feeding regimen provides 2136 calories, 117 g protein and 2063ml water (forumla and water flush) Patient drinking an additional 93ml water daily for total of 3000 ml.    MONITORING, EVALUATION, GOAL: Patient will continue to tolerate tube feeding for better blood glucose control and prevent further weight loss   NEXT VISIT: to be determined with next treatment  Curren Mohrmann B. Zenia Resides, [place], Seth Ward Registered Dietitian [phone] (pager)

## 2019-05-25 ENCOUNTER — Inpatient Hospital Stay: Payer: Medicare Other

## 2019-05-25 ENCOUNTER — Other Ambulatory Visit: Payer: Self-pay

## 2019-05-25 ENCOUNTER — Encounter: Payer: Self-pay | Admitting: Nurse Practitioner

## 2019-05-25 ENCOUNTER — Inpatient Hospital Stay (HOSPITAL_BASED_OUTPATIENT_CLINIC_OR_DEPARTMENT_OTHER): Payer: Medicare Other | Admitting: Nurse Practitioner

## 2019-05-25 ENCOUNTER — Other Ambulatory Visit: Payer: Self-pay | Admitting: Oncology

## 2019-05-25 VITALS — BP 133/75 | HR 93 | Temp 99.1°F | Resp 18 | Ht 70.0 in | Wt 174.4 lb

## 2019-05-25 DIAGNOSIS — Z5112 Encounter for antineoplastic immunotherapy: Secondary | ICD-10-CM | POA: Diagnosis not present

## 2019-05-25 DIAGNOSIS — R911 Solitary pulmonary nodule: Secondary | ICD-10-CM | POA: Diagnosis not present

## 2019-05-25 DIAGNOSIS — C160 Malignant neoplasm of cardia: Secondary | ICD-10-CM | POA: Diagnosis not present

## 2019-05-25 DIAGNOSIS — J90 Pleural effusion, not elsewhere classified: Secondary | ICD-10-CM

## 2019-05-25 DIAGNOSIS — D709 Neutropenia, unspecified: Secondary | ICD-10-CM

## 2019-05-25 DIAGNOSIS — E1122 Type 2 diabetes mellitus with diabetic chronic kidney disease: Secondary | ICD-10-CM

## 2019-05-25 DIAGNOSIS — C155 Malignant neoplasm of lower third of esophagus: Secondary | ICD-10-CM

## 2019-05-25 DIAGNOSIS — D649 Anemia, unspecified: Secondary | ICD-10-CM

## 2019-05-25 DIAGNOSIS — I129 Hypertensive chronic kidney disease with stage 1 through stage 4 chronic kidney disease, or unspecified chronic kidney disease: Secondary | ICD-10-CM

## 2019-05-25 DIAGNOSIS — N189 Chronic kidney disease, unspecified: Secondary | ICD-10-CM

## 2019-05-25 DIAGNOSIS — Z95828 Presence of other vascular implants and grafts: Secondary | ICD-10-CM

## 2019-05-25 LAB — CMP (CANCER CENTER ONLY)
ALT: 35 U/L (ref 0–44)
AST: 39 U/L (ref 15–41)
Albumin: 2.5 g/dL — ABNORMAL LOW (ref 3.5–5.0)
Alkaline Phosphatase: 152 U/L — ABNORMAL HIGH (ref 38–126)
Anion gap: 7 (ref 5–15)
BUN: 24 mg/dL — ABNORMAL HIGH (ref 8–23)
CO2: 28 mmol/L (ref 22–32)
Calcium: 8.8 mg/dL — ABNORMAL LOW (ref 8.9–10.3)
Chloride: 100 mmol/L (ref 98–111)
Creatinine: 1.4 mg/dL — ABNORMAL HIGH (ref 0.61–1.24)
GFR, Est AFR Am: 58 mL/min — ABNORMAL LOW (ref >60)
GFR, Estimated: 50 mL/min — ABNORMAL LOW (ref >60)
Glucose, Bld: 167 mg/dL — ABNORMAL HIGH (ref 70–99)
Potassium: 4 mmol/L (ref 3.5–5.1)
Sodium: 135 mmol/L (ref 135–145)
Total Bilirubin: 0.4 mg/dL (ref 0.3–1.2)
Total Protein: 5.9 g/dL — ABNORMAL LOW (ref 6.5–8.1)

## 2019-05-25 LAB — CBC WITH DIFFERENTIAL (CANCER CENTER ONLY)
Abs Immature Granulocytes: 0.01 10*3/uL (ref 0.00–0.07)
Basophils Absolute: 0 10*3/uL (ref 0.0–0.1)
Basophils Relative: 1 %
Eosinophils Absolute: 0 10*3/uL (ref 0.0–0.5)
Eosinophils Relative: 1 %
HCT: 27.5 % — ABNORMAL LOW (ref 39.0–52.0)
Hemoglobin: 8.9 g/dL — ABNORMAL LOW (ref 13.0–17.0)
Immature Granulocytes: 0 %
Lymphocytes Relative: 22 %
Lymphs Abs: 0.7 10*3/uL (ref 0.7–4.0)
MCH: 30.8 pg (ref 26.0–34.0)
MCHC: 32.4 g/dL (ref 30.0–36.0)
MCV: 95.2 fL (ref 80.0–100.0)
Monocytes Absolute: 1.1 10*3/uL — ABNORMAL HIGH (ref 0.1–1.0)
Monocytes Relative: 35 %
Neutro Abs: 1.3 10*3/uL — ABNORMAL LOW (ref 1.7–7.7)
Neutrophils Relative %: 41 %
Platelet Count: 242 10*3/uL (ref 150–400)
RBC: 2.89 MIL/uL — ABNORMAL LOW (ref 4.22–5.81)
RDW: 18 % — ABNORMAL HIGH (ref 11.5–15.5)
WBC Count: 3 10*3/uL — ABNORMAL LOW (ref 4.0–10.5)
nRBC: 0 % (ref 0.0–0.2)

## 2019-05-25 MED ORDER — HEPARIN SOD (PORK) LOCK FLUSH 100 UNIT/ML IV SOLN
500.0000 [IU] | Freq: Once | INTRAVENOUS | Status: DC | PRN
  Filled 2019-05-25: qty 5

## 2019-05-25 MED ORDER — ACETAMINOPHEN 160 MG/5ML PO SOLN
650.0000 mg | Freq: Once | ORAL | Status: AC
  Administered 2019-05-25: 650 mg via ORAL

## 2019-05-25 MED ORDER — DIPHENHYDRAMINE HCL 12.5 MG/5ML PO ELIX
ORAL_SOLUTION | ORAL | Status: AC
  Filled 2019-05-25: qty 5

## 2019-05-25 MED ORDER — SODIUM CHLORIDE 0.9 % IV SOLN
Freq: Once | INTRAVENOUS | Status: AC
  Administered 2019-05-25: 11:00:00 via INTRAVENOUS
  Filled 2019-05-25: qty 250

## 2019-05-25 MED ORDER — TRASTUZUMAB CHEMO 150 MG IV SOLR
300.0000 mg | Freq: Once | INTRAVENOUS | Status: AC
  Administered 2019-05-25: 300 mg via INTRAVENOUS
  Filled 2019-05-25: qty 14.29

## 2019-05-25 MED ORDER — SODIUM CHLORIDE 0.9 % IV SOLN
2400.0000 mg/m2 | INTRAVENOUS | Status: DC
  Administered 2019-05-25: 4750 mg via INTRAVENOUS
  Filled 2019-05-25: qty 95

## 2019-05-25 MED ORDER — SODIUM CHLORIDE 0.9% FLUSH
10.0000 mL | INTRAVENOUS | Status: DC | PRN
  Filled 2019-05-25: qty 10

## 2019-05-25 MED ORDER — DIPHENHYDRAMINE HCL 12.5 MG/5ML PO ELIX
25.0000 mg | ORAL_SOLUTION | Freq: Once | ORAL | Status: AC
  Administered 2019-05-25: 12:00:00 25 mg via ORAL

## 2019-05-25 MED ORDER — ACETAMINOPHEN 160 MG/5ML PO SOLN
ORAL | Status: AC
  Filled 2019-05-25: qty 20.3

## 2019-05-25 MED ORDER — SODIUM CHLORIDE 0.9% FLUSH
10.0000 mL | INTRAVENOUS | Status: DC | PRN
  Administered 2019-05-25: 10:00:00 10 mL
  Filled 2019-05-25: qty 10

## 2019-05-25 NOTE — Patient Instructions (Signed)
[facility] Discharge Instructions for Patients Receiving Chemotherapy  Today you received the following chemotherapy agents: Trastuzumab (Herceptin), Fluorouracil (Adrucil, 5-FU)  To help prevent nausea and vomiting after your treatment, we encourage you to take your nausea medication as directed.   If you develop nausea and vomiting that is not controlled by your nausea medication, call the clinic.   BELOW ARE SYMPTOMS THAT SHOULD BE REPORTED IMMEDIATELY:  *FEVER GREATER THAN 100.5 F  *CHILLS WITH OR WITHOUT FEVER  NAUSEA AND VOMITING THAT IS NOT CONTROLLED WITH YOUR NAUSEA MEDICATION  *UNUSUAL SHORTNESS OF BREATH  *UNUSUAL BRUISING OR BLEEDING  TENDERNESS IN MOUTH AND THROAT WITH OR WITHOUT PRESENCE OF ULCERS  *URINARY PROBLEMS  *BOWEL PROBLEMS  UNUSUAL RASH Items with * indicate a potential emergency and should be followed up as soon as possible.  Feel free to call the clinic should you have any questions or concerns. The clinic phone number is [phone].  Please show the [facility] at check-in to the Emergency Department and triage nurse.  Coronavirus (COVID-19) Are you at risk?  Are you at risk for the Coronavirus (COVID-19)?  To be considered HIGH RISK for Coronavirus (COVID-19), you have to meet the following criteria:  . Traveled to [place], [place], [place], [place] or [place]; or in the [place] to [place], [place], [place], or [place]; and have fever, cough, and shortness of breath within the last 2 weeks of travel OR . Been in close contact with a person diagnosed with COVID-19 within the last 2 weeks and have fever, cough, and shortness of breath . IF YOU DO NOT MEET THESE CRITERIA, YOU ARE CONSIDERED LOW RISK FOR COVID-19.  What to do if you are HIGH RISK for COVID-19?  Marland Kitchen If you are having a medical emergency, call 911. . Seek medical care right away. Before you go to a doctor's office, urgent care or emergency  department, call ahead and tell them about your recent travel, contact with someone diagnosed with COVID-19, and your symptoms. You should receive instructions from your physician's office regarding next steps of care.  . When you arrive at healthcare provider, tell the healthcare staff immediately you have returned from visiting [place], [place], [place], [place] or [place]; or traveled in the [place] to [place], [place], [place], or [place]; in the last two weeks or you have been in close contact with a person diagnosed with COVID-19 in the last 2 weeks.   . Tell the health care staff about your symptoms: fever, cough and shortness of breath. . After you have been seen by a medical provider, you will be either: o Tested for (COVID-19) and discharged home on quarantine except to seek medical care if symptoms worsen, and asked to  - Stay home and avoid contact with others until you get your results (4-5 days)  - Avoid travel on public transportation if possible (such as bus, train, or airplane) or o Sent to the Emergency Department by EMS for evaluation, COVID-19 testing, and possible admission depending on your condition and test results.  What to do if you are LOW RISK for COVID-19?  Reduce your risk of any infection by using the same precautions used for avoiding the common cold or flu:  Marland Kitchen Wash your hands often with soap and warm water for at least 20 seconds.  If soap and water are not readily available, use an alcohol-based hand sanitizer with at least 60% alcohol.  Marland Kitchen  If coughing or sneezing, cover your mouth and nose by coughing or sneezing into the elbow areas of your shirt or coat, into a tissue or into your sleeve (not your hands). . Avoid shaking hands with others and consider head nods or verbal greetings only. . Avoid touching your eyes, nose, or mouth with unwashed hands.  . Avoid close contact with people who are sick. . Avoid places or events with large numbers of people  in one location, like concerts or sporting events. . Carefully consider travel plans you have or are making. . If you are planning any travel outside or inside the [place], visit the CDC's Travelers' Health webpage for the latest health notices. . If you have some symptoms but not all symptoms, continue to monitor at home and seek medical attention if your symptoms worsen. . If you are having a medical emergency, call 911.   ADDITIONAL HEALTHCARE OPTIONS FOR PATIENTS  [facility] / e-Visit: [email or web address]         [facility]: [phone]  [facility]: [phone]                   [facility]: [phone]   

## 2019-05-25 NOTE — Progress Notes (Addendum)
[facility] OFFICE PROGRESS NOTE   Diagnosis: Gastroesophageal cancer  INTERVAL HISTORY:   Mr. Scheer returns as scheduled.  He completed cycle 8 FOLFOX/Herceptin 05/09/2019.  He denies nausea/vomiting.  No mouth sores.  No diarrhea.  Cold sensitivity lasted 2 or 3 days.  No persistent neuropathy symptoms.  He reports good control of blood sugars.  He has a good appetite.  He continues tube feedings.  He is tolerating soft solids without difficulty.  Objective:  Vital signs in last 24 hours:  Blood pressure 133/75, pulse 93, temperature 99.1 F (37.3 C), temperature source Oral, resp. rate 18, height _0  (1.778 m), weight 174 lb 6.4 oz (79.1 kg), SpO2 96 %.    HEENT: Mild white coating of her tongue.  No buccal thrush.  No ulcers. GI: Abdomen soft and nontender.  No hepatomegaly.  Left abdomen feeding tube site without erythema. Vascular: No leg edema. Neuro: Vibratory sense with moderate decrease over the fingertips per tuning fork exam. Skin: Palms without erythema. Port-A-Cath without erythema.   Lab Results:  Lab Results  Component Value Date   WBC 3.0 (L) 05/25/2019   HGB 8.9 (L) 05/25/2019   HCT 27.5 (L) 05/25/2019   MCV 95.2 05/25/2019   PLT 242 05/25/2019   NEUTROABS 1.3 (L) 05/25/2019    Imaging:  No results found.  Medications: I have reviewed the patient's current medications.  Assessment/Plan: 1. 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 Partially obstructing esophagus mass 02/01/2017, biopsy confirmed moderately differentiated adenocarcinoma ? Staging CTs 03/06/2017-esophagus mass, single noncalcified pulmonary nodule the medial aspect of the left lower lobe, evidence of prior granulomatous disease, right adrenal adenoma ? PET scan 04/07/2017-hypermetabolic distal esophageal mass, no evidence of metastatic disease in the abdomen, pelvis, or skeleton. The right adrenal mass on CT was not hypermetabolic and  favored to be an adenoma ? Radiation and concurrent weekly Taxol/carboplatin 04/20/2017-05/28/2017 ? CTs 07/05/2018-improvement in esophagus mass ? Endoscopy 07/26/2017-ulceration and friability from the GE junction up to 35 cm, 2 cm mass at the GE junction-improved in size, biopsy revealed no malignancy ? PET scan 08/19/2017-hypermetabolic activity at the distal esophagus and GE junction, no evidence of distant metastatic disease ? Admission to [facility] 11/18/2018 with a syncope event secondary to upper GI bleeding ? Upper endoscopy 11/18/2018-obstructing mass at 40 cm, biopsies revealed inflammation and no malignancy ? CTs 11/20/2018-10 mm nodule at the left lung apex, thickening at the distal esophagus, evidence of prior granulomatous disease with calcified right lung nodules and right paratracheal/hilar lymph nodes ? Upper endoscopy 12/07/2026-pinpoint stricture in the distal esophagus, lumen was 1 mm. Gastroesophageal mucosa with exudate consistent with ulcer. No malignancy.  ? PET scan 12/19/2018-focal area of increased uptake at or just below the level of the GE junction; moderate FDG uptake associated with left apical lung nodule. Bilateral pleural effusions and lower lobe atelectasis. ? Endoscopy via gastrostomy tube tract 01/05/2019-mass at the gastric cardia/GE junction, biopsy confirmed adenocarcinoma, CPS-5,Her-2  3+ ? Cycle 1 FOLFOX 01/18/2019 ? Cycle 2 FOLFOX 02/02/2019 ? Cycle 3 FOLFOX 02/16/2019 ? Cycle 4 FOLFOX 03/02/2019 ? Cycle 5 FOLFOX 03/16/2019 ? CT chest 03/30/2019-new cavitary change in the 1 cm left upper lobe pulmonary nodule, stable in size.  Stable circumferential wall thickening of the lower thoracic esophagus/GE junction, no progressive disease ? Cycle 6 FOLFOX 03/30/2019, Herceptin added ? Cycle 7 FOLFOX/Herceptin 04/13/2019 ? Cycle 8 FOLFOX/Herceptin 05/09/2019 ? Cycle 9 FOLFOX/Herceptin 05/25/2019 (oxaliplatin held due to neutropenia) 2.  Upper GI bleeding  secondary to #1, 11/18/2018-transfused with a total of 4 units of packed red blood cells 3. Nutrition-placement of gastrostomy feeding tube 11/22/2018  Feeding tube placed initial treatment in 2018, removed May 2019  4.Diabetes 5.Hypertension 6.Gout 7.Chronic renal failure 8.   Admission 04/27/2019 with severe hyperglycemia, weight loss, and dehydration 9.   Anemia-acute drop in hemoglobin noted 04/27/2019-transfused 2 units of packed red blood cells on 04/28/2019   Disposition: Mr. Pop appears stable.  He has completed 8 cycles of FOLFOX/Herceptin.  He is tolerating treatment well.  Plan to proceed with cycle 9 today as scheduled with the exception of Oxaliplatin which will be held due to mild neutropenia.  We reviewed neutropenic precautions.  He understands to contact the office with fever, chills, other signs of infection.  He will return for lab, follow-up, cycle 10 FOLFOX in 2 weeks.  Treatment will be switched to 5-FU/Herceptin after cycle 10.  Patient seen with Dr. Benay Spice.  25 minutes were spent face-to-face at today's visit with the majority of that time involved in counseling/coordination of care.    Ned Card ANP/GNP-BC   05/25/2019  10:42 AM  This was a shared visit with Ned Card.  Mr. Cuff has mild neutropenia.  We decided to hold oxaliplatin today.  G-CSF support will be added with the next cycle of chemotherapy.  I will contact Dr. Servando Snare regarding surgical options.  Julieanne Manson, MD

## 2019-05-26 ENCOUNTER — Telehealth: Payer: Self-pay | Admitting: Nurse Practitioner

## 2019-05-26 NOTE — Telephone Encounter (Signed)
Called and spoke with patient. Confirmed date and time  °

## 2019-05-28 NOTE — Progress Notes (Signed)
The following biosimilar Ogivri (trastuzumab-dkst) has been selected for use in this patient.  Kennith Center, Pharm.D., CPP 05/28/2019@10 :33 AM

## 2019-06-04 ENCOUNTER — Other Ambulatory Visit: Payer: Self-pay | Admitting: Oncology

## 2019-06-08 ENCOUNTER — Telehealth: Payer: Self-pay

## 2019-06-08 ENCOUNTER — Inpatient Hospital Stay: Payer: Medicare Other

## 2019-06-08 ENCOUNTER — Other Ambulatory Visit: Payer: Self-pay

## 2019-06-08 ENCOUNTER — Inpatient Hospital Stay: Payer: Medicare Other | Attending: Oncology | Admitting: Nurse Practitioner

## 2019-06-08 ENCOUNTER — Inpatient Hospital Stay: Payer: Medicare Other | Admitting: Nutrition

## 2019-06-08 ENCOUNTER — Encounter: Payer: Self-pay | Admitting: Nurse Practitioner

## 2019-06-08 VITALS — BP 142/74 | HR 86 | Temp 98.9°F | Resp 17 | Ht 70.0 in | Wt 173.8 lb

## 2019-06-08 DIAGNOSIS — C155 Malignant neoplasm of lower third of esophagus: Secondary | ICD-10-CM

## 2019-06-08 DIAGNOSIS — Z5111 Encounter for antineoplastic chemotherapy: Secondary | ICD-10-CM | POA: Insufficient documentation

## 2019-06-08 DIAGNOSIS — Z5112 Encounter for antineoplastic immunotherapy: Secondary | ICD-10-CM | POA: Insufficient documentation

## 2019-06-08 DIAGNOSIS — Z95828 Presence of other vascular implants and grafts: Secondary | ICD-10-CM

## 2019-06-08 LAB — CMP (CANCER CENTER ONLY)
ALT: 23 U/L (ref 0–44)
AST: 23 U/L (ref 15–41)
Albumin: 3 g/dL — ABNORMAL LOW (ref 3.5–5.0)
Alkaline Phosphatase: 166 U/L — ABNORMAL HIGH (ref 38–126)
Anion gap: 9 (ref 5–15)
BUN: 27 mg/dL — ABNORMAL HIGH (ref 8–23)
CO2: 28 mmol/L (ref 22–32)
Calcium: 9.3 mg/dL (ref 8.9–10.3)
Chloride: 100 mmol/L (ref 98–111)
Creatinine: 1.57 mg/dL — ABNORMAL HIGH (ref 0.61–1.24)
GFR, Est AFR Am: 51 mL/min — ABNORMAL LOW (ref >60)
GFR, Estimated: 44 mL/min — ABNORMAL LOW (ref >60)
Glucose, Bld: 131 mg/dL — ABNORMAL HIGH (ref 70–99)
Potassium: 4 mmol/L (ref 3.5–5.1)
Sodium: 137 mmol/L (ref 135–145)
Total Bilirubin: 0.4 mg/dL (ref 0.3–1.2)
Total Protein: 6.6 g/dL (ref 6.5–8.1)

## 2019-06-08 LAB — CBC WITH DIFFERENTIAL (CANCER CENTER ONLY)
Abs Immature Granulocytes: 0.02 10*3/uL (ref 0.00–0.07)
Basophils Absolute: 0 10*3/uL (ref 0.0–0.1)
Basophils Relative: 0 %
Eosinophils Absolute: 0 10*3/uL (ref 0.0–0.5)
Eosinophils Relative: 1 %
HCT: 28.4 % — ABNORMAL LOW (ref 39.0–52.0)
Hemoglobin: 9.2 g/dL — ABNORMAL LOW (ref 13.0–17.0)
Immature Granulocytes: 0 %
Lymphocytes Relative: 9 %
Lymphs Abs: 0.6 10*3/uL — ABNORMAL LOW (ref 0.7–4.0)
MCH: 31.5 pg (ref 26.0–34.0)
MCHC: 32.4 g/dL (ref 30.0–36.0)
MCV: 97.3 fL (ref 80.0–100.0)
Monocytes Absolute: 1.2 10*3/uL — ABNORMAL HIGH (ref 0.1–1.0)
Monocytes Relative: 17 %
Neutro Abs: 5.2 10*3/uL (ref 1.7–7.7)
Neutrophils Relative %: 73 %
Platelet Count: 240 10*3/uL (ref 150–400)
RBC: 2.92 MIL/uL — ABNORMAL LOW (ref 4.22–5.81)
RDW: 18.2 % — ABNORMAL HIGH (ref 11.5–15.5)
WBC Count: 7.1 10*3/uL (ref 4.0–10.5)
nRBC: 0 % (ref 0.0–0.2)

## 2019-06-08 MED ORDER — DEXAMETHASONE SODIUM PHOSPHATE 10 MG/ML IJ SOLN
INTRAMUSCULAR | Status: AC
  Filled 2019-06-08: qty 1

## 2019-06-08 MED ORDER — DIPHENHYDRAMINE HCL 12.5 MG/5ML PO ELIX
25.0000 mg | ORAL_SOLUTION | Freq: Once | ORAL | Status: AC
  Administered 2019-06-08: 14:00:00 25 mg via ORAL

## 2019-06-08 MED ORDER — ACETAMINOPHEN 160 MG/5ML PO SOLN
ORAL | Status: AC
  Filled 2019-06-08: qty 20.3

## 2019-06-08 MED ORDER — DEXTROSE 5 % IV SOLN
Freq: Once | INTRAVENOUS | Status: AC
  Administered 2019-06-08: 15:00:00 via INTRAVENOUS
  Filled 2019-06-08: qty 250

## 2019-06-08 MED ORDER — ACETAMINOPHEN 160 MG/5ML PO SOLN
650.0000 mg | Freq: Once | ORAL | Status: AC
  Administered 2019-06-08: 14:00:00 650 mg via ORAL

## 2019-06-08 MED ORDER — PALONOSETRON HCL INJECTION 0.25 MG/5ML
INTRAVENOUS | Status: AC
  Filled 2019-06-08: qty 5

## 2019-06-08 MED ORDER — OXALIPLATIN CHEMO INJECTION 100 MG/20ML
85.0000 mg/m2 | Freq: Once | INTRAVENOUS | Status: AC
  Administered 2019-06-08: 15:00:00 165 mg via INTRAVENOUS
  Filled 2019-06-08: qty 33

## 2019-06-08 MED ORDER — SODIUM CHLORIDE 0.9 % IV SOLN
2400.0000 mg/m2 | INTRAVENOUS | Status: DC
  Administered 2019-06-08: 18:00:00 4750 mg via INTRAVENOUS
  Filled 2019-06-08: qty 95

## 2019-06-08 MED ORDER — LEUCOVORIN CALCIUM INJECTION 350 MG
400.0000 mg/m2 | Freq: Once | INTRAVENOUS | Status: AC
  Administered 2019-06-08: 15:00:00 788 mg via INTRAVENOUS
  Filled 2019-06-08: qty 39.4

## 2019-06-08 MED ORDER — DIPHENHYDRAMINE HCL 12.5 MG/5ML PO ELIX
ORAL_SOLUTION | ORAL | Status: AC
  Filled 2019-06-08: qty 5

## 2019-06-08 MED ORDER — SODIUM CHLORIDE 0.9% FLUSH
10.0000 mL | INTRAVENOUS | Status: DC | PRN
  Administered 2019-06-08: 10 mL
  Filled 2019-06-08: qty 10

## 2019-06-08 MED ORDER — TRASTUZUMAB-DKST CHEMO 150 MG IV SOLR
300.0000 mg | Freq: Once | INTRAVENOUS | Status: AC
  Administered 2019-06-08: 14:00:00 300 mg via INTRAVENOUS
  Filled 2019-06-08: qty 14.29

## 2019-06-08 MED ORDER — FLUOROURACIL CHEMO INJECTION 2.5 GM/50ML
400.0000 mg/m2 | Freq: Once | INTRAVENOUS | Status: AC
  Administered 2019-06-08: 17:00:00 800 mg via INTRAVENOUS
  Filled 2019-06-08: qty 16

## 2019-06-08 MED ORDER — PALONOSETRON HCL INJECTION 0.25 MG/5ML
0.2500 mg | Freq: Once | INTRAVENOUS | Status: AC
  Administered 2019-06-08: 0.25 mg via INTRAVENOUS

## 2019-06-08 MED ORDER — DEXAMETHASONE SODIUM PHOSPHATE 10 MG/ML IJ SOLN
10.0000 mg | Freq: Once | INTRAMUSCULAR | Status: AC
  Administered 2019-06-08: 14:00:00 10 mg via INTRAVENOUS

## 2019-06-08 MED ORDER — SODIUM CHLORIDE 0.9 % IV SOLN
Freq: Once | INTRAVENOUS | Status: AC
  Administered 2019-06-08: 13:00:00 via INTRAVENOUS
  Filled 2019-06-08: qty 250

## 2019-06-08 NOTE — Progress Notes (Signed)
Nutrition follow-up completed with patient receiving treatment for gastric mass/esophageal cancer. Patient is tolerating 6 bottles Glucerna 1.5 via feeding tube daily.  He is flushing his feeding tube with 120 mL water before and after each feeding. Patient denies nutrition impact symptoms. Noted labs: Glucose 131, BUN 27, creatinine 1.57, and albumin 3.0. Weight stable at 173.8 pounds on August 13 down slightly from 174.4 pounds July 30. Patient denies questions or concerns.  Estimated nutrition needs: 2000-2200 cal, 95-110 g protein, 2.0 L fluid.  Nutrition diagnosis: Inadequate oral intake continues.  Intervention: Continue Glucerna 1.5, 6 cartons daily with 120 mL of free water before and after each bolus feeding.  Patient continues to drink additional water daily.  Tube feeding provides 2136 cal, 117 g protein, and 2040 mL of water including the formula and free water flushes.  Monitoring, evaluation, goals: Patient will continue to tolerate tube feeding at goal rate for weight maintenance.  Next visit: Will schedule follow-up as needed.  Patient has contact information for questions or concerns.  **Disclaimer: This note was dictated with voice recognition software. Similar sounding words can inadvertently be transcribed and this note may contain transcription errors which may not have been corrected upon publication of note.**

## 2019-06-08 NOTE — Patient Instructions (Signed)
[facility] Discharge Instructions for Patients Receiving Chemotherapy  Today you received the following chemotherapy agents Trastuzumab-dkst (OGIVRI), Oxaliplatin (ELOXATIN), Leucovorin, Flourouracil (ADRUCIL).  To help prevent nausea and vomiting after your treatment, we encourage you to take your nausea medication as prescribed.   If you develop nausea and vomiting that is not controlled by your nausea medication, call the clinic.   BELOW ARE SYMPTOMS THAT SHOULD BE REPORTED IMMEDIATELY:  *FEVER GREATER THAN 100.5 F  *CHILLS WITH OR WITHOUT FEVER  NAUSEA AND VOMITING THAT IS NOT CONTROLLED WITH YOUR NAUSEA MEDICATION  *UNUSUAL SHORTNESS OF BREATH  *UNUSUAL BRUISING OR BLEEDING  TENDERNESS IN MOUTH AND THROAT WITH OR WITHOUT PRESENCE OF ULCERS  *URINARY PROBLEMS  *BOWEL PROBLEMS  UNUSUAL RASH Items with * indicate a potential emergency and should be followed up as soon as possible.  Feel free to call the clinic should you have any questions or concerns. The clinic phone number is [phone].  Please show the [facility] at check-in to the Emergency Department and triage nurse.  Coronavirus (COVID-19) Are you at risk?  Are you at risk for the Coronavirus (COVID-19)?  To be considered HIGH RISK for Coronavirus (COVID-19), you have to meet the following criteria:  . Traveled to [place], [place], [place], [place] or [place]; or in the [place] to [place], [place], [place], or [place]; and have fever, cough, and shortness of breath within the last 2 weeks of travel OR . Been in close contact with a person diagnosed with COVID-19 within the last 2 weeks and have fever, cough, and shortness of breath . IF YOU DO NOT MEET THESE CRITERIA, YOU ARE CONSIDERED LOW RISK FOR COVID-19.  What to do if you are HIGH RISK for COVID-19?  Marland Kitchen If you are having a medical emergency, call 911. . Seek medical care right away. Before you go to a doctor's office,  urgent care or emergency department, call ahead and tell them about your recent travel, contact with someone diagnosed with COVID-19, and your symptoms. You should receive instructions from your physician's office regarding next steps of care.  . When you arrive at healthcare provider, tell the healthcare staff immediately you have returned from visiting [place], [place], [place], [place] or [place]; or traveled in the [place] to [place], [place], [place], or [place]; in the last two weeks or you have been in close contact with a person diagnosed with COVID-19 in the last 2 weeks.   . Tell the health care staff about your symptoms: fever, cough and shortness of breath. . After you have been seen by a medical provider, you will be either: o Tested for (COVID-19) and discharged home on quarantine except to seek medical care if symptoms worsen, and asked to  - Stay home and avoid contact with others until you get your results (4-5 days)  - Avoid travel on public transportation if possible (such as bus, train, or airplane) or o Sent to the Emergency Department by EMS for evaluation, COVID-19 testing, and possible admission depending on your condition and test results.  What to do if you are LOW RISK for COVID-19?  Reduce your risk of any infection by using the same precautions used for avoiding the common cold or flu:  Marland Kitchen Wash your hands often with soap and warm water for at least 20 seconds.  If soap and water are not readily available, use an alcohol-based hand sanitizer with at least 60% alcohol.  Marland Kitchen  If coughing or sneezing, cover your mouth and nose by coughing or sneezing into the elbow areas of your shirt or coat, into a tissue or into your sleeve (not your hands). . Avoid shaking hands with others and consider head nods or verbal greetings only. . Avoid touching your eyes, nose, or mouth with unwashed hands.  . Avoid close contact with people who are sick. . Avoid places or events with  large numbers of people in one location, like concerts or sporting events. . Carefully consider travel plans you have or are making. . If you are planning any travel outside or inside the [place], visit the CDC's Travelers' Health webpage for the latest health notices. . If you have some symptoms but not all symptoms, continue to monitor at home and seek medical attention if your symptoms worsen. . If you are having a medical emergency, call 911.   [place] / e-Visit: [email or web address]         [facility]: [facility]: [phone]                   [facility]: [phone]

## 2019-06-08 NOTE — Progress Notes (Addendum)
[facility] OFFICE PROGRESS NOTE   Diagnosis: Gastroesophageal cancer  INTERVAL HISTORY:   Lance Herring returns as scheduled.  He completed cycle 9 FOLFOX/Herceptin 05/25/2019.  Oxaliplatin was held due to mild neutropenia.  He denies nausea/vomiting.  No mouth sores.  No diarrhea.  Mild numbness in the right great toe.  No other numbness or tingling.  He describes his appetite as "okay".  He continues tube feedings.  Objective:  Vital signs in last 24 hours:  Blood pressure (!) 142/74, pulse 86, temperature 98.9 F (37.2 C), temperature source Oral, resp. rate 17, height 5' 10"  (1.778 m), weight 173 lb 12.8 oz (78.8 kg), SpO2 97 %.    HEENT: No thrush or ulcers. GI: Abdomen soft and nontender.  No hepatomegaly.  Feeding tube site without erythema. Vascular: No leg edema. Skin: Palms with mild hyperpigmentation.  No erythema. Port-A-Cath without erythema.   Lab Results:  Lab Results  Component Value Date   WBC 7.1 06/08/2019   HGB 9.2 (L) 06/08/2019   HCT 28.4 (L) 06/08/2019   MCV 97.3 06/08/2019   PLT 240 06/08/2019   NEUTROABS 5.2 06/08/2019    Imaging:  No results found.  Medications: I have reviewed the patient's current medications.  Assessment/Plan: 1. 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 Partially obstructing esophagus mass 02/01/2017, biopsy confirmed moderately differentiated adenocarcinoma ? Staging CTs 03/06/2017-esophagus mass, single noncalcified pulmonary nodule the medial aspect of the left lower lobe, evidence of prior granulomatous disease, right adrenal adenoma ? PET scan 04/07/2017-hypermetabolic distal esophageal mass, no evidence of metastatic disease in the abdomen, pelvis, or skeleton. The right adrenal mass on CT was not hypermetabolic and favored to be an adenoma ? Radiation and concurrent weekly Taxol/carboplatin 04/20/2017-05/28/2017 ? CTs 07/05/2018-improvement in esophagus mass ? Endoscopy  07/26/2017-ulceration and friability from the GE junction up to 35 cm, 2 cm mass at the GE junction-improved in size, biopsy revealed no malignancy ? PET scan 08/19/2017-hypermetabolic activity at the distal esophagus and GE junction, no evidence of distant metastatic disease ? Admission to [facility] 11/18/2018 with a syncope event secondary to upper GI bleeding ? Upper endoscopy 11/18/2018-obstructing mass at 40 cm, biopsies revealed inflammation and no malignancy ? CTs 11/20/2018-10 mm nodule at the left lung apex, thickening at the distal esophagus, evidence of prior granulomatous disease with calcified right lung nodules and right paratracheal/hilar lymph nodes ? Upper endoscopy 12/07/2026-pinpoint stricture in the distal esophagus, lumen was 1 mm. Gastroesophageal mucosa with exudate consistent with ulcer. No malignancy.  ? PET scan 12/19/2018-focal area of increased uptake at or just below the level of the GE junction; moderate FDG uptake associated with left apical lung nodule. Bilateral pleural effusions and lower lobe atelectasis. ? Endoscopy via gastrostomy tube tract 01/05/2019-mass at the gastric cardia/GE junction, biopsy confirmed adenocarcinoma, CPS-5,Her-2 3+ ? Cycle 1 FOLFOX 01/18/2019 ? Cycle 2 FOLFOX 02/02/2019 ? Cycle 3 FOLFOX 02/16/2019 ? Cycle 4 FOLFOX 03/02/2019 ? Cycle 5 FOLFOX 03/16/2019 ? CT chest6/01/2019-new cavitary change in the 1 cm left upper lobe pulmonary nodule, stable in size. Stable circumferential wall thickening of the lower thoracic esophagus/GE junction, no progressive disease ? Cycle 6 FOLFOX 03/30/2019, Herceptin added ? Cycle 7 FOLFOX/Herceptin 04/13/2019 ? Cycle 8 FOLFOX/Herceptin 05/09/2019 ? Cycle 9 FOLFOX/Herceptin 05/25/2019 (oxaliplatin held due to neutropenia) ? Cycle 10 FOLFOX/Herceptin 06/08/2019 2. Upper GI bleeding secondary to #1, 11/18/2018-transfused with a total of 4 units of packed red blood cells 3. Nutrition-placement of  gastrostomy feeding tube  11/22/2018  Feeding tube placed initial treatment in 2018, removed May 2019  4.Diabetes 5.Hypertension 6.Gout 7.Chronic renal failure 8. Admission 04/27/2019 with severe hyperglycemia, weight loss, and dehydration 9. Anemia-acute drop in hemoglobin noted 04/27/2019-transfused 2 units of packed red blood cells on 04/28/2019  Disposition: Lance Herring appears stable.  He has completed 9 cycles of FOLFOX/Herceptin.  Oxaliplatin was held with cycle 9 due to mild neutropenia.  Plan to proceed with cycle 10 FOLFOX/Herceptin today as scheduled.  We decided against the addition of G-CSF support with this cycle.  He will undergo a restaging chest CT prior to his next visit in 2 weeks.  Treatment will be switched to 5-FU/Herceptin after cycle 10.  We reviewed the CBC from today.  Counts adequate to proceed with treatment.  We reviewed the chemistry panel.  Creatinine with stable elevation, okay to proceed with treatment.  Dr. Benay Spice discussed his case with Dr. Roxy Horseman.  Lance Herring is not a surgical candidate.  I offered a referral to Dr. Servando Snare so he can discuss this in person.  He declines the referral.  He will return for lab, follow-up, 5-FU/Herceptin in 2 weeks.  He will contact the office in the interim with any problems.  Plan reviewed with Dr. Benay Spice.  25 minutes were spent face-to-face at today's visit with the majority of that time involved in counseling/coordination of care.  Ned Card ANP/GNP-BC   06/08/2019  12:15 PM

## 2019-06-08 NOTE — Telephone Encounter (Signed)
TC to [facility] [phone] Spoke to [place] to schedule Pump removal for Pt. On Saturday 06/10/19 at 4:30 pm.

## 2019-06-10 ENCOUNTER — Inpatient Hospital Stay: Payer: Medicare Other

## 2019-06-10 ENCOUNTER — Telehealth: Payer: Self-pay | Admitting: *Deleted

## 2019-06-10 NOTE — Telephone Encounter (Signed)
RN placed call to pt to confirm apt for pump DC today.  Pt states that home health RN is set up to come to his home today to remove pump and pt will bring pump with him to his next treatment.

## 2019-06-13 ENCOUNTER — Telehealth: Payer: Self-pay | Admitting: Oncology

## 2019-06-13 NOTE — Telephone Encounter (Signed)
Called and spoke with patient. Confirmed appt date and time  °

## 2019-06-18 ENCOUNTER — Other Ambulatory Visit: Payer: Self-pay | Admitting: Oncology

## 2019-06-21 ENCOUNTER — Inpatient Hospital Stay (HOSPITAL_BASED_OUTPATIENT_CLINIC_OR_DEPARTMENT_OTHER): Payer: Medicare Other | Admitting: Nurse Practitioner

## 2019-06-21 ENCOUNTER — Encounter: Payer: Self-pay | Admitting: Nurse Practitioner

## 2019-06-21 ENCOUNTER — Encounter (HOSPITAL_COMMUNITY): Payer: Self-pay

## 2019-06-21 ENCOUNTER — Inpatient Hospital Stay: Payer: Medicare Other

## 2019-06-21 ENCOUNTER — Ambulatory Visit (HOSPITAL_COMMUNITY)
Admission: RE | Admit: 2019-06-21 | Discharge: 2019-06-21 | Disposition: A | Discharge status: Home / Self Care | Payer: Medicare Other | Source: Ambulatory Visit | Attending: Nurse Practitioner | Admitting: Nurse Practitioner

## 2019-06-21 ENCOUNTER — Other Ambulatory Visit: Payer: Self-pay

## 2019-06-21 VITALS — BP 146/74 | HR 100 | Temp 98.5°F | Resp 18 | Ht 70.0 in | Wt 174.8 lb

## 2019-06-21 DIAGNOSIS — Z5112 Encounter for antineoplastic immunotherapy: Secondary | ICD-10-CM | POA: Diagnosis not present

## 2019-06-21 DIAGNOSIS — C155 Malignant neoplasm of lower third of esophagus: Secondary | ICD-10-CM

## 2019-06-21 DIAGNOSIS — Z95828 Presence of other vascular implants and grafts: Secondary | ICD-10-CM

## 2019-06-21 LAB — CBC WITH DIFFERENTIAL (CANCER CENTER ONLY)
Abs Immature Granulocytes: 0.02 10*3/uL (ref 0.00–0.07)
Basophils Absolute: 0 10*3/uL (ref 0.0–0.1)
Basophils Relative: 0 %
Eosinophils Absolute: 0.1 10*3/uL (ref 0.0–0.5)
Eosinophils Relative: 1 %
HCT: 28.1 % — ABNORMAL LOW (ref 39.0–52.0)
Hemoglobin: 9.1 g/dL — ABNORMAL LOW (ref 13.0–17.0)
Immature Granulocytes: 0 %
Lymphocytes Relative: 10 %
Lymphs Abs: 0.6 10*3/uL — ABNORMAL LOW (ref 0.7–4.0)
MCH: 31.4 pg (ref 26.0–34.0)
MCHC: 32.4 g/dL (ref 30.0–36.0)
MCV: 96.9 fL (ref 80.0–100.0)
Monocytes Absolute: 1.3 10*3/uL — ABNORMAL HIGH (ref 0.1–1.0)
Monocytes Relative: 20 %
Neutro Abs: 4.4 10*3/uL (ref 1.7–7.7)
Neutrophils Relative %: 69 %
Platelet Count: 251 10*3/uL (ref 150–400)
RBC: 2.9 MIL/uL — ABNORMAL LOW (ref 4.22–5.81)
RDW: 16.8 % — ABNORMAL HIGH (ref 11.5–15.5)
WBC Count: 6.4 10*3/uL (ref 4.0–10.5)
nRBC: 0 % (ref 0.0–0.2)

## 2019-06-21 LAB — CMP (CANCER CENTER ONLY)
ALT: 60 U/L — ABNORMAL HIGH (ref 0–44)
AST: 50 U/L — ABNORMAL HIGH (ref 15–41)
Albumin: 3 g/dL — ABNORMAL LOW (ref 3.5–5.0)
Alkaline Phosphatase: 171 U/L — ABNORMAL HIGH (ref 38–126)
Anion gap: 11 (ref 5–15)
BUN: 32 mg/dL — ABNORMAL HIGH (ref 8–23)
CO2: 25 mmol/L (ref 22–32)
Calcium: 9.2 mg/dL (ref 8.9–10.3)
Chloride: 100 mmol/L (ref 98–111)
Creatinine: 1.45 mg/dL — ABNORMAL HIGH (ref 0.61–1.24)
GFR, Est AFR Am: 56 mL/min — ABNORMAL LOW (ref >60)
GFR, Estimated: 48 mL/min — ABNORMAL LOW (ref >60)
Glucose, Bld: 170 mg/dL — ABNORMAL HIGH (ref 70–99)
Potassium: 4.3 mmol/L (ref 3.5–5.1)
Sodium: 136 mmol/L (ref 135–145)
Total Bilirubin: 0.4 mg/dL (ref 0.3–1.2)
Total Protein: 6.4 g/dL — ABNORMAL LOW (ref 6.5–8.1)

## 2019-06-21 MED ORDER — FLUOROURACIL CHEMO INJECTION 2.5 GM/50ML
400.0000 mg/m2 | Freq: Once | INTRAVENOUS | Status: AC
  Administered 2019-06-21: 800 mg via INTRAVENOUS
  Filled 2019-06-21: qty 16

## 2019-06-21 MED ORDER — SODIUM CHLORIDE 0.9 % IV SOLN
Freq: Once | INTRAVENOUS | Status: AC
  Administered 2019-06-21: 13:00:00 via INTRAVENOUS
  Filled 2019-06-21: qty 250

## 2019-06-21 MED ORDER — DEXTROSE 5 % IV SOLN
Freq: Once | INTRAVENOUS | Status: DC
  Filled 2019-06-21: qty 250

## 2019-06-21 MED ORDER — SODIUM CHLORIDE 0.9% FLUSH
10.0000 mL | INTRAVENOUS | Status: DC | PRN
  Administered 2019-06-21: 08:00:00 10 mL
  Filled 2019-06-21: qty 10

## 2019-06-21 MED ORDER — DIPHENHYDRAMINE HCL 12.5 MG/5ML PO ELIX
25.0000 mg | ORAL_SOLUTION | Freq: Once | ORAL | Status: AC
  Administered 2019-06-21: 25 mg via ORAL

## 2019-06-21 MED ORDER — ACETAMINOPHEN 160 MG/5ML PO SOLN
ORAL | Status: AC
  Filled 2019-06-21: qty 20.3

## 2019-06-21 MED ORDER — SODIUM CHLORIDE 0.9 % IV SOLN
2400.0000 mg/m2 | INTRAVENOUS | Status: DC
  Administered 2019-06-21: 4750 mg via INTRAVENOUS
  Filled 2019-06-21: qty 95

## 2019-06-21 MED ORDER — LEUCOVORIN CALCIUM INJECTION 350 MG
400.0000 mg/m2 | Freq: Once | INTRAVENOUS | Status: AC
  Administered 2019-06-21: 15:00:00 788 mg via INTRAVENOUS
  Filled 2019-06-21: qty 39.4

## 2019-06-21 MED ORDER — DIPHENHYDRAMINE HCL 12.5 MG/5ML PO ELIX
ORAL_SOLUTION | ORAL | Status: AC
  Filled 2019-06-21: qty 10

## 2019-06-21 MED ORDER — ACETAMINOPHEN 160 MG/5ML PO SOLN
650.0000 mg | Freq: Once | ORAL | Status: AC
  Administered 2019-06-21: 13:00:00 650 mg via ORAL

## 2019-06-21 MED ORDER — TRASTUZUMAB-DKST CHEMO 150 MG IV SOLR
450.0000 mg | Freq: Once | INTRAVENOUS | Status: AC
  Administered 2019-06-21: 14:00:00 450 mg via INTRAVENOUS
  Filled 2019-06-21: qty 21.43

## 2019-06-21 NOTE — Progress Notes (Addendum)
[facility] OFFICE PROGRESS NOTE   Diagnosis: Gastroesophageal cancer  INTERVAL HISTORY:   Lance Herring returns as scheduled.  He completed cycle 10 FOLFOX/Herceptin 06/08/2019.  He denies nausea/vomiting.  No mouth sores.  No diarrhea.  No significant cold sensitivity.  He notes numbness in the right great toe and a few fingertips.  Objective:  Vital signs in last 24 hours:  Blood pressure (!) 146/74, pulse 100, temperature 98.5 F (36.9 C), temperature source Oral, resp. rate 18, height _0  (1.778 m), weight 174 lb 12.8 oz (79.3 kg), SpO2 100 %.    HEENT: No thrush or ulcers. GI: Abdomen soft and nontender.  No hepatomegaly.  Left abdomen feeding tube site without erythema. Vascular: No leg edema.  Skin: Palms without erythema. Port-A-Cath without erythema.   Lab Results:  Lab Results  Component Value Date   WBC 6.4 06/21/2019   HGB 9.1 (L) 06/21/2019   HCT 28.1 (L) 06/21/2019   MCV 96.9 06/21/2019   PLT 251 06/21/2019   NEUTROABS 4.4 06/21/2019    Imaging:  Ct Chest Wo Contrast  Result Date: 06/21/2019 CLINICAL DATA:  Esophageal cancer, ongoing chemotherapy. EXAM: CT CHEST WITHOUT CONTRAST TECHNIQUE: Multidetector CT imaging of the chest was performed following the standard protocol without IV contrast. COMPARISON:  03/30/2019. FINDINGS: Cardiovascular: Right IJ Port-A-Cath terminates in the right atrium. Atherosclerotic calcification of the aorta and coronary arteries. Heart is enlarged. No pericardial effusion. Mediastinum/Nodes: Calcified mediastinal and right hilar lymph nodes. No pathologically enlarged mediastinal or axillary lymph nodes. Esophagus is dilated throughout its course and contains an air-fluid level. Distal esophagus appears thickened. Lungs/Pleura: Scattered pulmonary parenchymal scarring, especially in the lower lobes. Post radiation bronchiectasis, volume loss and architectural distortion in the medial right hemithorax. Calcified  granulomas. Small cyst in the apical left upper lobe appears slightly less thick-walled than on the prior exam (7/19). Peribronchovascular ground-glass in the posterior segment right upper lobe (7/67-71) is new and likely infectious or inflammatory in etiology. Triangular-shaped subpleural lymph node along the left major fissure measures 4 mm unchanged. Trace left pleural effusion. Airway is unremarkable. Upper Abdomen: Subcentimeter low-attenuation lesion in the dome liver is too small to characterize. Visualized portion of the liver is unremarkable. Tiny stones are seen in the gallbladder. Fluid density lesions in the right adrenal gland measure 2.9 cm in the medial limb and 2.0 cm in the lateral limb, as before. Visualized portions of the left adrenal gland and right kidney are otherwise unremarkable. Low-attenuation lesions in the left kidney measure up to 2.0 cm, similar but incompletely imaged. Spleen and visualized portion the pancreas are unremarkable. Percutaneous gastrostomy is in place. No upper abdominal adenopathy. Musculoskeletal: No worrisome lytic or sclerotic lesions. IMPRESSION: 1. No evidence of metastatic disease. Small cyst in the apical left upper lobe appears less thick-walled than on the prior exam, indicative of positive treatment response. 2. Distal esophageal wall thickening, as before. 3. Trace left pleural effusion. 4. Cholelithiasis. 5. Right adrenal adenomas. 6. Aortic atherosclerosis (ICD10-170.0). Coronary artery calcification. Electronically Signed   By: Lorin Picket M.D.   On: 06/21/2019 09:22    Medications: I have reviewed the patient's current medications.  Assessment/Plan: 1. 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 Partially obstructing esophagus mass 02/01/2017, biopsy confirmed moderately differentiated adenocarcinoma ? Staging CTs 03/06/2017-esophagus mass, single noncalcified pulmonary nodule the medial aspect of the left lower  lobe, evidence of prior granulomatous disease, right adrenal adenoma ? PET scan 04/07/2017-hypermetabolic distal esophageal  mass, no evidence of metastatic disease in the abdomen, pelvis, or skeleton. The right adrenal mass on CT was not hypermetabolic and favored to be an adenoma ? Radiation and concurrent weekly Taxol/carboplatin 04/20/2017-05/28/2017 ? CTs 07/05/2018-improvement in esophagus mass ? Endoscopy 07/26/2017-ulceration and friability from the GE junction up to 35 cm, 2 cm mass at the GE junction-improved in size, biopsy revealed no malignancy ? PET scan 08/19/2017-hypermetabolic activity at the distal esophagus and GE junction, no evidence of distant metastatic disease ? Admission to [facility] 11/18/2018 with a syncope event secondary to upper GI bleeding ? Upper endoscopy 11/18/2018-obstructing mass at 40 cm, biopsies revealed inflammation and no malignancy ? CTs 11/20/2018-10 mm nodule at the left lung apex, thickening at the distal esophagus, evidence of prior granulomatous disease with calcified right lung nodules and right paratracheal/hilar lymph nodes ? Upper endoscopy 12/07/2026-pinpoint stricture in the distal esophagus, lumen was 1 mm. Gastroesophageal mucosa with exudate consistent with ulcer. No malignancy.  ? PET scan 12/19/2018-focal area of increased uptake at or just below the level of the GE junction; moderate FDG uptake associated with left apical lung nodule. Bilateral pleural effusions and lower lobe atelectasis. ? Endoscopy via gastrostomy tube tract 01/05/2019-mass at the gastric cardia/GE junction, biopsy confirmed adenocarcinoma, CPS-5,Her-2 3+ ? Cycle 1 FOLFOX 01/18/2019 ? Cycle 2 FOLFOX 02/02/2019 ? Cycle 3 FOLFOX 02/16/2019 ? Cycle 4 FOLFOX 03/02/2019 ? Cycle 5 FOLFOX 03/16/2019 ? CT chest6/01/2019-new cavitary change in the 1 cm left upper lobe pulmonary nodule, stable in size. Stable circumferential wall thickening of the lower thoracic esophagus/GE  junction, no progressive disease ? Cycle 6 FOLFOX 03/30/2019, Herceptin added ? Cycle 7 FOLFOX/Herceptin 04/13/2019 ? Cycle 8 FOLFOX/Herceptin 05/09/2019 ? Cycle 9 FOLFOX/Herceptin 05/25/2019 (oxaliplatin held due to neutropenia) ? Cycle 10 FOLFOX/Herceptin 06/08/2019 ? CT chest 06/21/2019- no evidence of metastatic disease.  Small cyst in the apical left upper lobe appears slightly less thick-walled than on prior exam.  Distal esophageal wall thickening as before.  Trace left pleural effusion. ? Cycle 11 5-FU/Herceptin 06/21/2019 2. Upper GI bleeding secondary to #1, 11/18/2018-transfused with a total of 4 units of packed red blood cells 3. Nutrition-placement of gastrostomy feeding tube 11/22/2018  Feeding tube placed initial treatment in 2018, removed May 2019  4.Diabetes 5.Hypertension 6.Gout 7.Chronic renal failure 8. Admission 04/27/2019 with severe hyperglycemia, weight loss, and dehydration 9. Anemia-acute drop in hemoglobin noted 04/27/2019-transfused 2 units of packed red blood cells on 04/28/2019   Disposition: Lance Herring appears stable.  He has completed 10 cycles of FOLFOX/Herceptin.  Restaging chest CT from earlier this morning overall stable.  Plan to continue treatment with 5-FU/Herceptin on a 3-week schedule.  We reviewed the labs from today.  CBC is adequate for treatment.  Mild elevation of transaminases may be related to oxaliplatin; creatinine with stable elevation; labs okay to proceed with treatment.    He will return for lab, follow-up, 5-FU/Herceptin in 3 weeks.  He will contact the office in the interim with any problems.  Patient seen with Dr. Benay Spice.  25 minutes were spent face-to-face at today's visit with the majority of that time involved in counseling/coordination of care.    Ned Card ANP/GNP-BC   06/21/2019  12:14 PM  This was a shared visit with Ned Card.  The restaging CT shows no evidence of disease progression.  The lesion continues  to be more cavitary. Continuing 5-FU and Herceptin.  Lance Herring is in agreement.  I discussed the case with Dr. Servando Snare earlier this month.  He feels Lance Herring is not a surgical candidate. Julieanne Manson, MD

## 2019-06-21 NOTE — Patient Instructions (Signed)
[facility] Discharge Instructions for Patients Receiving Chemotherapy  Today you received the following chemotherapy agents Trastuzumab, Leucovorin, Flourouracil (ADRUCIL).  To help prevent nausea and vomiting after your treatment, we encourage you to take your nausea medication as prescribed.   If you develop nausea and vomiting that is not controlled by your nausea medication, call the clinic.   BELOW ARE SYMPTOMS THAT SHOULD BE REPORTED IMMEDIATELY:  *FEVER GREATER THAN 100.5 F  *CHILLS WITH OR WITHOUT FEVER  NAUSEA AND VOMITING THAT IS NOT CONTROLLED WITH YOUR NAUSEA MEDICATION  *UNUSUAL SHORTNESS OF BREATH  *UNUSUAL BRUISING OR BLEEDING  TENDERNESS IN MOUTH AND THROAT WITH OR WITHOUT PRESENCE OF ULCERS  *URINARY PROBLEMS  *BOWEL PROBLEMS  UNUSUAL RASH Items with * indicate a potential emergency and should be followed up as soon as possible.  Feel free to call the clinic should you have any questions or concerns. The clinic phone number is [phone].  Please show the [facility] at check-in to the Emergency Department and triage nurse.

## 2019-06-22 ENCOUNTER — Telehealth: Payer: Self-pay | Admitting: Oncology

## 2019-06-22 ENCOUNTER — Telehealth: Payer: Self-pay | Admitting: *Deleted

## 2019-06-22 NOTE — Telephone Encounter (Signed)
Called and spoke with patient. Confirmed appt with patient.  °

## 2019-06-22 NOTE — Telephone Encounter (Signed)
Notified home care nurse he is due pump d/c tomorrow at 1500.

## 2019-07-09 ENCOUNTER — Other Ambulatory Visit: Payer: Self-pay | Admitting: Oncology

## 2019-07-11 ENCOUNTER — Other Ambulatory Visit: Payer: Self-pay | Admitting: Nurse Practitioner

## 2019-07-11 NOTE — Progress Notes (Signed)
[place]   [phone] [phone]   Clinic Follow up Note   Patient Care Team: [facility], [facility] as PCP - General 07/12/2019  CHIEF COMPLAINT: f/u gastroesophageal cancer  INTERVAL HISTORY: Mr. Lance Herring returns for f/u and treatment as scheduled. He completed another cycle of treatment starting 5FU/herceptin on a 3 week schedule on 06/21/19. He is doing well today. G tube got tangled up this morning and came out. Some drainage on bandage. Denies pain. He relies mostly on the tube for nutrition and was able to have glucerna this morning before it fell out. Takes in [place] 6 times per day usually, drinks water occasionally and can tolerate some soft foods. Otherwise, fatigue at baseline. Denies mucositis. Denies n/v/d. Denies pain. Neuropathy is slightly worse in hands, has some difficulty buttoning clothes; no pain or sensitivity. No recent fever, chills, cough, chest pain, dyspnea, or leg swelling.  MEDICAL HISTORY:  Past Medical History:  Diagnosis Date  . Cataract   . CKD (chronic kidney disease) stage 3, GFR 30-59 ml/min (HCC)   . DM2 (diabetes mellitus, type 2) ([place])   . Esophageal cancer ([place]) dx'd 2017  . Glaucoma   . Gout   . HTN (hypertension)     SURGICAL HISTORY: Past Surgical History:  Procedure Laterality Date  . BIOPSY  01/05/2019   Procedure: BIOPSY;  Surgeon: Milus Banister, MD;  Location: [facility];  Service: Endoscopy;;  . ESOPHAGOGASTRODUODENOSCOPY (EGD) WITH PROPOFOL N/A 01/05/2019   Procedure: ESOPHAGOGASTRODUODENOSCOPY (EGD) WITH PROPOFOL;  Surgeon: Milus Banister, MD;  Location: [facility];  Service: Endoscopy;  Laterality: N/A;  . IR GASTROSTOMY TUBE MOD SED  11/22/2018  . ORCHIECTOMY Right    Benign    I have reviewed the social history and family history with the patient and they are unchanged from previous note.  ALLERGIES:  has No Known Allergies.  MEDICATIONS:  Current Outpatient Medications   Medication Sig Dispense Refill  . allopurinol (ZYLOPRIM) 100 MG tablet Take 100 mg by mouth daily.    Marland Kitchen amLODipine (NORVASC) 5 MG tablet Take 5 mg by mouth daily.    Marland Kitchen atorvastatin (LIPITOR) 40 MG tablet Take 40 mg by mouth at bedtime.     Marland Kitchen glimepiride (AMARYL) 4 MG tablet Take 4 mg by mouth daily.    Marland Kitchen lidocaine-prilocaine (EMLA) cream Apply 1 application topically as needed. (Patient taking differently: Apply 1 application topically as needed (port). ) 30 g 0  . Nutritional Supplements (FEEDING SUPPLEMENT, GLUCERNA 1.5 CAL,) LIQD Give 2 cartons of formula at 7am and 11am,and 1 carton at 3pm and 7pm.   Flush with 157m water before and after each feeding. 1422 mL 5  . pantoprazole sodium (PROTONIX) 40 mg/20 mL PACK Place 20 mLs (40 mg total) into feeding tube 2 (two) times daily. 1200 mL 2  . pioglitazone (ACTOS) 45 MG tablet Take 45 mg by mouth daily.    . potassium chloride (KLOR-CON) 20 MEQ packet PLACE 20 MEQ INTO FEEDING TUBE DAILY AS DIRECTED 30 packet 1  . promethazine (PHENERGAN) 6.25 MG/5ML syrup Take 10 mLs (12.5 mg total) by mouth every 6 (six) hours as needed for nausea or vomiting. 120 mL 1  . Travoprost, BAK Free, (TRAVATAN) 0.004 % SOLN ophthalmic solution Place 1 drop into both eyes every evening.    . Insulin Glargine (LANTUS SOLOSTAR) 100 UNIT/ML Solostar Pen Inject 12 Units into the skin daily. 3.6 mL 2  . Insulin Pen Needle 31G X 5 MM MISC  Use to administer lantus insulin once daily 90 each 0   No current facility-administered medications for this visit.    Facility-Administered Medications Ordered in Other Visits  Medication Dose Route Frequency Provider Last Rate Last Dose  . sodium chloride flush (NS) 0.9 % injection 10 mL  10 mL Intracatheter PRN Ladell Pier, MD   10 mL at 07/12/19 0933    PHYSICAL EXAMINATION:  Today's Vitals   07/12/19 0946  PainSc: 0-No pain   Per flow sheet BP 152/77, P 88, Temp 98.5, R 18, Weight 173 lbs, BMI 24.82   GENERAL:alert,  no distress and comfortable SKIN: no obvious rash. Mild hyperpigmentation to palms  EYES: sclera clear OROPHARYNX: white coating to tongue. No ulcers  LUNGS: clear with normal breathing effort HEART: regular rate & rhythm, no lower extremity edema ABDOMEN: abdomen soft, non-tender and normal bowel sounds. G tube site open to upper abdomen, no drainage or erythema. Small serosanguinous drainage on dressing Musculoskeletal:no cyanosis of digits  NEURO: alert & oriented x 3 with fluent speech, mildly decreased vibratory sense to fingertips per tuning fork exam PAC without erythema   LABORATORY DATA:  I have reviewed the data as listed CBC Latest Ref Rng & Units 07/12/2019 06/21/2019 06/08/2019  WBC 4.0 - 10.5 K/uL 8.5 6.4 7.1  Hemoglobin 13.0 - 17.0 g/dL 9.4(L) 9.1(L) 9.2(L)  Hematocrit 39.0 - 52.0 % 29.5(L) 28.1(L) 28.4(L)  Platelets 150 - 400 K/uL 272 251 240     CMP Latest Ref Rng & Units 07/12/2019 06/21/2019 06/08/2019  Glucose 70 - 99 mg/dL 190(H) 170(H) 131(H)  BUN 8 - 23 mg/dL 38(H) 32(H) 27(H)  Creatinine 0.61 - 1.24 mg/dL 1.57(H) 1.45(H) 1.57(H)  Sodium 135 - 145 mmol/L 140 136 137  Potassium 3.5 - 5.1 mmol/L 3.9 4.3 4.0  Chloride 98 - 111 mmol/L 102 100 100  CO2 22 - 32 mmol/L 29 25 28   Calcium 8.9 - 10.3 mg/dL 9.4 9.2 9.3  Total Protein 6.5 - 8.1 g/dL 7.4 6.4(L) 6.6  Total Bilirubin 0.3 - 1.2 mg/dL 0.3 0.4 0.4  Alkaline Phos 38 - 126 U/L 185(H) 171(H) 166(H)  AST 15 - 41 U/L 49(H) 50(H) 23  ALT 0 - 44 U/L 50(H) 60(H) 23      RADIOGRAPHIC STUDIES: I have personally reviewed the radiological images as listed and agreed with the findings in the report. No results found.   ASSESSMENT & PLAN:  1. 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 Partially obstructing esophagus mass 02/01/2017, biopsy confirmed moderately differentiated adenocarcinoma ? Staging CTs 03/06/2017-esophagus mass, single noncalcified pulmonary nodule the medial aspect of  the left lower lobe, evidence of prior granulomatous disease, right adrenal adenoma ? PET scan 04/07/2017-hypermetabolic distal esophageal mass, no evidence of metastatic disease in the abdomen, pelvis, or skeleton. The right adrenal mass on CT was not hypermetabolic and favored to be an adenoma ? Radiation and concurrent weekly Taxol/carboplatin 04/20/2017-05/28/2017 ? CTs 07/05/2018-improvement in esophagus mass ? Endoscopy 07/26/2017-ulceration and friability from the GE junction up to 35 cm, 2 cm mass at the GE junction-improved in size, biopsy revealed no malignancy ? PET scan 08/19/2017-hypermetabolic activity at the distal esophagus and GE junction, no evidence of distant metastatic disease ? Admission to [facility] 11/18/2018 with a syncope event secondary to upper GI bleeding ? Upper endoscopy 11/18/2018-obstructing mass at 40 cm, biopsies revealed inflammation and no malignancy ? CTs 11/20/2018-10 mm nodule at the left lung apex, thickening at the distal esophagus, evidence  of prior granulomatous disease with calcified right lung nodules and right paratracheal/hilar lymph nodes ? Upper endoscopy 12/07/2026-pinpoint stricture in the distal esophagus, lumen was 1 mm. Gastroesophageal mucosa with exudate consistent with ulcer. No malignancy.  ? PET scan 12/19/2018-focal area of increased uptake at or just below the level of the GE junction; moderate FDG uptake associated with left apical lung nodule. Bilateral pleural effusions and lower lobe atelectasis. ? Endoscopy via gastrostomy tube tract 01/05/2019-mass at the gastric cardia/GE junction, biopsy confirmed adenocarcinoma, CPS-5,Her-2 3+ ? Cycle 1 FOLFOX 01/18/2019 ? Cycle 2 FOLFOX 02/02/2019 ? Cycle 3 FOLFOX 02/16/2019 ? Cycle 4 FOLFOX 03/02/2019 ? Cycle 5 FOLFOX 03/16/2019 ? CT chest6/01/2019-new cavitary change in the 1 cm left upper lobe pulmonary nodule, stable in size. Stable circumferential wall thickening of the lower  thoracic esophagus/GE junction, no progressive disease ? Cycle 6 FOLFOX 03/30/2019, Herceptin added ? Cycle 7 FOLFOX/Herceptin 04/13/2019 ? Cycle 8 FOLFOX/Herceptin 05/09/2019 ? Cycle 9 FOLFOX/Herceptin 05/25/2019 (oxaliplatin held due to neutropenia) ? Cycle 10 FOLFOX/Herceptin 06/08/2019 ? CT chest 06/21/2019- no evidence of metastatic disease.  Small cyst in the apical left upper lobe appears slightly less thick-walled than on prior exam.  Distal esophageal wall thickening as before.  Trace left pleural effusion. ? Cycle 11 5-FU/Herceptin 06/21/2019 ? Cycle 12 5-FU/Herceptin 07/12/2019  2. Upper GI bleeding secondary to #1, 11/18/2018-transfused with a total of 4 units of packed red blood cells 3. Nutrition-placement of gastrostomy feeding tube 11/22/2018  Feeding tube placed initial treatment in 2018, removed May 2019  Feeding tube fell out at home 07/12/19, site without signs of infection. Refer to IR for replacement   4.Diabetes 5.Hypertension 6.Gout 7.Chronic renal failure 8. Admission 04/27/2019 with severe hyperglycemia, weight loss, and dehydration 9. Anemia-acute drop in hemoglobin noted 04/27/2019-transfused 2 units of packed red blood cells on 04/28/2019 10. Peripheral neuropathy 07/12/2019, likely related to Oxaliplatin which was stopped after cycle 10 on 06/08/19. Has some difficulty with buttoning clothes but remains functional. Declined gabapentin. Will monitor closely  Disposition: Mr. Bardon appears stable. He completed 11 cycles of treatment, most recently 5FU/Herceptin on 06/21/19. He is tolerating treatment moderately well with stable fatigue and neuropathy. He remains functional. We discussed medication therapy such as gabapentin which he declined today. Will continue to monitor. He no longer receives Oxaliplatin.   Labs reviewed, CBC and CMP are stable. OK to proceed with treatment with elevated creatinine and AST/ALT. He will be referred for repeat echo. He will  return for lab, follow up, and 5FU/herceptin in 3 weeks.   His G tube fell out this morning. He relies on this as his primary means of nutrition, hydration, and medication. We will refer him urgently to IR for replacement. Site is without signs of infection.   I discussed the plan with Dr. Benay Spice today.   Orders Placed This Encounter  Procedures  . ECHOCARDIOGRAM COMPLETE    Standing Status:   Future    Standing Expiration Date:   10/10/2020    Order Specific Question:   Where should this test be performed    Answer:   [place]    Order Specific Question:   Perflutren DEFINITY (image enhancing agent) should be administered unless hypersensitivity or allergy exist    Answer:   Administer Perflutren    Order Specific Question:   Reason for exam-Echo    Answer:   Chemotherapy evaluation  v87.41 / v58.11    Order Specific Question:   Other Comments    Answer:  on anti-HER2 therapy for esophageal cancer   All questions were answered. The patient knows to call the clinic with any problems, questions or concerns. No barriers to learning was detected.     Alla Feeling, NP 07/12/19

## 2019-07-12 ENCOUNTER — Other Ambulatory Visit (HOSPITAL_COMMUNITY): Payer: Self-pay | Admitting: Oncology

## 2019-07-12 ENCOUNTER — Inpatient Hospital Stay: Payer: Medicare Other | Admitting: Nutrition

## 2019-07-12 ENCOUNTER — Ambulatory Visit (HOSPITAL_COMMUNITY)
Admission: RE | Admit: 2019-07-12 | Discharge: 2019-07-12 | Disposition: A | Discharge status: Home / Self Care | Payer: Medicare Other | Source: Ambulatory Visit | Attending: Oncology | Admitting: Oncology

## 2019-07-12 ENCOUNTER — Other Ambulatory Visit: Payer: Self-pay

## 2019-07-12 ENCOUNTER — Inpatient Hospital Stay: Payer: Medicare Other

## 2019-07-12 ENCOUNTER — Inpatient Hospital Stay: Payer: Medicare Other | Attending: Oncology | Admitting: Nurse Practitioner

## 2019-07-12 ENCOUNTER — Encounter: Payer: Self-pay | Admitting: Nurse Practitioner

## 2019-07-12 VITALS — BP 152/77 | HR 88 | Temp 98.5°F | Resp 18 | Wt 173.0 lb

## 2019-07-12 DIAGNOSIS — I10 Essential (primary) hypertension: Secondary | ICD-10-CM

## 2019-07-12 DIAGNOSIS — Z5111 Encounter for antineoplastic chemotherapy: Secondary | ICD-10-CM | POA: Insufficient documentation

## 2019-07-12 DIAGNOSIS — C155 Malignant neoplasm of lower third of esophagus: Secondary | ICD-10-CM | POA: Diagnosis present

## 2019-07-12 DIAGNOSIS — Z431 Encounter for attention to gastrostomy: Secondary | ICD-10-CM | POA: Diagnosis not present

## 2019-07-12 DIAGNOSIS — Z5112 Encounter for antineoplastic immunotherapy: Secondary | ICD-10-CM | POA: Insufficient documentation

## 2019-07-12 DIAGNOSIS — R1310 Dysphagia, unspecified: Secondary | ICD-10-CM | POA: Diagnosis not present

## 2019-07-12 DIAGNOSIS — Z95828 Presence of other vascular implants and grafts: Secondary | ICD-10-CM

## 2019-07-12 HISTORY — PX: IR REPLC GASTRO/COLONIC TUBE PERCUT W/FLUORO: IMG2333

## 2019-07-12 LAB — CMP (CANCER CENTER ONLY)
ALT: 50 U/L — ABNORMAL HIGH (ref 0–44)
AST: 49 U/L — ABNORMAL HIGH (ref 15–41)
Albumin: 3.3 g/dL — ABNORMAL LOW (ref 3.5–5.0)
Alkaline Phosphatase: 185 U/L — ABNORMAL HIGH (ref 38–126)
Anion gap: 9 (ref 5–15)
BUN: 38 mg/dL — ABNORMAL HIGH (ref 8–23)
CO2: 29 mmol/L (ref 22–32)
Calcium: 9.4 mg/dL (ref 8.9–10.3)
Chloride: 102 mmol/L (ref 98–111)
Creatinine: 1.57 mg/dL — ABNORMAL HIGH (ref 0.61–1.24)
GFR, Est AFR Am: 51 mL/min — ABNORMAL LOW (ref >60)
GFR, Estimated: 44 mL/min — ABNORMAL LOW (ref >60)
Glucose, Bld: 190 mg/dL — ABNORMAL HIGH (ref 70–99)
Potassium: 3.9 mmol/L (ref 3.5–5.1)
Sodium: 140 mmol/L (ref 135–145)
Total Bilirubin: 0.3 mg/dL (ref 0.3–1.2)
Total Protein: 7.4 g/dL (ref 6.5–8.1)

## 2019-07-12 LAB — CBC WITH DIFFERENTIAL (CANCER CENTER ONLY)
Abs Immature Granulocytes: 0.05 10*3/uL (ref 0.00–0.07)
Basophils Absolute: 0 10*3/uL (ref 0.0–0.1)
Basophils Relative: 0 %
Eosinophils Absolute: 0.1 10*3/uL (ref 0.0–0.5)
Eosinophils Relative: 1 %
HCT: 29.5 % — ABNORMAL LOW (ref 39.0–52.0)
Hemoglobin: 9.4 g/dL — ABNORMAL LOW (ref 13.0–17.0)
Immature Granulocytes: 1 %
Lymphocytes Relative: 8 %
Lymphs Abs: 0.7 10*3/uL (ref 0.7–4.0)
MCH: 31.1 pg (ref 26.0–34.0)
MCHC: 31.9 g/dL (ref 30.0–36.0)
MCV: 97.7 fL (ref 80.0–100.0)
Monocytes Absolute: 1.7 10*3/uL — ABNORMAL HIGH (ref 0.1–1.0)
Monocytes Relative: 20 %
Neutro Abs: 5.9 10*3/uL (ref 1.7–7.7)
Neutrophils Relative %: 70 %
Platelet Count: 272 10*3/uL (ref 150–400)
RBC: 3.02 MIL/uL — ABNORMAL LOW (ref 4.22–5.81)
RDW: 16.4 % — ABNORMAL HIGH (ref 11.5–15.5)
WBC Count: 8.5 10*3/uL (ref 4.0–10.5)
nRBC: 0 % (ref 0.0–0.2)

## 2019-07-12 MED ORDER — HEPARIN SOD (PORK) LOCK FLUSH 100 UNIT/ML IV SOLN
500.0000 [IU] | Freq: Once | INTRAVENOUS | Status: DC | PRN
  Filled 2019-07-12: qty 5

## 2019-07-12 MED ORDER — DIPHENHYDRAMINE HCL 12.5 MG/5ML PO ELIX
25.0000 mg | ORAL_SOLUTION | Freq: Once | ORAL | Status: AC
  Administered 2019-07-12: 14:00:00 25 mg via ORAL

## 2019-07-12 MED ORDER — SODIUM CHLORIDE 0.9% FLUSH
10.0000 mL | INTRAVENOUS | Status: DC | PRN
  Administered 2019-07-12: 10:00:00 10 mL
  Filled 2019-07-12: qty 10

## 2019-07-12 MED ORDER — LIDOCAINE VISCOUS HCL 2 % MT SOLN
OROMUCOSAL | Status: AC
  Filled 2019-07-12: qty 15

## 2019-07-12 MED ORDER — ACETAMINOPHEN 160 MG/5ML PO SOLN
650.0000 mg | Freq: Once | ORAL | Status: AC
  Administered 2019-07-12: 650 mg via ORAL

## 2019-07-12 MED ORDER — FLUOROURACIL CHEMO INJECTION 2.5 GM/50ML
400.0000 mg/m2 | Freq: Once | INTRAVENOUS | Status: AC
  Administered 2019-07-12: 800 mg via INTRAVENOUS
  Filled 2019-07-12: qty 16

## 2019-07-12 MED ORDER — DIPHENHYDRAMINE HCL 12.5 MG/5ML PO ELIX
ORAL_SOLUTION | ORAL | Status: AC
  Filled 2019-07-12: qty 5

## 2019-07-12 MED ORDER — IOHEXOL 300 MG/ML  SOLN
50.0000 mL | Freq: Once | INTRAMUSCULAR | Status: AC | PRN
  Administered 2019-07-12: 13:00:00 20 mL

## 2019-07-12 MED ORDER — LIDOCAINE HCL 1 % IJ SOLN
INTRAMUSCULAR | Status: AC
  Filled 2019-07-12: qty 20

## 2019-07-12 MED ORDER — ACETAMINOPHEN 160 MG/5ML PO SOLN
ORAL | Status: AC
  Filled 2019-07-12: qty 20.3

## 2019-07-12 MED ORDER — TRASTUZUMAB-DKST CHEMO 150 MG IV SOLR
300.0000 mg | Freq: Once | INTRAVENOUS | Status: AC
  Administered 2019-07-12: 15:00:00 300 mg via INTRAVENOUS
  Filled 2019-07-12: qty 14.29

## 2019-07-12 MED ORDER — SODIUM CHLORIDE 0.9% FLUSH
10.0000 mL | INTRAVENOUS | Status: DC | PRN
  Filled 2019-07-12: qty 10

## 2019-07-12 MED ORDER — LIDOCAINE HCL 1 % IJ SOLN
INTRAMUSCULAR | Status: DC | PRN
  Administered 2019-07-12: 7 mL

## 2019-07-12 MED ORDER — LEUCOVORIN CALCIUM INJECTION 350 MG
400.0000 mg/m2 | Freq: Once | INTRAVENOUS | Status: AC
  Administered 2019-07-12: 788 mg via INTRAVENOUS
  Filled 2019-07-12: qty 39.4

## 2019-07-12 MED ORDER — SODIUM CHLORIDE 0.9 % IV SOLN
Freq: Once | INTRAVENOUS | Status: AC
  Administered 2019-07-12: 14:00:00 via INTRAVENOUS
  Filled 2019-07-12: qty 250

## 2019-07-12 MED ORDER — SODIUM CHLORIDE 0.9 % IV SOLN
2400.0000 mg/m2 | INTRAVENOUS | Status: DC
  Administered 2019-07-12: 4750 mg via INTRAVENOUS
  Filled 2019-07-12: qty 95

## 2019-07-12 NOTE — Progress Notes (Signed)
Nutrition follow-up completed with patient during infusion for gastric mass/esophageal cancer. Patient's feeding tube fell out this morning and was replaced in interventional radiology today. Patient has been tolerating 6 bottles Glucerna 1.5 daily.  He flushes his feeding tube with 120 mL of water before and after each feeding. Denies problems or tolerance with tube feeding. Noted weight 173 pounds September 16, stable Noted labs: Glucose 190, BUN 38, creatinine 1.57, albumin 3.3.  Estimated nutrition needs: 2000-2200 cal, 95-110 g protein, 2.0 L fluid.  Nutrition diagnosis inadequate oral intake continues.  Intervention: Continue Glucerna 1.5, 6 cartons daily with 120 mL free water before and after each bolus feeding.  Continue water by mouth as tolerated. Tube feeding provides 2136 cal, 117 g protein, and 2040 mL of water including formula and free water flushes.  Monitoring, evaluation, goals: Patient will tolerate tube feeding at goal rate for weight maintenance.  Patient is stable on tube feeding.  He has my contact information for questions or concerns.  We will continue to follow as needed.  **Disclaimer: This note was dictated with voice recognition software. Similar sounding words can inadvertently be transcribed and this note may contain transcription errors which may not have been corrected upon publication of note.**

## 2019-07-12 NOTE — Patient Instructions (Signed)
[facility] Discharge Instructions for Patients Receiving Chemotherapy  Today you received the following chemotherapy agents Trastuzumab, Leucovorin, Flourouracil (ADRUCIL).  To help prevent nausea and vomiting after your treatment, we encourage you to take your nausea medication as prescribed.   If you develop nausea and vomiting that is not controlled by your nausea medication, call the clinic.   BELOW ARE SYMPTOMS THAT SHOULD BE REPORTED IMMEDIATELY:  *FEVER GREATER THAN 100.5 F  *CHILLS WITH OR WITHOUT FEVER  NAUSEA AND VOMITING THAT IS NOT CONTROLLED WITH YOUR NAUSEA MEDICATION  *UNUSUAL SHORTNESS OF BREATH  *UNUSUAL BRUISING OR BLEEDING  TENDERNESS IN MOUTH AND THROAT WITH OR WITHOUT PRESENCE OF ULCERS  *URINARY PROBLEMS  *BOWEL PROBLEMS  UNUSUAL RASH Items with * indicate a potential emergency and should be followed up as soon as possible.  Feel free to call the clinic should you have any questions or concerns. The clinic phone number is [phone].  Please show the [place] at check-in to the Emergency Department and triage nurse.

## 2019-07-12 NOTE — Procedures (Signed)
Interventional Radiology Procedure Note  Procedure: G-tube replacement  Complications: None immediate  Estimated Blood Loss: <10 mL  Recommendations: G-tube is ready for immediate use. Routine exchange every 6 months is recommended.   Signed,  Constance Holster, MD

## 2019-07-13 ENCOUNTER — Telehealth: Payer: Self-pay | Admitting: Nurse Practitioner

## 2019-07-13 NOTE — Telephone Encounter (Signed)
Scheduled appt per 9/16 los.  Patient aware of appt date and time.

## 2019-07-25 ENCOUNTER — Ambulatory Visit (HOSPITAL_BASED_OUTPATIENT_CLINIC_OR_DEPARTMENT_OTHER): Payer: Medicare Other | Admitting: Medical

## 2019-07-25 DIAGNOSIS — G620 Drug-induced polyneuropathy: Secondary | ICD-10-CM

## 2019-07-26 ENCOUNTER — Other Ambulatory Visit: Payer: Self-pay

## 2019-07-26 ENCOUNTER — Inpatient Hospital Stay: Payer: Medicare Other | Admitting: Medical

## 2019-07-26 ENCOUNTER — Ambulatory Visit (HOSPITAL_COMMUNITY)
Admission: RE | Admit: 2019-07-26 | Discharge: 2019-07-26 | Disposition: A | Discharge status: Home / Self Care | Payer: Medicare Other | Source: Ambulatory Visit | Attending: Nurse Practitioner | Admitting: Nurse Practitioner

## 2019-07-26 ENCOUNTER — Other Ambulatory Visit: Payer: Self-pay | Admitting: Medical

## 2019-07-26 DIAGNOSIS — I313 Pericardial effusion (noninflammatory): Secondary | ICD-10-CM | POA: Insufficient documentation

## 2019-07-26 DIAGNOSIS — I10 Essential (primary) hypertension: Secondary | ICD-10-CM | POA: Diagnosis not present

## 2019-07-26 DIAGNOSIS — G620 Drug-induced polyneuropathy: Secondary | ICD-10-CM

## 2019-07-26 MED ORDER — GABAPENTIN 300 MG PO CAPS: 300.0000 mg | ORAL_CAPSULE | Freq: Every day | ORAL | 5 refills | Status: DC

## 2019-07-26 NOTE — Progress Notes (Signed)
  Echocardiogram 2D Echocardiogram has been performed.  Lance Herring 07/26/2019, 11:03 AM

## 2019-07-28 NOTE — Progress Notes (Signed)
Erroneous encounter

## 2019-07-28 NOTE — Progress Notes (Signed)
Mr. Claydon was seen at the front of our office today.  This provider was called upfront to see the patient after he reported foot pain.  On further questioning the patient reported that he has been having numbness and tingling in his hands and feet.  Based on this he was given a prescription for Neurontin 300 mg p.o. nightly.  Sandi Mealy, MHS, PA-C Physician Assistant

## 2019-07-30 ENCOUNTER — Other Ambulatory Visit: Payer: Self-pay | Admitting: Oncology

## 2019-08-02 ENCOUNTER — Other Ambulatory Visit: Payer: Self-pay | Admitting: *Deleted

## 2019-08-02 DIAGNOSIS — C155 Malignant neoplasm of lower third of esophagus: Secondary | ICD-10-CM

## 2019-08-03 ENCOUNTER — Inpatient Hospital Stay (HOSPITAL_BASED_OUTPATIENT_CLINIC_OR_DEPARTMENT_OTHER): Payer: Medicare Other | Admitting: Oncology

## 2019-08-03 ENCOUNTER — Inpatient Hospital Stay: Payer: Medicare Other

## 2019-08-03 ENCOUNTER — Other Ambulatory Visit: Payer: Self-pay

## 2019-08-03 ENCOUNTER — Inpatient Hospital Stay: Payer: Medicare Other | Attending: Oncology

## 2019-08-03 VITALS — BP 152/73 | HR 93 | Temp 98.9°F | Resp 18 | Ht 70.0 in | Wt 173.4 lb

## 2019-08-03 DIAGNOSIS — C155 Malignant neoplasm of lower third of esophagus: Secondary | ICD-10-CM

## 2019-08-03 DIAGNOSIS — Z95828 Presence of other vascular implants and grafts: Secondary | ICD-10-CM

## 2019-08-03 DIAGNOSIS — C160 Malignant neoplasm of cardia: Secondary | ICD-10-CM | POA: Diagnosis not present

## 2019-08-03 DIAGNOSIS — Z5111 Encounter for antineoplastic chemotherapy: Secondary | ICD-10-CM | POA: Insufficient documentation

## 2019-08-03 DIAGNOSIS — Z5112 Encounter for antineoplastic immunotherapy: Secondary | ICD-10-CM | POA: Insufficient documentation

## 2019-08-03 LAB — CBC WITH DIFFERENTIAL (CANCER CENTER ONLY)
Abs Immature Granulocytes: 0.07 10*3/uL (ref 0.00–0.07)
Basophils Absolute: 0 10*3/uL (ref 0.0–0.1)
Basophils Relative: 0 %
Eosinophils Absolute: 0.1 10*3/uL (ref 0.0–0.5)
Eosinophils Relative: 1 %
HCT: 29.9 % — ABNORMAL LOW (ref 39.0–52.0)
Hemoglobin: 9.6 g/dL — ABNORMAL LOW (ref 13.0–17.0)
Immature Granulocytes: 1 %
Lymphocytes Relative: 8 %
Lymphs Abs: 0.7 10*3/uL (ref 0.7–4.0)
MCH: 30.6 pg (ref 26.0–34.0)
MCHC: 32.1 g/dL (ref 30.0–36.0)
MCV: 95.2 fL (ref 80.0–100.0)
Monocytes Absolute: 1.4 10*3/uL — ABNORMAL HIGH (ref 0.1–1.0)
Monocytes Relative: 16 %
Neutro Abs: 6.7 10*3/uL (ref 1.7–7.7)
Neutrophils Relative %: 74 %
Platelet Count: 279 10*3/uL (ref 150–400)
RBC: 3.14 MIL/uL — ABNORMAL LOW (ref 4.22–5.81)
RDW: 16.7 % — ABNORMAL HIGH (ref 11.5–15.5)
WBC Count: 9 10*3/uL (ref 4.0–10.5)
nRBC: 0.2 % (ref 0.0–0.2)

## 2019-08-03 LAB — CMP (CANCER CENTER ONLY)
ALT: 160 U/L — ABNORMAL HIGH (ref 0–44)
AST: 111 U/L — ABNORMAL HIGH (ref 15–41)
Albumin: 3 g/dL — ABNORMAL LOW (ref 3.5–5.0)
Alkaline Phosphatase: 214 U/L — ABNORMAL HIGH (ref 38–126)
Anion gap: 9 (ref 5–15)
BUN: 35 mg/dL — ABNORMAL HIGH (ref 8–23)
CO2: 29 mmol/L (ref 22–32)
Calcium: 9.5 mg/dL (ref 8.9–10.3)
Chloride: 104 mmol/L (ref 98–111)
Creatinine: 1.52 mg/dL — ABNORMAL HIGH (ref 0.61–1.24)
GFR, Est AFR Am: 53 mL/min — ABNORMAL LOW (ref >60)
GFR, Estimated: 45 mL/min — ABNORMAL LOW (ref >60)
Glucose, Bld: 174 mg/dL — ABNORMAL HIGH (ref 70–99)
Potassium: 3.7 mmol/L (ref 3.5–5.1)
Sodium: 142 mmol/L (ref 135–145)
Total Bilirubin: 0.3 mg/dL (ref 0.3–1.2)
Total Protein: 7.5 g/dL (ref 6.5–8.1)

## 2019-08-03 MED ORDER — TRASTUZUMAB-DKST CHEMO 150 MG IV SOLR
450.0000 mg | Freq: Once | INTRAVENOUS | Status: AC
  Administered 2019-08-03: 14:00:00 450 mg via INTRAVENOUS
  Filled 2019-08-03: qty 21.43

## 2019-08-03 MED ORDER — SODIUM CHLORIDE 0.9 % IV SOLN
Freq: Once | INTRAVENOUS | Status: AC
  Administered 2019-08-03: 13:00:00 via INTRAVENOUS
  Filled 2019-08-03: qty 250

## 2019-08-03 MED ORDER — DIPHENHYDRAMINE HCL 50 MG/ML IJ SOLN
INTRAMUSCULAR | Status: AC
  Filled 2019-08-03: qty 1

## 2019-08-03 MED ORDER — ACETAMINOPHEN 160 MG/5ML PO SOLN: 650.0000 mg | Freq: Once | ORAL | Status: DC

## 2019-08-03 MED ORDER — DIPHENHYDRAMINE HCL 50 MG/ML IJ SOLN
25.0000 mg | Freq: Once | INTRAMUSCULAR | Status: AC
  Administered 2019-08-03: 13:00:00 25 mg via INTRAVENOUS

## 2019-08-03 MED ORDER — DIPHENHYDRAMINE HCL 12.5 MG/5ML PO ELIX
ORAL_SOLUTION | ORAL | Status: AC
  Filled 2019-08-03: qty 10

## 2019-08-03 MED ORDER — ACETAMINOPHEN 325 MG PO TABS
ORAL_TABLET | ORAL | Status: AC
  Filled 2019-08-03: qty 2

## 2019-08-03 MED ORDER — ACETAMINOPHEN 160 MG/5ML PO SOLN
ORAL | Status: AC
  Filled 2019-08-03: qty 20.3

## 2019-08-03 MED ORDER — SODIUM CHLORIDE 0.9 % IV SOLN
2400.0000 mg/m2 | INTRAVENOUS | Status: DC
  Administered 2019-08-03: 17:00:00 4750 mg via INTRAVENOUS
  Filled 2019-08-03: qty 95

## 2019-08-03 MED ORDER — FLUOROURACIL CHEMO INJECTION 2.5 GM/50ML
400.0000 mg/m2 | Freq: Once | INTRAVENOUS | Status: AC
  Administered 2019-08-03: 800 mg via INTRAVENOUS
  Filled 2019-08-03: qty 16

## 2019-08-03 MED ORDER — LEUCOVORIN CALCIUM INJECTION 350 MG
400.0000 mg/m2 | Freq: Once | INTRAVENOUS | Status: AC
  Administered 2019-08-03: 788 mg via INTRAVENOUS
  Filled 2019-08-03: qty 39.4

## 2019-08-03 MED ORDER — SODIUM CHLORIDE 0.9% FLUSH
10.0000 mL | INTRAVENOUS | Status: DC | PRN
  Administered 2019-08-03: 11:00:00 10 mL
  Filled 2019-08-03: qty 10

## 2019-08-03 MED ORDER — SODIUM CHLORIDE 0.9 % IV SOLN
INTRAVENOUS | Status: DC
  Filled 2019-08-03: qty 250

## 2019-08-03 MED ORDER — DIPHENHYDRAMINE HCL 12.5 MG/5ML PO ELIX: 25.0000 mg | ORAL_SOLUTION | Freq: Once | ORAL | Status: DC

## 2019-08-03 NOTE — Progress Notes (Signed)
Lance Herring   Diagnosis: Gastroesophageal cancer  INTERVAL HISTORY:   Lance Herring completed another cycle of 5-FU and Herceptin on 07/12/2019.  No nausea or diarrhea.  He is tolerating liquids.  He continues tube feedings.  He has developed swelling and discomfort at the right foot and right great toe for the past several weeks.  He is taking allopurinol.  Objective:  Vital signs in last 24 hours:  Blood pressure (!) 152/73, pulse 93, temperature 98.9 F (37.2 C), temperature source Temporal, resp. rate 18, height 5' 10"  (1.778 m), weight 173 lb 6.4 oz (78.7 kg), SpO2 99 %.     Resp: Lungs clear bilaterally Cardio: Regular rate and rhythm GI: No hepatomegaly, upper abdomen gastrostomy tube Vascular: No leg edema Musculoskeletal: Mild tenderness and pain with motion at the right great toe, mild induration at the distal dorsum of the right foot  Portacath/PICC-without erythema  Lab Results:  Lab Results  Component Value Date   WBC 9.0 08/03/2019   HGB 9.6 (L) 08/03/2019   HCT 29.9 (L) 08/03/2019   MCV 95.2 08/03/2019   PLT 279 08/03/2019   NEUTROABS 6.7 08/03/2019    CMP  Lab Results  Component Value Date   NA 142 08/03/2019   K 3.7 08/03/2019   CL 104 08/03/2019   CO2 29 08/03/2019   GLUCOSE 174 (H) 08/03/2019   BUN 35 (H) 08/03/2019   CREATININE 1.52 (H) 08/03/2019   CALCIUM 9.5 08/03/2019   PROT 7.5 08/03/2019   ALBUMIN 3.0 (L) 08/03/2019   AST 111 (H) 08/03/2019   ALT 160 (H) 08/03/2019   ALKPHOS 214 (H) 08/03/2019   BILITOT 0.3 08/03/2019   GFRNONAA 45 (L) 08/03/2019   GFRAA 53 (L) 08/03/2019    Lab Results  Component Value Date   CEA1 5.10 (H) 01/17/2019    Medications: I have reviewed the patient's current medications.   Assessment/Plan: 1. 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 Partially obstructing esophagus mass 02/01/2017, biopsy confirmed moderately differentiated  adenocarcinoma ? Staging CTs 03/06/2017-esophagus mass, single noncalcified pulmonary nodule the medial aspect of the left lower lobe, evidence of prior granulomatous disease, right adrenal adenoma ? PET scan 04/07/2017-hypermetabolic distal esophageal mass, no evidence of metastatic disease in the abdomen, pelvis, or skeleton. The right adrenal mass on CT was not hypermetabolic and favored to be an adenoma ? Radiation and concurrent weekly Taxol/carboplatin 04/20/2017-05/28/2017 ? CTs 07/05/2018-improvement in esophagus mass ? Endoscopy 07/26/2017-ulceration and friability from the GE junction up to 35 cm, 2 cm mass at the GE junction-improved in size, biopsy revealed no malignancy ? PET scan 08/19/2017-hypermetabolic activity at the distal esophagus and GE junction, no evidence of distant metastatic disease ? Admission to [facility] 11/18/2018 with a syncope event secondary to upper GI bleeding ? Upper endoscopy 11/18/2018-obstructing mass at 40 cm, biopsies revealed inflammation and no malignancy ? CTs 11/20/2018-10 mm nodule at the left lung apex, thickening at the distal esophagus, evidence of prior granulomatous disease with calcified right lung nodules and right paratracheal/hilar lymph nodes ? Upper endoscopy 12/07/2026-pinpoint stricture in the distal esophagus, lumen was 1 mm. Gastroesophageal mucosa with exudate consistent with ulcer. No malignancy.  ? PET scan 12/19/2018-focal area of increased uptake at or just below the level of the GE junction; moderate FDG uptake associated with left apical lung nodule. Bilateral pleural effusions and lower lobe atelectasis. ? Endoscopy via gastrostomy tube tract 01/05/2019-mass at the gastric cardia/GE junction, biopsy confirmed adenocarcinoma,  CPS-5,Her-2 3+ ? Cycle 1 FOLFOX 01/18/2019 ? Cycle 2 FOLFOX 02/02/2019 ? Cycle 3 FOLFOX 02/16/2019 ? Cycle 4 FOLFOX 03/02/2019 ? Cycle 5 FOLFOX 03/16/2019 ? CT chest6/01/2019-new cavitary change in the 1  cm left upper lobe pulmonary nodule, stable in size. Stable circumferential wall thickening of the lower thoracic esophagus/GE junction, no progressive disease ? Cycle 6 FOLFOX 03/30/2019, Herceptin added ? Cycle 7 FOLFOX/Herceptin 04/13/2019 ? Cycle 8 FOLFOX/Herceptin 05/09/2019 ? Cycle 9 FOLFOX/Herceptin 05/25/2019 (oxaliplatin held due to neutropenia) ? Cycle 10 FOLFOX/Herceptin 06/08/2019 ? CT chest 06/21/2019- no evidence of metastatic disease.  Small cyst in the apical left upper lobe appears slightly less thick-walled than on prior exam.  Distal esophageal wall thickening as before.  Trace left pleural effusion. ? Cycle 11 5-FU/Herceptin 06/21/2019 ? Cycle 12 5-FU/Herceptin 07/12/2019 ? Cycle 13 5-FU/Herceptin 08/03/2019 2. Upper GI bleeding secondary to #1, 11/18/2018-transfused with a total of 4 units of packed red blood cells 3. Nutrition-placement of gastrostomy feeding tube 11/22/2018  Feeding tube placed initial treatment in 2018, removed May 2019  4.Diabetes 5.Hypertension 6.Gout 7.Chronic renal failure 8. Admission 04/27/2019 with severe hyperglycemia, weight loss, and dehydration 9. Anemia-acute drop in hemoglobin noted 04/27/2019-transfused 2 units of packed red blood cells on 04/28/2019     Disposition: Lance Herring continues 5-FU/Herceptin.  He is tolerating treatment well.  The liver enzymes are not elevated today.  This may be related to the oxaliplatin he received in the past.  He also takes Lipitor.  He will hold Lipitor. He appears to have a gout flare in the right great toe.  This is been going on for several weeks.  There is a relative contraindication to prednisone, colchicine, and nonsteroidal anti-inflammatory medications in his case.  He will use ice and try Tylenol.  I recommend he follow-up with his primary provider for management of the gout.  Lance Herring will return for an office visit and chemotherapy in 3 weeks.  Betsy Coder, MD  08/03/2019  12:25  PM

## 2019-08-03 NOTE — Patient Instructions (Signed)
Per Dr. Benay Spice: Regarding your gout Discontinue your Lipitor, apply ice to area few times/day and take Tylenol every 6 hours.

## 2019-08-03 NOTE — Progress Notes (Signed)
Patient unable to tolerate liquid PO tylenol and benadryl; pt began gagging and spit all medication out of his mouth immediately after trying to swallow it. Pt vomited minimal amount. Pharmacy consulted r/t switching to IV pre-medications.  Per Leron Croak, RPH: benadryl changed to IV; no tylenol option other than PO here.

## 2019-08-03 NOTE — Progress Notes (Signed)
Dr. Benay Spice has reviewed CBC and CMP today: OK to treat today.

## 2019-08-03 NOTE — Patient Instructions (Signed)
[facility] Discharge Instructions for Patients Receiving Chemotherapy  Today you received the following chemotherapy agents Trastuzumab, Leucovorin, Flourouracil (ADRUCIL).  To help prevent nausea and vomiting after your treatment, we encourage you to take your nausea medication as prescribed.   If you develop nausea and vomiting that is not controlled by your nausea medication, call the clinic.   BELOW ARE SYMPTOMS THAT SHOULD BE REPORTED IMMEDIATELY:  *FEVER GREATER THAN 100.5 F  *CHILLS WITH OR WITHOUT FEVER  NAUSEA AND VOMITING THAT IS NOT CONTROLLED WITH YOUR NAUSEA MEDICATION  *UNUSUAL SHORTNESS OF BREATH  *UNUSUAL BRUISING OR BLEEDING  TENDERNESS IN MOUTH AND THROAT WITH OR WITHOUT PRESENCE OF ULCERS  *URINARY PROBLEMS  *BOWEL PROBLEMS  UNUSUAL RASH Items with * indicate a potential emergency and should be followed up as soon as possible.  Feel free to call the clinic should you have any questions or concerns. The clinic phone number is [phone].  Please show the [place] at check-in to the Emergency Department and triage nurse.

## 2019-08-04 ENCOUNTER — Telehealth: Payer: Self-pay | Admitting: Oncology

## 2019-08-04 ENCOUNTER — Telehealth: Payer: Self-pay | Admitting: *Deleted

## 2019-08-04 NOTE — Telephone Encounter (Signed)
Call to [facility] with [facility] and informed them he will need nurse visit at 2:30pm on 08/05/19 for his pump d/c and port de-access.

## 2019-08-04 NOTE — Telephone Encounter (Signed)
Scheduled per los. Called and left a msg. Mailed printout

## 2019-08-10 ENCOUNTER — Other Ambulatory Visit: Payer: Self-pay | Admitting: Nurse Practitioner

## 2019-08-19 ENCOUNTER — Other Ambulatory Visit: Payer: Self-pay | Admitting: Oncology

## 2019-08-21 NOTE — Telephone Encounter (Signed)
Refill request

## 2019-08-24 ENCOUNTER — Inpatient Hospital Stay: Payer: Medicare Other

## 2019-08-24 ENCOUNTER — Encounter: Payer: Self-pay | Admitting: Nurse Practitioner

## 2019-08-24 ENCOUNTER — Inpatient Hospital Stay (HOSPITAL_BASED_OUTPATIENT_CLINIC_OR_DEPARTMENT_OTHER): Payer: Medicare Other | Admitting: Nurse Practitioner

## 2019-08-24 ENCOUNTER — Telehealth: Payer: Self-pay

## 2019-08-24 ENCOUNTER — Other Ambulatory Visit: Payer: Self-pay

## 2019-08-24 VITALS — BP 143/80 | HR 85 | Temp 98.3°F | Resp 17 | Ht 70.0 in | Wt 179.4 lb

## 2019-08-24 DIAGNOSIS — C155 Malignant neoplasm of lower third of esophagus: Secondary | ICD-10-CM

## 2019-08-24 DIAGNOSIS — Z95828 Presence of other vascular implants and grafts: Secondary | ICD-10-CM

## 2019-08-24 DIAGNOSIS — Z5112 Encounter for antineoplastic immunotherapy: Secondary | ICD-10-CM | POA: Diagnosis not present

## 2019-08-24 LAB — CBC WITH DIFFERENTIAL (CANCER CENTER ONLY)
Abs Immature Granulocytes: 0.03 10*3/uL (ref 0.00–0.07)
Basophils Absolute: 0 10*3/uL (ref 0.0–0.1)
Basophils Relative: 0 %
Eosinophils Absolute: 0.2 10*3/uL (ref 0.0–0.5)
Eosinophils Relative: 2 %
HCT: 30.8 % — ABNORMAL LOW (ref 39.0–52.0)
Hemoglobin: 9.9 g/dL — ABNORMAL LOW (ref 13.0–17.0)
Immature Granulocytes: 0 %
Lymphocytes Relative: 10 %
Lymphs Abs: 0.7 10*3/uL (ref 0.7–4.0)
MCH: 30.5 pg (ref 26.0–34.0)
MCHC: 32.1 g/dL (ref 30.0–36.0)
MCV: 94.8 fL (ref 80.0–100.0)
Monocytes Absolute: 1.2 10*3/uL — ABNORMAL HIGH (ref 0.1–1.0)
Monocytes Relative: 17 %
Neutro Abs: 4.8 10*3/uL (ref 1.7–7.7)
Neutrophils Relative %: 71 %
Platelet Count: 236 10*3/uL (ref 150–400)
RBC: 3.25 MIL/uL — ABNORMAL LOW (ref 4.22–5.81)
RDW: 18.5 % — ABNORMAL HIGH (ref 11.5–15.5)
WBC Count: 6.9 10*3/uL (ref 4.0–10.5)
nRBC: 0 % (ref 0.0–0.2)

## 2019-08-24 LAB — CMP (CANCER CENTER ONLY)
ALT: 33 U/L (ref 0–44)
AST: 28 U/L (ref 15–41)
Albumin: 3.3 g/dL — ABNORMAL LOW (ref 3.5–5.0)
Alkaline Phosphatase: 172 U/L — ABNORMAL HIGH (ref 38–126)
Anion gap: 9 (ref 5–15)
BUN: 37 mg/dL — ABNORMAL HIGH (ref 8–23)
CO2: 29 mmol/L (ref 22–32)
Calcium: 9.7 mg/dL (ref 8.9–10.3)
Chloride: 102 mmol/L (ref 98–111)
Creatinine: 1.58 mg/dL — ABNORMAL HIGH (ref 0.61–1.24)
GFR, Est AFR Am: 50 mL/min — ABNORMAL LOW (ref >60)
GFR, Estimated: 43 mL/min — ABNORMAL LOW (ref >60)
Glucose, Bld: 177 mg/dL — ABNORMAL HIGH (ref 70–99)
Potassium: 3.9 mmol/L (ref 3.5–5.1)
Sodium: 140 mmol/L (ref 135–145)
Total Bilirubin: 0.3 mg/dL (ref 0.3–1.2)
Total Protein: 7.3 g/dL (ref 6.5–8.1)

## 2019-08-24 LAB — CEA (IN HOUSE-CHCC): CEA (CHCC-In House): 4.62 ng/mL (ref 0.00–5.00)

## 2019-08-24 MED ORDER — ACETAMINOPHEN 160 MG/5ML PO SOLN
650.0000 mg | Freq: Once | ORAL | Status: AC
  Administered 2019-08-24: 12:00:00 650 mg via ORAL

## 2019-08-24 MED ORDER — ACETAMINOPHEN 160 MG/5ML PO SOLN
ORAL | Status: AC
  Filled 2019-08-24: qty 20.3

## 2019-08-24 MED ORDER — DIPHENHYDRAMINE HCL 50 MG/ML IJ SOLN
25.0000 mg | Freq: Once | INTRAMUSCULAR | Status: AC
  Administered 2019-08-24: 12:00:00 25 mg via INTRAVENOUS

## 2019-08-24 MED ORDER — DIPHENHYDRAMINE HCL 12.5 MG/5ML PO ELIX: 25.0000 mg | ORAL_SOLUTION | Freq: Once | ORAL | Status: DC

## 2019-08-24 MED ORDER — DIPHENHYDRAMINE HCL 50 MG/ML IJ SOLN
INTRAMUSCULAR | Status: AC
  Filled 2019-08-24: qty 1

## 2019-08-24 MED ORDER — FLUOROURACIL CHEMO INJECTION 2.5 GM/50ML
400.0000 mg/m2 | Freq: Once | INTRAVENOUS | Status: AC
  Administered 2019-08-24: 14:00:00 800 mg via INTRAVENOUS
  Filled 2019-08-24: qty 16

## 2019-08-24 MED ORDER — DIPHENHYDRAMINE HCL 12.5 MG/5ML PO ELIX
ORAL_SOLUTION | ORAL | Status: AC
  Filled 2019-08-24: qty 5

## 2019-08-24 MED ORDER — GABAPENTIN 100 MG PO CAPS: 100.0000 mg | ORAL_CAPSULE | Freq: Two times a day (BID) | ORAL | 1 refills | Status: DC

## 2019-08-24 MED ORDER — LEUCOVORIN CALCIUM INJECTION 350 MG
400.0000 mg/m2 | Freq: Once | INTRAVENOUS | Status: AC
  Administered 2019-08-24: 14:00:00 788 mg via INTRAVENOUS
  Filled 2019-08-24: qty 39.4

## 2019-08-24 MED ORDER — SODIUM CHLORIDE 0.9 % IV SOLN
2400.0000 mg/m2 | INTRAVENOUS | Status: DC
  Administered 2019-08-24: 14:00:00 4750 mg via INTRAVENOUS
  Filled 2019-08-24: qty 95

## 2019-08-24 MED ORDER — SODIUM CHLORIDE 0.9 % IV SOLN
Freq: Once | INTRAVENOUS | Status: AC
  Administered 2019-08-24: 12:00:00 via INTRAVENOUS
  Filled 2019-08-24: qty 250

## 2019-08-24 MED ORDER — SODIUM CHLORIDE 0.9% FLUSH
10.0000 mL | INTRAVENOUS | Status: DC | PRN
  Administered 2019-08-24: 10:00:00 10 mL
  Filled 2019-08-24: qty 10

## 2019-08-24 MED ORDER — TRASTUZUMAB-DKST CHEMO 150 MG IV SOLR
450.0000 mg | Freq: Once | INTRAVENOUS | Status: AC
  Administered 2019-08-24: 450 mg via INTRAVENOUS
  Filled 2019-08-24: qty 21.43

## 2019-08-24 NOTE — Patient Instructions (Signed)

## 2019-08-24 NOTE — Telephone Encounter (Signed)
Spoke with Lovena Le at [facility] [phone]) to inform them that patient's 5-FU pump will be ready to disconnect and patient's PAC to be de-accessed around 12:30 on Saturday 08/25/19. Lovena Le verbalized understanding and agreement. No other needs identified at this time

## 2019-08-24 NOTE — Progress Notes (Signed)
[facility] Her[facility] OFFICE PROGRESS NOTE   Diagnosis: Gastroesophageal cancer  INTERVAL HISTORY:   Lance Herring returns as scheduled.  He completed another cycle of 5-FU and Herceptin on 08/03/2019.  He denies nausea/vomiting.  No mouth sores.  No diarrhea.  No hand or foot pain or redness.  He has persistent numbness in the hands and feet.  He has noted some improvement in the pins/needles sensation since beginning gabapentin.  Objective:  Vital signs in last 24 hours:  Blood pressure (!) 143/80, pulse 85, temperature 98.3 F (36.8 C), temperature source Temporal, resp. rate 17, height 5' 10" (1.778 m), weight 179 lb 6.4 oz (81.4 kg), SpO2 100 %.    HEENT: White coating over tongue.  No buccal thrush.  No ulcers. GI: No hepatomegaly.  Left upper quadrant feeding tube site without evidence of infection. Vascular: No leg edema. Skin: Palms without erythema. Port-A-Cath without erythema.   Lab Results:  Lab Results  Component Value Date   WBC 6.9 08/24/2019   HGB 9.9 (L) 08/24/2019   HCT 30.8 (L) 08/24/2019   MCV 94.8 08/24/2019   PLT 236 08/24/2019   NEUTROABS 4.8 08/24/2019    Imaging:  No results found.  Medications: I have reviewed the patient's current medications.  Assessment/Plan: 1. 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 Partially obstructing esophagus mass 02/01/2017, biopsy confirmed moderately differentiated adenocarcinoma ? Staging CTs 03/06/2017-esophagus mass, single noncalcified pulmonary nodule the medial aspect of the left lower lobe, evidence of prior granulomatous disease, right adrenal adenoma ? PET scan 04/07/2017-hypermetabolic distal esophageal mass, no evidence of metastatic disease in the abdomen, pelvis, or skeleton. The right adrenal mass on CT was not hypermetabolic and favored to be an adenoma ? Radiation and concurrent weekly Taxol/carboplatin 04/20/2017-05/28/2017 ? CTs 07/05/2018-improvement in  esophagus mass ? Endoscopy 07/26/2017-ulceration and friability from the GE junction up to 35 cm, 2 cm mass at the GE junction-improved in size, biopsy revealed no malignancy ? PET scan 08/19/2017-hypermetabolic activity at the distal esophagus and GE junction, no evidence of distant metastatic disease ? Admission to [facility] 11/18/2018 with a syncope event secondary to upper GI bleeding ? Upper endoscopy 11/18/2018-obstructing mass at 40 cm, biopsies revealed inflammation and no malignancy ? CTs 11/20/2018-10 mm nodule at the left lung apex, thickening at the distal esophagus, evidence of prior granulomatous disease with calcified right lung nodules and right paratracheal/hilar lymph nodes ? Upper endoscopy 12/07/2026-pinpoint stricture in the distal esophagus, lumen was 1 mm. Gastroesophageal mucosa with exudate consistent with ulcer. No malignancy.  ? PET scan 12/19/2018-focal area of increased uptake at or just below the level of the GE junction; moderate FDG uptake associated with left apical lung nodule. Bilateral pleural effusions and lower lobe atelectasis. ? Endoscopy via gastrostomy tube tract 01/05/2019-mass at the gastric cardia/GE junction, biopsy confirmed adenocarcinoma, CPS-5,Her-2 3+ ? Cycle 1 FOLFOX 01/18/2019 ? Cycle 2 FOLFOX 02/02/2019 ? Cycle 3 FOLFOX 02/16/2019 ? Cycle 4 FOLFOX 03/02/2019 ? Cycle 5 FOLFOX 03/16/2019 ? CT chest6/01/2019-new cavitary change in the 1 cm left upper lobe pulmonary nodule, stable in size. Stable circumferential wall thickening of the lower thoracic esophagus/GE junction, no progressive disease ? Cycle 6 FOLFOX 03/30/2019, Herceptin added ? Cycle 7 FOLFOX/Herceptin 04/13/2019 ? Cycle 8 FOLFOX/Herceptin 05/09/2019 ? Cycle 9 FOLFOX/Herceptin 05/25/2019 (oxaliplatin held due to neutropenia) ? Cycle 10 FOLFOX/Herceptin 06/08/2019 ? CT chest 06/21/2019- no evidence of metastatic disease.  Small cyst in the apical left upper lobe appears slightly  less  thick-walled than on prior exam.  Distal esophageal wall thickening as before.  Trace left pleural effusion. ? Cycle 11 5-FU/Herceptin 06/21/2019 ? Cycle 12 5-FU/Herceptin 07/12/2019 ? Cycle 13 5-FU/Herceptin 08/03/2019 ? Cycle 14 5-FU/Herceptin 08/24/2019 2. Upper GI bleeding secondary to #1, 11/18/2018-transfused with a total of 4 units of packed red blood cells 3. Nutrition-placement of gastrostomy feeding tube 11/22/2018  Feeding tube placed initial treatment in 2018, removed May 2019  4.Diabetes 5.Hypertension 6.Gout 7.Chronic renal failure 8. Admission 04/27/2019 with severe hyperglycemia, weight loss, and dehydration 9. Anemia-acute drop in hemoglobin noted 04/27/2019-transfused 2 units of packed red blood cells on 04/28/2019    Disposition: Lance Herring appears stable.  He continues 5-FU/Herceptin every 3 weeks.  There is no clinical evidence of disease progression.  Plan to proceed with cycle 14 5-FU/Herceptin today as scheduled.  The plan is for restaging CTs at a 44-monthinterval.  We reviewed the CBC from today.  Counts adequate to proceed with treatment.  He has noted some improvement in the pins/needles sensation in the hands and feet since beginning gabapentin 300 mg at bedtime.  He will continue 300 mg at bedtime and begin 100 mg every morning and midday.  He was cautioned regarding sedation.  He will return for lab, follow-up, 5-FU/Herceptin in 3 weeks.  He will contact the office in the interim with any problems.  Plan reviewed with Dr. SBenay Spice  LNed CardANP/GNP-BC   08/24/2019  10:31 AM

## 2019-08-24 NOTE — Patient Instructions (Signed)
[facility] Discharge Instructions for Patients Receiving Chemotherapy  Today you received the following chemotherapy agents: trastuzumab, leucovorin, and 5FU.  To help prevent nausea and vomiting after your treatment, we encourage you to take your nausea medication as directed.   If you develop nausea and vomiting that is not controlled by your nausea medication, call the clinic.   BELOW ARE SYMPTOMS THAT SHOULD BE REPORTED IMMEDIATELY:  *FEVER GREATER THAN 100.5 F  *CHILLS WITH OR WITHOUT FEVER  NAUSEA AND VOMITING THAT IS NOT CONTROLLED WITH YOUR NAUSEA MEDICATION  *UNUSUAL SHORTNESS OF BREATH  *UNUSUAL BRUISING OR BLEEDING  TENDERNESS IN MOUTH AND THROAT WITH OR WITHOUT PRESENCE OF ULCERS  *URINARY PROBLEMS  *BOWEL PROBLEMS  UNUSUAL RASH Items with * indicate a potential emergency and should be followed up as soon as possible.  Feel free to call the clinic should you have any questions or concerns. The clinic phone number is [phone].  Please show the [facility] at check-in to the Emergency Department and triage nurse.

## 2019-08-24 NOTE — Progress Notes (Signed)
Per Lisa Christyl Osentoski NP, ok to treat with elevated creatinine. 

## 2019-08-26 ENCOUNTER — Inpatient Hospital Stay: Payer: Medicare Other

## 2019-09-10 ENCOUNTER — Other Ambulatory Visit: Payer: Self-pay | Admitting: Oncology

## 2019-09-14 ENCOUNTER — Inpatient Hospital Stay: Payer: Medicare Other

## 2019-09-14 ENCOUNTER — Inpatient Hospital Stay: Payer: Medicare Other | Attending: Oncology | Admitting: Oncology

## 2019-09-14 ENCOUNTER — Other Ambulatory Visit: Payer: Self-pay

## 2019-09-14 ENCOUNTER — Telehealth: Payer: Self-pay | Admitting: *Deleted

## 2019-09-14 VITALS — BP 156/71 | HR 86 | Temp 98.0°F | Resp 17 | Ht 70.0 in | Wt 179.6 lb

## 2019-09-14 DIAGNOSIS — Z5111 Encounter for antineoplastic chemotherapy: Secondary | ICD-10-CM | POA: Diagnosis present

## 2019-09-14 DIAGNOSIS — Z5112 Encounter for antineoplastic immunotherapy: Secondary | ICD-10-CM | POA: Diagnosis present

## 2019-09-14 DIAGNOSIS — C155 Malignant neoplasm of lower third of esophagus: Secondary | ICD-10-CM

## 2019-09-14 DIAGNOSIS — Z95828 Presence of other vascular implants and grafts: Secondary | ICD-10-CM

## 2019-09-14 LAB — CBC WITH DIFFERENTIAL (CANCER CENTER ONLY)
Abs Immature Granulocytes: 0.01 10*3/uL (ref 0.00–0.07)
Basophils Absolute: 0 10*3/uL (ref 0.0–0.1)
Basophils Relative: 1 %
Eosinophils Absolute: 0.1 10*3/uL (ref 0.0–0.5)
Eosinophils Relative: 2 %
HCT: 31.8 % — ABNORMAL LOW (ref 39.0–52.0)
Hemoglobin: 10 g/dL — ABNORMAL LOW (ref 13.0–17.0)
Immature Granulocytes: 0 %
Lymphocytes Relative: 13 %
Lymphs Abs: 0.8 10*3/uL (ref 0.7–4.0)
MCH: 30.2 pg (ref 26.0–34.0)
MCHC: 31.4 g/dL (ref 30.0–36.0)
MCV: 96.1 fL (ref 80.0–100.0)
Monocytes Absolute: 0.9 10*3/uL (ref 0.1–1.0)
Monocytes Relative: 15 %
Neutro Abs: 4.2 10*3/uL (ref 1.7–7.7)
Neutrophils Relative %: 69 %
Platelet Count: 263 10*3/uL (ref 150–400)
RBC: 3.31 MIL/uL — ABNORMAL LOW (ref 4.22–5.81)
RDW: 19 % — ABNORMAL HIGH (ref 11.5–15.5)
WBC Count: 6 10*3/uL (ref 4.0–10.5)
nRBC: 0 % (ref 0.0–0.2)

## 2019-09-14 LAB — CMP (CANCER CENTER ONLY)
ALT: 36 U/L (ref 0–44)
AST: 36 U/L (ref 15–41)
Albumin: 3.5 g/dL (ref 3.5–5.0)
Alkaline Phosphatase: 155 U/L — ABNORMAL HIGH (ref 38–126)
Anion gap: 12 (ref 5–15)
BUN: 40 mg/dL — ABNORMAL HIGH (ref 8–23)
CO2: 28 mmol/L (ref 22–32)
Calcium: 9.6 mg/dL (ref 8.9–10.3)
Chloride: 103 mmol/L (ref 98–111)
Creatinine: 1.67 mg/dL — ABNORMAL HIGH (ref 0.61–1.24)
GFR, Est AFR Am: 47 mL/min — ABNORMAL LOW (ref >60)
GFR, Estimated: 41 mL/min — ABNORMAL LOW (ref >60)
Glucose, Bld: 157 mg/dL — ABNORMAL HIGH (ref 70–99)
Potassium: 4 mmol/L (ref 3.5–5.1)
Sodium: 143 mmol/L (ref 135–145)
Total Bilirubin: 0.4 mg/dL (ref 0.3–1.2)
Total Protein: 7.8 g/dL (ref 6.5–8.1)

## 2019-09-14 MED ORDER — DIPHENHYDRAMINE HCL 50 MG/ML IJ SOLN
25.0000 mg | Freq: Once | INTRAMUSCULAR | Status: AC
  Administered 2019-09-14: 25 mg via INTRAVENOUS

## 2019-09-14 MED ORDER — LEUCOVORIN CALCIUM INJECTION 350 MG
400.0000 mg/m2 | Freq: Once | INTRAVENOUS | Status: AC
  Administered 2019-09-14: 788 mg via INTRAVENOUS
  Filled 2019-09-14: qty 39.4

## 2019-09-14 MED ORDER — FLUOROURACIL CHEMO INJECTION 2.5 GM/50ML
400.0000 mg/m2 | Freq: Once | INTRAVENOUS | Status: AC
  Administered 2019-09-14: 800 mg via INTRAVENOUS
  Filled 2019-09-14: qty 16

## 2019-09-14 MED ORDER — SODIUM CHLORIDE 0.9 % IV SOLN
2400.0000 mg/m2 | INTRAVENOUS | Status: DC
  Administered 2019-09-14: 4750 mg via INTRAVENOUS
  Filled 2019-09-14: qty 95

## 2019-09-14 MED ORDER — ACETAMINOPHEN 160 MG/5ML PO SOLN
ORAL | Status: AC
  Filled 2019-09-14: qty 20.3

## 2019-09-14 MED ORDER — ACETAMINOPHEN 160 MG/5ML PO SOLN
650.0000 mg | Freq: Once | ORAL | Status: AC
  Administered 2019-09-14: 650 mg via ORAL

## 2019-09-14 MED ORDER — SODIUM CHLORIDE 0.9 % IV SOLN
INTRAVENOUS | Status: DC
  Administered 2019-09-14: 10:00:00 via INTRAVENOUS
  Filled 2019-09-14: qty 250

## 2019-09-14 MED ORDER — SODIUM CHLORIDE 0.9% FLUSH
10.0000 mL | INTRAVENOUS | Status: DC | PRN
  Administered 2019-09-14: 09:00:00 10 mL
  Filled 2019-09-14: qty 10

## 2019-09-14 MED ORDER — DIPHENHYDRAMINE HCL 50 MG/ML IJ SOLN
INTRAMUSCULAR | Status: AC
  Filled 2019-09-14: qty 1

## 2019-09-14 MED ORDER — TRASTUZUMAB-DKST CHEMO 150 MG IV SOLR
450.0000 mg | Freq: Once | INTRAVENOUS | Status: AC
  Administered 2019-09-14: 450 mg via INTRAVENOUS
  Filled 2019-09-14: qty 21.43

## 2019-09-14 MED ORDER — PROMETHAZINE HCL 6.25 MG/5ML PO SYRP: 12.5000 mg | ORAL_SOLUTION | Freq: Four times a day (QID) | ORAL | 1 refills | Status: DC | PRN

## 2019-09-14 NOTE — Patient Instructions (Signed)
[facility] Discharge Instructions for Patients Receiving Chemotherapy  Today you received the following chemotherapy agents: trastuzumab, leucovorin, and 5FU.  To help prevent nausea and vomiting after your treatment, we encourage you to take your nausea medication as directed.   If you develop nausea and vomiting that is not controlled by your nausea medication, call the clinic.   BELOW ARE SYMPTOMS THAT SHOULD BE REPORTED IMMEDIATELY:  *FEVER GREATER THAN 100.5 F  *CHILLS WITH OR WITHOUT FEVER  NAUSEA AND VOMITING THAT IS NOT CONTROLLED WITH YOUR NAUSEA MEDICATION  *UNUSUAL SHORTNESS OF BREATH  *UNUSUAL BRUISING OR BLEEDING  TENDERNESS IN MOUTH AND THROAT WITH OR WITHOUT PRESENCE OF ULCERS  *URINARY PROBLEMS  *BOWEL PROBLEMS  UNUSUAL RASH Items with * indicate a potential emergency and should be followed up as soon as possible.  Feel free to call the clinic should you have any questions or concerns. The clinic phone number is [phone].  Please show the [facility] at check-in to the Emergency Department and triage nurse.

## 2019-09-14 NOTE — Telephone Encounter (Signed)
Informed nurse that he will need pump d/c and port de-access on 11/21 at 11:00

## 2019-09-14 NOTE — Progress Notes (Signed)
Per Dr. Sherrill: OK to treat w/creatinine 1.67 

## 2019-09-14 NOTE — Progress Notes (Signed)
[facility] OFFICE PROGRESS NOTE   Diagnosis: Gastroesophageal cancer  INTERVAL HISTORY:   Mr. Pelzer returns as scheduled.  He completed another cycle of 5-FU and Herceptin on 08/24/2019.  No mouth sores or hand/foot pain.  He reports progressive numbness in the extremities.  He has difficulty buttoning his shirt and holding objects.  Pins and needle sensation improved when he began gabapentin.  He continues tube feedings.  He is tolerating liquids, but not solids.  Objective:  Vital signs in last 24 hours:  Blood pressure (!) 156/71, pulse 86, temperature 98 F (36.7 C), temperature source Temporal, resp. rate 17, height '5\' 10"'$  (1.778 m), weight 179 lb 9.6 oz (81.5 kg), SpO2 100 %.    Physical examination secondary to distancing with the Covid pandemic GI: No hepatomegaly, nontender, no mass, left upper quadrant feeding tube site without evidence of infection Vascular: No leg edema    Lab Results:  Lab Results  Component Value Date   WBC 6.0 09/14/2019   HGB 10.0 (L) 09/14/2019   HCT 31.8 (L) 09/14/2019   MCV 96.1 09/14/2019   PLT 263 09/14/2019   NEUTROABS 4.2 09/14/2019    CMP  Lab Results  Component Value Date   NA 143 09/14/2019   K 4.0 09/14/2019   CL 103 09/14/2019   CO2 28 09/14/2019   GLUCOSE 157 (H) 09/14/2019   BUN 40 (H) 09/14/2019   CREATININE 1.67 (H) 09/14/2019   CALCIUM 9.6 09/14/2019   PROT 7.8 09/14/2019   ALBUMIN 3.5 09/14/2019   AST 36 09/14/2019   ALT 36 09/14/2019   ALKPHOS 155 (H) 09/14/2019   BILITOT 0.4 09/14/2019   GFRNONAA 41 (L) 09/14/2019   GFRAA 47 (L) 09/14/2019    Lab Results  Component Value Date   CEA1 4.62 08/24/2019    Medications: I have reviewed the patient's current medications.   Assessment/Plan: 1. 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 Partially obstructing esophagus mass 02/01/2017, biopsy confirmed moderately differentiated adenocarcinoma ? Staging CTs  03/06/2017-esophagus mass, single noncalcified pulmonary nodule the medial aspect of the left lower lobe, evidence of prior granulomatous disease, right adrenal adenoma ? PET scan 04/07/2017-hypermetabolic distal esophageal mass, no evidence of metastatic disease in the abdomen, pelvis, or skeleton. The right adrenal mass on CT was not hypermetabolic and favored to be an adenoma ? Radiation and concurrent weekly Taxol/carboplatin 04/20/2017-05/28/2017 ? CTs 07/05/2018-improvement in esophagus mass ? Endoscopy 07/26/2017-ulceration and friability from the GE junction up to 35 cm, 2 cm mass at the GE junction-improved in size, biopsy revealed no malignancy ? PET scan 08/19/2017-hypermetabolic activity at the distal esophagus and GE junction, no evidence of distant metastatic disease ? Admission to [facility] 11/18/2018 with a syncope event secondary to upper GI bleeding ? Upper endoscopy 11/18/2018-obstructing mass at 40 cm, biopsies revealed inflammation and no malignancy ? CTs 11/20/2018-10 mm nodule at the left lung apex, thickening at the distal esophagus, evidence of prior granulomatous disease with calcified right lung nodules and right paratracheal/hilar lymph nodes ? Upper endoscopy 12/07/2026-pinpoint stricture in the distal esophagus, lumen was 1 mm. Gastroesophageal mucosa with exudate consistent with ulcer. No malignancy.  ? PET scan 12/19/2018-focal area of increased uptake at or just below the level of the GE junction; moderate FDG uptake associated with left apical lung nodule. Bilateral pleural effusions and lower lobe atelectasis. ? Endoscopy via gastrostomy tube tract 01/05/2019-mass at the gastric cardia/GE junction, biopsy confirmed adenocarcinoma, CPS-5,Her-2 3+ ? Cycle 1 FOLFOX  01/18/2019 ? Cycle 2 FOLFOX 02/02/2019 ? Cycle 3 FOLFOX 02/16/2019 ? Cycle 4 FOLFOX 03/02/2019 ? Cycle 5 FOLFOX 03/16/2019 ? CT chest6/01/2019-new cavitary change in the 1 cm left upper lobe pulmonary  nodule, stable in size. Stable circumferential wall thickening of the lower thoracic esophagus/GE junction, no progressive disease ? Cycle 6 FOLFOX 03/30/2019, Herceptin added ? Cycle 7 FOLFOX/Herceptin 04/13/2019 ? Cycle 8 FOLFOX/Herceptin 05/09/2019 ? Cycle 9 FOLFOX/Herceptin 05/25/2019 (oxaliplatin held due to neutropenia) ? Cycle 10 FOLFOX/Herceptin 06/08/2019 ? CT chest 06/21/2019- no evidence of metastatic disease.  Small cyst in the apical left upper lobe appears slightly less thick-walled than on prior exam.  Distal esophageal wall thickening as before.  Trace left pleural effusion. ? Cycle 11 5-FU/Herceptin 06/21/2019 ? Cycle 12 5-FU/Herceptin 07/12/2019 ? Cycle 13 5-FU/Herceptin 08/03/2019 ? Cycle 14 5-FU/Herceptin 08/24/2019  ? Cycle 15 5-FU/Herceptin 09/14/2019 2. Upper GI bleeding secondary to #1, 11/18/2018-transfused with a total of 4 units of packed red blood cells 3. Nutrition-placement of gastrostomy feeding tube 11/22/2018  Feeding tube placed initial treatment in 2018, removed May 2019  4.Diabetes 5.Hypertension 6.Gout 7.Chronic renal failure 8. Admission 04/27/2019 with severe hyperglycemia, weight loss, and dehydration 9. 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 Rosebrook appears stable.  He is tolerating the 5-FU/Herceptin well.  The peripheral numbness is likely related to previous treatment with oxaliplatin.  Hopefully this will improve over the next several months.  He will complete another cycle of 5-FU/Herceptin today.  He will undergo a restaging chest CT when he returns in 3 weeks.  He would like to consider a repeat endoscopic evaluation with Dr. Ardis Hughs.  We will refer him to Dr. Ardis Hughs pending the CT result.  Betsy Coder, MD  09/14/2019  9:25 AM

## 2019-09-15 ENCOUNTER — Telehealth: Payer: Self-pay | Admitting: Oncology

## 2019-09-15 NOTE — Telephone Encounter (Signed)
Scheduled per los. Called and spoke with patient. Confirmed appts  

## 2019-09-16 ENCOUNTER — Inpatient Hospital Stay: Payer: Medicare Other

## 2019-09-17 ENCOUNTER — Encounter: Payer: Self-pay | Admitting: Oncology

## 2019-10-01 ENCOUNTER — Other Ambulatory Visit: Payer: Self-pay | Admitting: Oncology

## 2019-10-05 ENCOUNTER — Inpatient Hospital Stay: Payer: Medicare Other | Attending: Oncology | Admitting: Nurse Practitioner

## 2019-10-05 ENCOUNTER — Inpatient Hospital Stay: Payer: Medicare Other

## 2019-10-05 ENCOUNTER — Ambulatory Visit (HOSPITAL_COMMUNITY)
Admission: RE | Admit: 2019-10-05 | Discharge: 2019-10-05 | Disposition: A | Discharge status: Home / Self Care | Payer: Medicare Other | Source: Ambulatory Visit | Attending: Oncology | Admitting: Oncology

## 2019-10-05 ENCOUNTER — Other Ambulatory Visit: Payer: Self-pay

## 2019-10-05 ENCOUNTER — Encounter: Payer: Self-pay | Admitting: Nurse Practitioner

## 2019-10-05 ENCOUNTER — Inpatient Hospital Stay: Payer: Medicare Other | Admitting: Nutrition

## 2019-10-05 VITALS — BP 167/82 | HR 95 | Temp 98.1°F | Resp 17 | Ht 70.0 in | Wt 180.2 lb

## 2019-10-05 DIAGNOSIS — I10 Essential (primary) hypertension: Secondary | ICD-10-CM | POA: Diagnosis not present

## 2019-10-05 DIAGNOSIS — Z9221 Personal history of antineoplastic chemotherapy: Secondary | ICD-10-CM

## 2019-10-05 DIAGNOSIS — C155 Malignant neoplasm of lower third of esophagus: Secondary | ICD-10-CM | POA: Insufficient documentation

## 2019-10-05 DIAGNOSIS — Z5111 Encounter for antineoplastic chemotherapy: Secondary | ICD-10-CM | POA: Insufficient documentation

## 2019-10-05 DIAGNOSIS — Z5112 Encounter for antineoplastic immunotherapy: Secondary | ICD-10-CM | POA: Diagnosis not present

## 2019-10-05 DIAGNOSIS — Z95828 Presence of other vascular implants and grafts: Secondary | ICD-10-CM

## 2019-10-05 DIAGNOSIS — C159 Malignant neoplasm of esophagus, unspecified: Secondary | ICD-10-CM

## 2019-10-05 LAB — CMP (CANCER CENTER ONLY)
ALT: 43 U/L (ref 0–44)
AST: 38 U/L (ref 15–41)
Albumin: 3.2 g/dL — ABNORMAL LOW (ref 3.5–5.0)
Alkaline Phosphatase: 191 U/L — ABNORMAL HIGH (ref 38–126)
Anion gap: 10 (ref 5–15)
BUN: 40 mg/dL — ABNORMAL HIGH (ref 8–23)
CO2: 32 mmol/L (ref 22–32)
Calcium: 9.6 mg/dL (ref 8.9–10.3)
Chloride: 100 mmol/L (ref 98–111)
Creatinine: 1.74 mg/dL — ABNORMAL HIGH (ref 0.61–1.24)
GFR, Est AFR Am: 45 mL/min — ABNORMAL LOW (ref >60)
GFR, Estimated: 39 mL/min — ABNORMAL LOW (ref >60)
Glucose, Bld: 207 mg/dL — ABNORMAL HIGH (ref 70–99)
Potassium: 3.7 mmol/L (ref 3.5–5.1)
Sodium: 142 mmol/L (ref 135–145)
Total Bilirubin: 0.3 mg/dL (ref 0.3–1.2)
Total Protein: 7.9 g/dL (ref 6.5–8.1)

## 2019-10-05 LAB — CBC WITH DIFFERENTIAL (CANCER CENTER ONLY)
Abs Immature Granulocytes: 0.12 10*3/uL — ABNORMAL HIGH (ref 0.00–0.07)
Basophils Absolute: 0 10*3/uL (ref 0.0–0.1)
Basophils Relative: 0 %
Eosinophils Absolute: 0.2 10*3/uL (ref 0.0–0.5)
Eosinophils Relative: 2 %
HCT: 31.4 % — ABNORMAL LOW (ref 39.0–52.0)
Hemoglobin: 9.9 g/dL — ABNORMAL LOW (ref 13.0–17.0)
Immature Granulocytes: 1 %
Lymphocytes Relative: 8 %
Lymphs Abs: 0.7 10*3/uL (ref 0.7–4.0)
MCH: 29.8 pg (ref 26.0–34.0)
MCHC: 31.5 g/dL (ref 30.0–36.0)
MCV: 94.6 fL (ref 80.0–100.0)
Monocytes Absolute: 1.2 10*3/uL — ABNORMAL HIGH (ref 0.1–1.0)
Monocytes Relative: 13 %
Neutro Abs: 7.4 10*3/uL (ref 1.7–7.7)
Neutrophils Relative %: 76 %
Platelet Count: 313 10*3/uL (ref 150–400)
RBC: 3.32 MIL/uL — ABNORMAL LOW (ref 4.22–5.81)
RDW: 18.5 % — ABNORMAL HIGH (ref 11.5–15.5)
WBC Count: 9.7 10*3/uL (ref 4.0–10.5)
nRBC: 0 % (ref 0.0–0.2)

## 2019-10-05 LAB — CEA (IN HOUSE-CHCC): CEA (CHCC-In House): 4.41 ng/mL (ref 0.00–5.00)

## 2019-10-05 MED ORDER — FLUOROURACIL CHEMO INJECTION 2.5 GM/50ML
400.0000 mg/m2 | Freq: Once | INTRAVENOUS | Status: AC
  Administered 2019-10-05: 800 mg via INTRAVENOUS
  Filled 2019-10-05: qty 16

## 2019-10-05 MED ORDER — DIPHENHYDRAMINE HCL 50 MG/ML IJ SOLN
INTRAMUSCULAR | Status: AC
  Filled 2019-10-05: qty 1

## 2019-10-05 MED ORDER — DIPHENHYDRAMINE HCL 50 MG/ML IJ SOLN
25.0000 mg | Freq: Once | INTRAMUSCULAR | Status: AC
  Administered 2019-10-05: 25 mg via INTRAVENOUS

## 2019-10-05 MED ORDER — POTASSIUM CHLORIDE 20 MEQ PO PACK: PACK | ORAL | 1 refills | Status: DC

## 2019-10-05 MED ORDER — SODIUM CHLORIDE 0.9 % IV SOLN
INTRAVENOUS | Status: AC
  Administered 2019-10-05: 13:00:00 via INTRAVENOUS
  Filled 2019-10-05: qty 250

## 2019-10-05 MED ORDER — LEUCOVORIN CALCIUM INJECTION 350 MG
400.0000 mg/m2 | Freq: Once | INTRAVENOUS | Status: AC
  Administered 2019-10-05: 788 mg via INTRAVENOUS
  Filled 2019-10-05: qty 39.4

## 2019-10-05 MED ORDER — SODIUM CHLORIDE 0.9% FLUSH
10.0000 mL | INTRAVENOUS | Status: DC | PRN
  Administered 2019-10-05: 10:00:00 10 mL
  Filled 2019-10-05: qty 10

## 2019-10-05 MED ORDER — ESOMEPRAZOLE MAGNESIUM 40 MG PO CPDR: 40.0000 mg | DELAYED_RELEASE_CAPSULE | Freq: Every day | ORAL | 2 refills | Status: DC

## 2019-10-05 MED ORDER — ACETAMINOPHEN 160 MG/5ML PO SOLN
650.0000 mg | Freq: Once | ORAL | Status: AC
  Administered 2019-10-05: 650 mg via ORAL

## 2019-10-05 MED ORDER — SODIUM CHLORIDE 0.9 % IV SOLN
2400.0000 mg/m2 | INTRAVENOUS | Status: AC
  Administered 2019-10-05: 4750 mg via INTRAVENOUS
  Filled 2019-10-05: qty 95

## 2019-10-05 MED ORDER — TRASTUZUMAB-DKST CHEMO 150 MG IV SOLR
450.0000 mg | Freq: Once | INTRAVENOUS | Status: AC
  Administered 2019-10-05: 450 mg via INTRAVENOUS
  Filled 2019-10-05: qty 21.43

## 2019-10-05 MED ORDER — SODIUM CHLORIDE 0.9 % IV SOLN
Freq: Once | INTRAVENOUS | Status: AC
  Filled 2019-10-05: qty 250

## 2019-10-05 MED ORDER — ACETAMINOPHEN 160 MG/5ML PO SOLN
ORAL | Status: AC
  Filled 2019-10-05: qty 20.3

## 2019-10-05 NOTE — Progress Notes (Addendum)
[place] OFFICE PROGRESS NOTE   Diagnosis: Gastroesophageal cancer  INTERVAL HISTORY:   Lance Herring returns as scheduled.  He completed another cycle of 5-FU/Herceptin 09/14/2019.  He has occasional nausea/vomiting.  No mouth sores.  No diarrhea.  No shortness of breath or cough.  No fever.  No leg swelling or calf pain.  Tube feeding continues to be main source of nutrition.  Objective:  Vital signs in last 24 hours:  Blood pressure (!) 167/82, pulse 95, temperature 98.1 F (36.7 C), temperature source Oral, resp. rate 17, height _0  (1.778 m), weight 180 lb 3.2 oz (81.7 kg), SpO2 100 %.    HEENT: No thrush or ulcers. GI: Abdomen soft and nontender.  No hepatomegaly.  Left abdomen feeding tube site without erythema. Vascular: No leg edema. Neuro: Alert and oriented. Skin: Palms without erythema. Port-A-Cath without erythema.   Lab Results:  Lab Results  Component Value Date   WBC 9.7 10/05/2019   HGB 9.9 (L) 10/05/2019   HCT 31.4 (L) 10/05/2019   MCV 94.6 10/05/2019   PLT 313 10/05/2019   NEUTROABS 7.4 10/05/2019    Imaging:  CT Chest Wo Contrast  Result Date: 10/05/2019 CLINICAL DATA:  Restaging esophageal cancer. Initial diagnosis 2017. EXAM: CT CHEST WITHOUT CONTRAST TECHNIQUE: Multidetector CT imaging of the chest was performed following the standard protocol without IV contrast. COMPARISON:  06/21/2019 FINDINGS: Cardiovascular: The heart is upper limits of normal in size and stable. Small amount of pericardial fluid but no overt effusion. Stable tortuosity and calcification of the thoracic aorta. The right-sided Port-A-Cath is stable. Stable coronary artery calcifications. Mediastinum/Nodes: No obvious mediastinal or hilar adenopathy. There are scattered small lymph nodes. Numerous calcified right hilar and mediastinal nodes are again noted. Progressive and fairly marked distention of the mid and upper esophagus. Maximum diameter is 5 cm and the  esophagus is filled with fluid and small amount of air. Findings could be due to progressive stricture ring distally possibly due to radiation change. Could not exclude the possibility of recurrent tumor on this exam without IV contrast but I do not see any obvious distal esophageal mass. Lungs/Pleura: Stable emphysematous changes and pulmonary scarring. Stable irregular nodular density at the left lung apex measuring 8 mm. Progressive lower paramediastinal airspace consolidation and bronchiectasis most likely progressive radiation fibrosis in both lower lungs. New patchy areas of tree-in-bud nodularity and faint airspace opacities likely inflammatory process or atypical infection such as MAC. I do not see any worrisome pulmonary nodules to suggest pulmonary metastatic disease. There are stable calcified granulomas in the right lung. New small bilateral pleural effusions with overlying atelectasis. Upper Abdomen: Stable feeding gastrostomy tube in the antral region of the stomach. Bilateral adrenal gland nodules are stable and likely benign adenomas. Stable advanced vascular calcifications involving the aorta. No upper abdominal mass or adenopathy. No obvious hepatic lesions without contrast. Stable calcified granulomas. Musculoskeletal: No significant bony findings. IMPRESSION: 1. Progressive mid and upper esophageal distension. This could be due to progressive distal stricturing related to radiation. Recurrent tumor is also a possibility although I do not see an obvious mass without contrast. Further evaluation with contrast esophagram or endoscopy may be helpful for further evaluation. 2. No mediastinal or hilar adenopathy. Stable small calcified and noncalcified lymph nodes. 3. Stable 8 mm left apical pulmonary nodule. 4. 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 5. New inflammatory or atypical infectious process in the lung bases and new small  bilateral pleural  effusions. 6. Stable bilateral adrenal gland adenomas. Aortic Atherosclerosis (ICD10-I70.0) and Emphysema (ICD10-J43.9). Electronically Signed   By: Marijo Sanes M.D.   On: 10/05/2019 11:58    Medications: I have reviewed the patient's current medications.  Assessment/Plan: 1. 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 Partially obstructing esophagus mass 02/01/2017, biopsy confirmed moderately differentiated adenocarcinoma ? Staging CTs 03/06/2017-esophagus mass, single noncalcified pulmonary nodule the medial aspect of the left lower lobe, evidence of prior granulomatous disease, right adrenal adenoma ? PET scan 04/07/2017-hypermetabolic distal esophageal mass, no evidence of metastatic disease in the abdomen, pelvis, or skeleton. The right adrenal mass on CT was not hypermetabolic and favored to be an adenoma ? Radiation and concurrent weekly Taxol/carboplatin 04/20/2017-05/28/2017 ? CTs 07/05/2018-improvement in esophagus mass ? Endoscopy 07/26/2017-ulceration and friability from the GE junction up to 35 cm, 2 cm mass at the GE junction-improved in size, biopsy revealed no malignancy ? PET scan 08/19/2017-hypermetabolic activity at the distal esophagus and GE junction, no evidence of distant metastatic disease ? Admission to [facility] 11/18/2018 with a syncope event secondary to upper GI bleeding ? Upper endoscopy 11/18/2018-obstructing mass at 40 cm, biopsies revealed inflammation and no malignancy ? CTs 11/20/2018-10 mm nodule at the left lung apex, thickening at the distal esophagus, evidence of prior granulomatous disease with calcified right lung nodules and right paratracheal/hilar lymph nodes ? Upper endoscopy 12/07/2026-pinpoint stricture in the distal esophagus, lumen was 1 mm. Gastroesophageal mucosa with exudate consistent with ulcer. No malignancy.  ? PET scan 12/19/2018-focal area of increased uptake at or just below  the level of the GE junction; moderate FDG uptake associated with left apical lung nodule. Bilateral pleural effusions and lower lobe atelectasis. ? Endoscopy via gastrostomy tube tract 01/05/2019-mass at the gastric cardia/GE junction, biopsy confirmed adenocarcinoma, CPS-5,Her-2 3+ ? Cycle 1 FOLFOX 01/18/2019 ? Cycle 2 FOLFOX 02/02/2019 ? Cycle 3 FOLFOX 02/16/2019 ? Cycle 4 FOLFOX 03/02/2019 ? Cycle 5 FOLFOX 03/16/2019 ? CT chest6/01/2019-new cavitary change in the 1 cm left upper lobe pulmonary nodule, stable in size. Stable circumferential wall thickening of the lower thoracic esophagus/GE junction, no progressive disease ? Cycle 6 FOLFOX 03/30/2019, Herceptin added ? Cycle 7 FOLFOX/Herceptin 04/13/2019 ? Cycle 8 FOLFOX/Herceptin 05/09/2019 ? Cycle 9 FOLFOX/Herceptin 05/25/2019 (oxaliplatin held due to neutropenia) ? Cycle 10 FOLFOX/Herceptin 06/08/2019 ? CT chest 06/21/2019-no evidence of metastatic disease. Small cyst in the apical left upper lobe appears slightly less thick-walled than on prior exam. Distal esophageal wall thickening as before. Trace left pleural effusion. ? Cycle 11 5-FU/Herceptin 06/21/2019 ? Cycle125-FU/Herceptin 07/12/2019 ? Cycle135-FU/Herceptin 08/03/2019 ? Cycle 14 5-FU/Herceptin 08/24/2019  ? Cycle 15 5-FU/Herceptin 09/14/2019 ? CT chest 10/05/2019-progressive mid and upper esophageal distention.  No obvious mass.  Stable 8 mm left apical pulmonary nodule.  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  New inflammatory or atypical infectious process in the lung bases and new small bilateral pleural effusions. ? Cycle 16 5-FU/Herceptin 10/05/2019 2. Upper GI bleeding secondary to #1, 11/18/2018-transfused with a total of 4 units of packed red blood cells 3. Nutrition-placement of gastrostomy feeding tube 11/22/2018  Feeding tube placed initial treatment in 2018, removed May  2019  4.Diabetes 5.Hypertension 6.Gout 7.Chronic renal failure 8. Admission 04/27/2019 with severe hyperglycemia, weight loss, and dehydration 9. 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  He has completed 15 cycles of 5-FU/Herceptin.  Recent restaging CTs show no evidence of metastatic disease.  He is noted to have progressive distention of the esophagus.  No obvious mass is seen.  Dr. Benay Spice recommends continuation of 5-FU/Herceptin, cycle 16 today.  We are referring him back to Dr. Ardis Hughs for a restaging endoscopy.  The CT scan showed inflammation at the lung bases.  He is exhibiting no signs/symptoms of pneumonia.  He understands to contact the office with fever, chills, shortness of breath, cough.  We reviewed the labs from today, adequate to proceed.  Creatinine with stable elevation.  We are referring him for an echocardiogram for follow-up on Herceptin.  He will return for lab, follow-up, 5-FU/Herceptin in 4 weeks.  He will contact the office in the interim with any problems.  Patient seen with Dr. Benay Spice.  CT images reviewed on the computer with Lance Herring and his wife.  25 minutes were spent face-to-face at today's visit with the majority of that time involved in counseling/coordination of care.    Ned Card ANP/GNP-BC   10/05/2019  12:34 PM  This was a shared visit with Ned Card.  Lance Herring was interviewed and examined.  We reviewed the restaging CT images with him.  There is no clinical or CT evidence of disease progression.  A distal esophageal obstruction related to versus a radiation stricture.  We will asked Dr. Ardis Hughs repeat an endoscopic evaluation to evaluate the GE junction.  Lance Herring will continue 5-FU/Herceptin.  Julieanne Manson, MD

## 2019-10-05 NOTE — Patient Instructions (Signed)
[facility] Discharge Instructions for Patients Receiving Chemotherapy  Today you received the following chemotherapy agents:  Trastuzumab, Leucovorin, Fluorouracil  To help prevent nausea and vomiting after your treatment, we encourage you to take your nausea medication as prescribed.   If you develop nausea and vomiting that is not controlled by your nausea medication, call the clinic.   BELOW ARE SYMPTOMS THAT SHOULD BE REPORTED IMMEDIATELY:  *FEVER GREATER THAN 100.5 F  *CHILLS WITH OR WITHOUT FEVER  NAUSEA AND VOMITING THAT IS NOT CONTROLLED WITH YOUR NAUSEA MEDICATION  *UNUSUAL SHORTNESS OF BREATH  *UNUSUAL BRUISING OR BLEEDING  TENDERNESS IN MOUTH AND THROAT WITH OR WITHOUT PRESENCE OF ULCERS  *URINARY PROBLEMS  *BOWEL PROBLEMS  UNUSUAL RASH Items with * indicate a potential emergency and should be followed up as soon as possible.  Feel free to call the clinic should you have any questions or concerns. The clinic phone number is [phone].  Please show the [facility] at check-in to the Emergency Department and triage nurse.

## 2019-10-05 NOTE — Progress Notes (Signed)
Brief wellness check with patient who remains on continuous tube feeding. Patient has a gastric mass/esophageal cancer for which he is receiving treatment. He continues to tolerate 6 bottles Glucerna 1.5 daily Weight is improved and was documented as 179.6 pounds. Reports tolerating some liquids but no solids. Denies nausea, vomiting, constipation, or diarrhea. Encourage patient to contact me with questions or concerns.  He should continue present nutrition therapy.

## 2019-10-05 NOTE — Progress Notes (Unsigned)
Per Ned Card, NP / Dr Benay Spice OK to proceed with today's treatment with  CRT 1.74

## 2019-10-05 NOTE — Patient Instructions (Signed)

## 2019-10-06 ENCOUNTER — Telehealth: Payer: Self-pay

## 2019-10-06 ENCOUNTER — Encounter: Payer: Self-pay | Admitting: Physician Assistant

## 2019-10-06 ENCOUNTER — Ambulatory Visit (INDEPENDENT_AMBULATORY_CARE_PROVIDER_SITE_OTHER): Payer: Medicare Other | Admitting: Physician Assistant

## 2019-10-06 VITALS — BP 140/70 | HR 88 | Temp 98.3°F | Ht 70.0 in | Wt 179.8 lb

## 2019-10-06 DIAGNOSIS — R1310 Dysphagia, unspecified: Secondary | ICD-10-CM | POA: Diagnosis not present

## 2019-10-06 DIAGNOSIS — C155 Malignant neoplasm of lower third of esophagus: Secondary | ICD-10-CM

## 2019-10-06 DIAGNOSIS — R1319 Other dysphagia: Secondary | ICD-10-CM

## 2019-10-06 DIAGNOSIS — Z8501 Personal history of malignant neoplasm of esophagus: Secondary | ICD-10-CM

## 2019-10-06 DIAGNOSIS — Z931 Gastrostomy status: Secondary | ICD-10-CM

## 2019-10-06 NOTE — Telephone Encounter (Signed)
-----   Message from Milus Banister, MD sent at 10/06/2019  9:22 AM EST ----- Happy to get him back in.  I have time xmas eve still. If he does not want that then it'll be into January.  I will plan to use Gtube site for endoscope (a thin pediatric scope worked in the past). I am not sure that I will be able to dilate the stricture that way but could try orally with adult scope at the same setting.    Elea Holtzclaw,  Can you please offer him EGD (1 hour case, using fluoroscopy likely), will need replacement Gtubes available (make sure endo knows that) Thursday dec 24th.  If he does not want to because it's xmas eve then offer my first available spot in January.  Thanks. Dx; GE junction cancer.  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  Can you schedule him for a repeat evaluation via the feeding tube site?  Thanks,  [employer]

## 2019-10-06 NOTE — Patient Instructions (Signed)
If you are age 71 or older, your body mass index should be between 23-30. Your Body mass index is 25.8 kg/m. If this is out of the aforementioned range listed, please consider follow up with your Primary Care Provider.  If you are age 73 or younger, your body mass index should be between 19-25. Your Body mass index is 25.8 kg/m. If this is out of the aformentioned range listed, please consider follow up with your Primary Care Provider.   You have been scheduled for an endoscopy. Please follow written instructions given to you at your visit today. If you use inhalers (even only as needed), please bring them with you on the day of your procedure. Your physician has requested that you go to www.startemmi.com and enter the access code given to you at your visit today. This web site gives a general overview about your procedure. However, you should still follow specific instructions given to you by our office regarding your preparation for the procedure.  Due to recent COVID-19 restrictions implemented by our local and state authorities and in an effort to keep both patients and staff as safe as possible, our hospital system now requires COVID-19 testing prior to any scheduled hospital procedure. Please go to our [facility] location drive thru testing site ([address] [place], [place] [address]) on 10/16/19 at  9 am. There will be multiple testing areas, the first checkpoint being for pre-procedure/surgery testing. Get into the right (yellow) lane that leads to the PAT testing team. You will not be billed at the time of testing but may receive a bill later depending on your insurance. The approximate cost of the test is $100. You must agree to quarantine from the time of your testing until the procedure date on 10/19/19 . This should include staying at home with ONLY the people you live with. Avoid take-out, grocery store shopping or leaving the house for any non-emergent reason. Failure to have  your COVID-19 test done on the date and time you have been scheduled will result in cancellation of procedure. Please call our office at [phone] if you have any questions.   Thank you for choosing me and [facility].    Ellouise Newer, PA-C

## 2019-10-06 NOTE — Progress Notes (Signed)
Chief Complaint: Follow-up esophageal cancer  HPI:    Lance Herring is a 73 year old male known to Dr. Ardis Hughs, who presents to clinic today for follow-up of esophageal cancer.    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  He underwent radiation and chemo but unfortunately had progression.  EGD in January 2020 showed an obstructing mass at 40 cm.  Biopsies were done and showed inflammation but no malignancy.  EGD repeated in February 2020 with a pinpoint stricture in the distal esophagus with a lumen measuring 1 mm, the mucosa appeared to be consistent with exudative ulcer.  PET scan in February 2020 showed bilateral pleural effusions and increased uptake just below the GE junction.  EGD via gastrostomy tract with Dr. Ardis Hughs on 01/05/2019 that confirmed a mass at the gastric cardia/GE junction and biopsies confirmed adenocarcinoma.  Resume chemotherapy with FOLFOX.    10/06/2019 Dr. Benay Spice contacted Dr. Ardis Hughs in regards to restaging patients GE junction carcinoma.  Apparently this is only accessible by endoscopic evaluation via the feeding tube.  Dr. Ardis Hughs is already okayed a procedure on Christmas Eve.    Today, the patient tells me that he has been having some dysphagia.  Over the past couple of months this is even to water and makes him nervous to eat, so he has been using his G-tube solely for eating purposes.  Denies any weight loss.  Denies abdominal pain.    Denies fever, chills or change in bowel habits.  Past Medical History:  Diagnosis Date  . Cataract   . CKD (chronic kidney disease) stage 3, GFR 30-59 ml/min   . DM2 (diabetes mellitus, type 2) ([place])   . Esophageal cancer ([place]) dx'd 2017  . Glaucoma   . Gout   . HTN (hypertension)     Past Surgical History:  Procedure Laterality Date  . BIOPSY  01/05/2019   Procedure: BIOPSY;  Surgeon: Milus Banister, MD;  Location: [facility];  Service: Endoscopy;;  . ESOPHAGOGASTRODUODENOSCOPY (EGD) WITH  PROPOFOL N/A 01/05/2019   Procedure: ESOPHAGOGASTRODUODENOSCOPY (EGD) WITH PROPOFOL;  Surgeon: Milus Banister, MD;  Location: [facility];  Service: Endoscopy;  Laterality: N/A;  . IR GASTROSTOMY TUBE MOD SED  11/22/2018  . IR REPLC GASTRO/COLONIC TUBE PERCUT W/FLUORO  07/12/2019  . ORCHIECTOMY Right    Benign    Current Outpatient Medications  Medication Sig Dispense Refill  . allopurinol (ZYLOPRIM) 100 MG tablet Take 100 mg by mouth daily.    Marland Kitchen amLODipine (NORVASC) 5 MG tablet Take 5 mg by mouth daily.    Marland Kitchen esomeprazole (NEXIUM) 40 MG capsule Take 1 capsule (40 mg total) by mouth daily. 30 capsule 2  . gabapentin (NEURONTIN) 100 MG capsule Take 1 capsule (100 mg total) by mouth 2 (two) times daily. 60 capsule 1  . gabapentin (NEURONTIN) 300 MG capsule Take 1 capsule (300 mg total) by mouth at bedtime. 30 capsule 5  . glimepiride (AMARYL) 4 MG tablet Take 4 mg by mouth daily.    Marland Kitchen lidocaine-prilocaine (EMLA) cream Apply 1 application topically as needed. (Patient taking differently: Apply 1 application topically as needed (port). ) 30 g 0  . Nutritional Supplements (FEEDING SUPPLEMENT, GLUCERNA 1.5 CAL,) LIQD Give 2 cartons of formula at 7am and 11am,and 1 carton at 3pm and 7pm.   Flush with 177ml water before and after each feeding. 1422 mL 5  . pioglitazone (ACTOS) 45 MG tablet Take 45 mg by mouth daily.    . potassium chloride (  KLOR-CON) 20 MEQ packet PLACE 20 MEQ INTO FEEDING TUBE DAILY AS DIRECTED 90 packet 1  . promethazine (PHENERGAN) 6.25 MG/5ML syrup Take 10 mLs (12.5 mg total) by mouth every 6 (six) hours as needed for nausea or vomiting. 120 mL 1  . Travoprost, BAK Free, (TRAVATAN) 0.004 % SOLN ophthalmic solution Place 1 drop into both eyes every evening.     No current facility-administered medications for this visit.   Facility-Administered Medications Ordered in Other Visits  Medication Dose Route Frequency Provider Last Rate Last Admin  . 0.9 %  sodium chloride infusion    Intravenous Continuous Ladell Pier, MD   Stopped at 10/05/19 1516  . 0.9 %  sodium chloride infusion   Intravenous Once Ladell Pier, MD      . fluorouracil (ADRUCIL) 4,750 mg in sodium chloride 0.9 % 55 mL chemo infusion  2,400 mg/m2 (Treatment Plan Recorded) Intravenous 1 day or 1 dose Ladell Pier, MD   4,750 mg at 10/05/19 1523    Allergies as of 10/06/2019  . (No Known Allergies)    Family History  Problem Relation Age of Onset  . Stomach cancer Neg Hx   . Esophageal cancer Neg Hx   . Pancreatic cancer Neg Hx   . Colon cancer Neg Hx   . Liver disease Neg Hx     Social History   Socioeconomic History  . Marital status: Married    Spouse name: Altha Harm  . Number of children: Not on file  . Years of education: 3  . Highest education level: Not on file  Occupational History  . Occupation: Retired Publishing copy, [employer]  Tobacco Use  . Smoking status: Former Smoker    Types: Cigarettes    Quit date: 1980    Years since quitting: 40.9  . Smokeless tobacco: Never Used  Substance and Sexual Activity  . Alcohol use: Never  . Drug use: Never  . Sexual activity: Not Currently    Partners: Female  Other Topics Concern  . Not on file  Social History Narrative  . Not on file   Social Determinants of Health   Financial Resource Strain: Low Risk   . Difficulty of Paying Living Expenses: Not very hard  Food Insecurity: No Food Insecurity  . Worried About Charity fundraiser in the Last Year: Never true  . Ran Out of Food in the Last Year: Never true  Transportation Needs: No Transportation Needs  . Lack of Transportation (Medical): No  . Lack of Transportation (Non-Medical): No  Physical Activity: Inactive  . Days of Exercise per Week: 0 days  . Minutes of Exercise per Session: 0 min  Stress: No Stress Concern Present  . Feeling of Stress : Not at all  Social Connections: Slightly Isolated  . Frequency of Communication with Friends  and Family: Three times a week  . Frequency of Social Gatherings with Friends and Family: Three times a week  . Attends Religious Services: More than 4 times per year  . Active Member of Clubs or Organizations: No  . Attends Archivist Meetings: Never  . Marital Status: Married  Human resources officer Violence: Not At Risk  . Fear of Current or Ex-Partner: No  . Emotionally Abused: No  . Physically Abused: No  . Sexually Abused: No    Review of Systems:    Constitutional: No weight loss, fever or chills Cardiovascular: No chest pain Respiratory: No SOB  Gastrointestinal: See HPI  and otherwise negative   Physical Exam:  Vital signs: BP 140/70   Pulse 88   Temp 98.3 F (36.8 C)   Ht 5\' 10"  (1.778 m)   Wt 179 lb 12.8 oz (81.6 kg)   BMI 25.80 kg/m   Constitutional:   Pleasant AA male appears to be in NAD, Well developed, Well nourished, alert and cooperative Respiratory: Respirations even and unlabored. Lungs clear to auscultation bilaterally.   No wheezes, crackles, or rhonchi.  Cardiovascular: Normal S1, S2. No MRG. Regular rate and rhythm. No peripheral edema, cyanosis or pallor.  Gastrointestinal:  Soft, nondistended, nontender. No rebound or guarding. Normal bowel sounds. No appreciable masses or hepatomegaly.+g tube Rectal:  Not performed.  Psychiatric: Demonstrates good judgement and reason without abnormal affect or behaviors.  RELEVANT LABS AND IMAGING: CBC    Component Value Date/Time   WBC 9.7 10/05/2019 0938   WBC 4.5 04/29/2019 0505   RBC 3.32 (L) 10/05/2019 0938   HGB 9.9 (L) 10/05/2019 0938   HCT 31.4 (L) 10/05/2019 0938   PLT 313 10/05/2019 0938   MCV 94.6 10/05/2019 0938   MCH 29.8 10/05/2019 0938   MCHC 31.5 10/05/2019 0938   RDW 18.5 (H) 10/05/2019 0938   LYMPHSABS 0.7 10/05/2019 0938   MONOABS 1.2 (H) 10/05/2019 0938   EOSABS 0.2 10/05/2019 0938   BASOSABS 0.0 10/05/2019 0938    CMP     Component Value Date/Time   NA 142 10/05/2019 0938     K 3.7 10/05/2019 0938   CL 100 10/05/2019 0938   CO2 32 10/05/2019 0938   GLUCOSE 207 (H) 10/05/2019 0938   BUN 40 (H) 10/05/2019 0938   CREATININE 1.74 (H) 10/05/2019 0938   CALCIUM 9.6 10/05/2019 0938   PROT 7.9 10/05/2019 0938   ALBUMIN 3.2 (L) 10/05/2019 0938   AST 38 10/05/2019 0938   ALT 43 10/05/2019 0938   ALKPHOS 191 (H) 10/05/2019 0938   BILITOT 0.3 10/05/2019 0938   GFRNONAA 39 (L) 10/05/2019 0938   GFRAA 45 (L) 10/05/2019 0938    Assessment: 1.  History of esophageal adenocarcinoma: Dr. Malachy Mood requesting restaging 2.  Dysphagia: To even liquids over the past couple of months  Plan: 1.  Dr. Ardis Hughs had previously approved for an EGD through patient's gastrostomy tract at the hospital on Christmas Eve.  Went ahead and scheduled patient for this.  Did review risks, benefits, limitations and alternatives and patient agrees to proceed. 2.  Patient to follow in clinic per recommendations from Dr. Ardis Hughs after time of procedure.  Ellouise Newer, PA-C [facility] 10/06/2019, 2:40 PM  Cc: [facility]

## 2019-10-06 NOTE — H&P (View-Only) (Signed)
Chief Complaint: Follow-up esophageal cancer  HPI:    Mr. Huibregtse is a 73 year old male known to Dr. Ardis Hughs, who presents to clinic today for follow-up of esophageal cancer.    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  He underwent radiation and chemo but unfortunately had progression.  EGD in January 2020 showed an obstructing mass at 40 cm.  Biopsies were done and showed inflammation but no malignancy.  EGD repeated in February 2020 with a pinpoint stricture in the distal esophagus with a lumen measuring 1 mm, the mucosa appeared to be consistent with exudative ulcer.  PET scan in February 2020 showed bilateral pleural effusions and increased uptake just below the GE junction.  EGD via gastrostomy tract with Dr. Ardis Hughs on 01/05/2019 that confirmed a mass at the gastric cardia/GE junction and biopsies confirmed adenocarcinoma.  Resume chemotherapy with FOLFOX.    10/06/2019 Dr. Benay Spice contacted Dr. Ardis Hughs in regards to restaging patients GE junction carcinoma.  Apparently this is only accessible by endoscopic evaluation via the feeding tube.  Dr. Ardis Hughs is already okayed a procedure on Christmas Eve.    Today, the patient tells me that he has been having some dysphagia.  Over the past couple of months this is even to water and makes him nervous to eat, so he has been using his G-tube solely for eating purposes.  Denies any weight loss.  Denies abdominal pain.    Denies fever, chills or change in bowel habits.  Past Medical History:  Diagnosis Date  . Cataract   . CKD (chronic kidney disease) stage 3, GFR 30-59 ml/min   . DM2 (diabetes mellitus, type 2) ([place])   . Esophageal cancer ([place]) dx'd 2017  . Glaucoma   . Gout   . HTN (hypertension)     Past Surgical History:  Procedure Laterality Date  . BIOPSY  01/05/2019   Procedure: BIOPSY;  Surgeon: Milus Banister, MD;  Location: [facility];  Service: Endoscopy;;  . ESOPHAGOGASTRODUODENOSCOPY (EGD) WITH  PROPOFOL N/A 01/05/2019   Procedure: ESOPHAGOGASTRODUODENOSCOPY (EGD) WITH PROPOFOL;  Surgeon: Milus Banister, MD;  Location: [facility];  Service: Endoscopy;  Laterality: N/A;  . IR GASTROSTOMY TUBE MOD SED  11/22/2018  . IR REPLC GASTRO/COLONIC TUBE PERCUT W/FLUORO  07/12/2019  . ORCHIECTOMY Right    Benign    Current Outpatient Medications  Medication Sig Dispense Refill  . allopurinol (ZYLOPRIM) 100 MG tablet Take 100 mg by mouth daily.    Marland Kitchen amLODipine (NORVASC) 5 MG tablet Take 5 mg by mouth daily.    Marland Kitchen esomeprazole (NEXIUM) 40 MG capsule Take 1 capsule (40 mg total) by mouth daily. 30 capsule 2  . gabapentin (NEURONTIN) 100 MG capsule Take 1 capsule (100 mg total) by mouth 2 (two) times daily. 60 capsule 1  . gabapentin (NEURONTIN) 300 MG capsule Take 1 capsule (300 mg total) by mouth at bedtime. 30 capsule 5  . glimepiride (AMARYL) 4 MG tablet Take 4 mg by mouth daily.    Marland Kitchen lidocaine-prilocaine (EMLA) cream Apply 1 application topically as needed. (Patient taking differently: Apply 1 application topically as needed (port). ) 30 g 0  . Nutritional Supplements (FEEDING SUPPLEMENT, GLUCERNA 1.5 CAL,) LIQD Give 2 cartons of formula at 7am and 11am,and 1 carton at 3pm and 7pm.   Flush with 141ml water before and after each feeding. 1422 mL 5  . pioglitazone (ACTOS) 45 MG tablet Take 45 mg by mouth daily.    . potassium chloride (  KLOR-CON) 20 MEQ packet PLACE 20 MEQ INTO FEEDING TUBE DAILY AS DIRECTED 90 packet 1  . promethazine (PHENERGAN) 6.25 MG/5ML syrup Take 10 mLs (12.5 mg total) by mouth every 6 (six) hours as needed for nausea or vomiting. 120 mL 1  . Travoprost, BAK Free, (TRAVATAN) 0.004 % SOLN ophthalmic solution Place 1 drop into both eyes every evening.     No current facility-administered medications for this visit.   Facility-Administered Medications Ordered in Other Visits  Medication Dose Route Frequency Provider Last Rate Last Admin  . 0.9 %  sodium chloride infusion    Intravenous Continuous Ladell Pier, MD   Stopped at 10/05/19 1516  . 0.9 %  sodium chloride infusion   Intravenous Once Ladell Pier, MD      . fluorouracil (ADRUCIL) 4,750 mg in sodium chloride 0.9 % 55 mL chemo infusion  2,400 mg/m2 (Treatment Plan Recorded) Intravenous 1 day or 1 dose Ladell Pier, MD   4,750 mg at 10/05/19 1523    Allergies as of 10/06/2019  . (No Known Allergies)    Family History  Problem Relation Age of Onset  . Stomach cancer Neg Hx   . Esophageal cancer Neg Hx   . Pancreatic cancer Neg Hx   . Colon cancer Neg Hx   . Liver disease Neg Hx     Social History   Socioeconomic History  . Marital status: Married    Spouse name: Altha Harm  . Number of children: Not on file  . Years of education: 99  . Highest education level: Not on file  Occupational History  . Occupation: Retired Publishing copy, [employer]  Tobacco Use  . Smoking status: Former Smoker    Types: Cigarettes    Quit date: 1980    Years since quitting: 40.9  . Smokeless tobacco: Never Used  Substance and Sexual Activity  . Alcohol use: Never  . Drug use: Never  . Sexual activity: Not Currently    Partners: Female  Other Topics Concern  . Not on file  Social History Narrative  . Not on file   Social Determinants of Health   Financial Resource Strain: Low Risk   . Difficulty of Paying Living Expenses: Not very hard  Food Insecurity: No Food Insecurity  . Worried About Charity fundraiser in the Last Year: Never true  . Ran Out of Food in the Last Year: Never true  Transportation Needs: No Transportation Needs  . Lack of Transportation (Medical): No  . Lack of Transportation (Non-Medical): No  Physical Activity: Inactive  . Days of Exercise per Week: 0 days  . Minutes of Exercise per Session: 0 min  Stress: No Stress Concern Present  . Feeling of Stress : Not at all  Social Connections: Slightly Isolated  . Frequency of Communication with Friends  and Family: Three times a week  . Frequency of Social Gatherings with Friends and Family: Three times a week  . Attends Religious Services: More than 4 times per year  . Active Member of Clubs or Organizations: No  . Attends Archivist Meetings: Never  . Marital Status: Married  Human resources officer Violence: Not At Risk  . Fear of Current or Ex-Partner: No  . Emotionally Abused: No  . Physically Abused: No  . Sexually Abused: No    Review of Systems:    Constitutional: No weight loss, fever or chills Cardiovascular: No chest pain Respiratory: No SOB  Gastrointestinal: See HPI  and otherwise negative   Physical Exam:  Vital signs: BP 140/70   Pulse 88   Temp 98.3 F (36.8 C)   Ht 5\' 10"  (1.778 m)   Wt 179 lb 12.8 oz (81.6 kg)   BMI 25.80 kg/m   Constitutional:   Pleasant AA male appears to be in NAD, Well developed, Well nourished, alert and cooperative Respiratory: Respirations even and unlabored. Lungs clear to auscultation bilaterally.   No wheezes, crackles, or rhonchi.  Cardiovascular: Normal S1, S2. No MRG. Regular rate and rhythm. No peripheral edema, cyanosis or pallor.  Gastrointestinal:  Soft, nondistended, nontender. No rebound or guarding. Normal bowel sounds. No appreciable masses or hepatomegaly.+g tube Rectal:  Not performed.  Psychiatric: Demonstrates good judgement and reason without abnormal affect or behaviors.  RELEVANT LABS AND IMAGING: CBC    Component Value Date/Time   WBC 9.7 10/05/2019 0938   WBC 4.5 04/29/2019 0505   RBC 3.32 (L) 10/05/2019 0938   HGB 9.9 (L) 10/05/2019 0938   HCT 31.4 (L) 10/05/2019 0938   PLT 313 10/05/2019 0938   MCV 94.6 10/05/2019 0938   MCH 29.8 10/05/2019 0938   MCHC 31.5 10/05/2019 0938   RDW 18.5 (H) 10/05/2019 0938   LYMPHSABS 0.7 10/05/2019 0938   MONOABS 1.2 (H) 10/05/2019 0938   EOSABS 0.2 10/05/2019 0938   BASOSABS 0.0 10/05/2019 0938    CMP     Component Value Date/Time   NA 142 10/05/2019 0938     K 3.7 10/05/2019 0938   CL 100 10/05/2019 0938   CO2 32 10/05/2019 0938   GLUCOSE 207 (H) 10/05/2019 0938   BUN 40 (H) 10/05/2019 0938   CREATININE 1.74 (H) 10/05/2019 0938   CALCIUM 9.6 10/05/2019 0938   PROT 7.9 10/05/2019 0938   ALBUMIN 3.2 (L) 10/05/2019 0938   AST 38 10/05/2019 0938   ALT 43 10/05/2019 0938   ALKPHOS 191 (H) 10/05/2019 0938   BILITOT 0.3 10/05/2019 0938   GFRNONAA 39 (L) 10/05/2019 0938   GFRAA 45 (L) 10/05/2019 0938    Assessment: 1.  History of esophageal adenocarcinoma: Dr. Malachy Mood requesting restaging 2.  Dysphagia: To even liquids over the past couple of months  Plan: 1.  Dr. Ardis Hughs had previously approved for an EGD through patient's gastrostomy tract at the hospital on Christmas Eve.  Went ahead and scheduled patient for this.  Did review risks, benefits, limitations and alternatives and patient agrees to proceed. 2.  Patient to follow in clinic per recommendations from Dr. Ardis Hughs after time of procedure.  Ellouise Newer, PA-C [facility] 10/06/2019, 2:40 PM  Cc: [facility]

## 2019-10-06 NOTE — Progress Notes (Signed)
I agree with the above note, plan 

## 2019-10-06 NOTE — Telephone Encounter (Signed)
Pt had an appt with Ellouise Newer PA pt has been scheduled.

## 2019-10-06 NOTE — Telephone Encounter (Signed)
Called pt no answer and no voicemail.

## 2019-10-16 ENCOUNTER — Other Ambulatory Visit (HOSPITAL_COMMUNITY)
Admission: RE | Admit: 2019-10-16 | Discharge: 2019-10-16 | Disposition: A | Discharge status: Home / Self Care | Payer: Medicare Other | Source: Ambulatory Visit | Attending: Gastroenterology | Admitting: Gastroenterology

## 2019-10-16 DIAGNOSIS — Z01812 Encounter for preprocedural laboratory examination: Secondary | ICD-10-CM | POA: Insufficient documentation

## 2019-10-16 DIAGNOSIS — Z20828 Contact with and (suspected) exposure to other viral communicable diseases: Secondary | ICD-10-CM | POA: Insufficient documentation

## 2019-10-17 ENCOUNTER — Encounter: Payer: Self-pay | Admitting: Nurse Practitioner

## 2019-10-17 ENCOUNTER — Encounter: Payer: Self-pay | Admitting: Oncology

## 2019-10-17 LAB — NOVEL CORONAVIRUS, NAA (HOSP ORDER, SEND-OUT TO REF LAB; TAT 18-24 HRS): SARS-CoV-2, NAA: NOT DETECTED

## 2019-10-19 ENCOUNTER — Ambulatory Visit (HOSPITAL_COMMUNITY): Payer: Medicare Other

## 2019-10-19 ENCOUNTER — Ambulatory Visit (HOSPITAL_COMMUNITY): Payer: Medicare Other | Admitting: Certified Registered"

## 2019-10-19 ENCOUNTER — Other Ambulatory Visit: Payer: Self-pay

## 2019-10-19 ENCOUNTER — Encounter (HOSPITAL_COMMUNITY)
Admission: RE | Disposition: A | Discharge status: Home / Self Care | Payer: Self-pay | Source: Home / Self Care | Attending: Gastroenterology

## 2019-10-19 ENCOUNTER — Ambulatory Visit (HOSPITAL_COMMUNITY)
Admission: RE | Admit: 2019-10-19 | Discharge: 2019-10-19 | Disposition: A | Discharge status: Home / Self Care | Payer: Medicare Other | Attending: Gastroenterology | Admitting: Gastroenterology

## 2019-10-19 ENCOUNTER — Encounter (HOSPITAL_COMMUNITY): Payer: Self-pay | Admitting: Gastroenterology

## 2019-10-19 DIAGNOSIS — C160 Malignant neoplasm of cardia: Secondary | ICD-10-CM | POA: Diagnosis not present

## 2019-10-19 DIAGNOSIS — Z9221 Personal history of antineoplastic chemotherapy: Secondary | ICD-10-CM | POA: Insufficient documentation

## 2019-10-19 DIAGNOSIS — I129 Hypertensive chronic kidney disease with stage 1 through stage 4 chronic kidney disease, or unspecified chronic kidney disease: Secondary | ICD-10-CM | POA: Insufficient documentation

## 2019-10-19 DIAGNOSIS — Z923 Personal history of irradiation: Secondary | ICD-10-CM | POA: Diagnosis not present

## 2019-10-19 DIAGNOSIS — E1122 Type 2 diabetes mellitus with diabetic chronic kidney disease: Secondary | ICD-10-CM | POA: Insufficient documentation

## 2019-10-19 DIAGNOSIS — R1310 Dysphagia, unspecified: Secondary | ICD-10-CM

## 2019-10-19 DIAGNOSIS — Z79899 Other long term (current) drug therapy: Secondary | ICD-10-CM | POA: Insufficient documentation

## 2019-10-19 DIAGNOSIS — K222 Esophageal obstruction: Secondary | ICD-10-CM | POA: Insufficient documentation

## 2019-10-19 DIAGNOSIS — M109 Gout, unspecified: Secondary | ICD-10-CM | POA: Diagnosis not present

## 2019-10-19 DIAGNOSIS — Z7984 Long term (current) use of oral hypoglycemic drugs: Secondary | ICD-10-CM | POA: Insufficient documentation

## 2019-10-19 DIAGNOSIS — C159 Malignant neoplasm of esophagus, unspecified: Secondary | ICD-10-CM

## 2019-10-19 DIAGNOSIS — R1319 Other dysphagia: Secondary | ICD-10-CM

## 2019-10-19 DIAGNOSIS — N183 Chronic kidney disease, stage 3 unspecified: Secondary | ICD-10-CM | POA: Insufficient documentation

## 2019-10-19 DIAGNOSIS — H409 Unspecified glaucoma: Secondary | ICD-10-CM | POA: Insufficient documentation

## 2019-10-19 DIAGNOSIS — K922 Gastrointestinal hemorrhage, unspecified: Secondary | ICD-10-CM | POA: Diagnosis not present

## 2019-10-19 DIAGNOSIS — Z8501 Personal history of malignant neoplasm of esophagus: Secondary | ICD-10-CM | POA: Diagnosis not present

## 2019-10-19 DIAGNOSIS — Z931 Gastrostomy status: Secondary | ICD-10-CM

## 2019-10-19 DIAGNOSIS — Z87891 Personal history of nicotine dependence: Secondary | ICD-10-CM | POA: Diagnosis not present

## 2019-10-19 DIAGNOSIS — C155 Malignant neoplasm of lower third of esophagus: Secondary | ICD-10-CM

## 2019-10-19 HISTORY — PX: ESOPHAGOGASTRODUODENOSCOPY (EGD) WITH PROPOFOL: SHX5813

## 2019-10-19 HISTORY — PX: BIOPSY: SHX5522

## 2019-10-19 HISTORY — PX: BALLOON DILATION: SHX5330

## 2019-10-19 LAB — GLUCOSE, CAPILLARY: Glucose-Capillary: 125 mg/dL — ABNORMAL HIGH (ref 70–99)

## 2019-10-19 SURGERY — ESOPHAGOGASTRODUODENOSCOPY (EGD) WITH PROPOFOL
Anesthesia: Monitor Anesthesia Care

## 2019-10-19 MED ORDER — SODIUM CHLORIDE 0.9 % IV SOLN
INTRAVENOUS | Status: DC
  Administered 2019-10-19: 500 mL via INTRAVENOUS

## 2019-10-19 MED ORDER — GLYCOPYRROLATE 0.2 MG/ML IJ SOLN
INTRAMUSCULAR | Status: DC | PRN
  Administered 2019-10-19: .2 mg via INTRAVENOUS

## 2019-10-19 MED ORDER — PROPOFOL 10 MG/ML IV BOLUS
INTRAVENOUS | Status: DC | PRN
  Administered 2019-10-19: 50 mg via INTRAVENOUS
  Administered 2019-10-19: 25 ug/kg/min via INTRAVENOUS

## 2019-10-19 MED ORDER — HEPARIN SOD (PORK) LOCK FLUSH 100 UNIT/ML IV SOLN
500.0000 [IU] | INTRAVENOUS | Status: AC | PRN
  Administered 2019-10-19: 500 [IU]

## 2019-10-19 SURGICAL SUPPLY — 14 items

## 2019-10-19 NOTE — Interval H&P Note (Signed)
History and Physical Interval Note:  10/19/2019 7:27 AM  Lance Herring  has presented today for surgery, with the diagnosis of History of esophageal cancer GE Junction Cancer.  The various methods of treatment have been discussed with the patient and family. After consideration of risks, benefits and other options for treatment, the patient has consented to  Procedure(s): ESOPHAGOGASTRODUODENOSCOPY (EGD) WITH PROPOFOL (N/A) as a surgical intervention.  The patient's history has been reviewed, patient examined, no change in status, stable for surgery.  I have reviewed the patient's chart and labs.  Questions were answered to the patient's satisfaction.     Lance Herring

## 2019-10-19 NOTE — Transfer of Care (Signed)
Immediate Anesthesia Transfer of Care Note  Patient: Lance Herring  Procedure(s) Performed: Procedure(s): ESOPHAGOGASTRODUODENOSCOPY (EGD) WITH PROPOFOL (N/A) BIOPSY BALLOON DILATION (N/A)  Patient Location: PACU  Anesthesia Type:General  Level of Consciousness: Alert, Awake, Oriented  Airway & Oxygen Therapy: Patient Spontanous Breathing  Post-op Assessment: Report given to RN  Post vital signs: Reviewed and stable  Last Vitals:  Vitals:   10/19/19 0800  BP: (!) 183/88  Pulse: 87  Resp: 14  Temp: 36.9 C  SpO2: [id number]    Complications: No apparent anesthesia complications

## 2019-10-19 NOTE — Discharge Instructions (Signed)
YOU HAD AN ENDOSCOPIC PROCEDURE TODAY: Refer to the procedure report and other information in the discharge instructions given to you for any specific questions about what was found during the examination. If this information does not answer your questions, please call [place] office at [phone] to clarify.  ° °YOU SHOULD EXPECT: Some feelings of bloating in the abdomen. Passage of more gas than usual. Walking can help get rid of the air that was put into your GI tract during the procedure and reduce the bloating. If you had a lower endoscopy (such as a colonoscopy or flexible sigmoidoscopy) you may notice spotting of blood in your stool or on the toilet paper. Some abdominal soreness may be present for a day or two, also. ° °DIET: Your first meal following the procedure should be a light meal and then it is ok to progress to your normal diet. A half-sandwich or bowl of soup is an example of a good first meal. Heavy or fried foods are harder to digest and may make you feel nauseous or bloated. Drink plenty of fluids but you should avoid alcoholic beverages for 24 hours. If you had a esophageal dilation, please see attached instructions for diet.   ° °ACTIVITY: Your care partner should take you home directly after the procedure. You should plan to take it easy, moving slowly for the rest of the day. You can resume normal activity the day after the procedure however YOU SHOULD NOT DRIVE, use power tools, machinery or perform tasks that involve climbing or major physical exertion for 24 hours (because of the sedation medicines used during the test).  ° °SYMPTOMS TO REPORT IMMEDIATELY: °A gastroenterologist can be reached at any hour. Please call [phone]  for any of the following symptoms:  °Following lower endoscopy (colonoscopy, flexible sigmoidoscopy) °Excessive amounts of blood in the stool  °Significant tenderness, worsening of abdominal pains  °Swelling of the abdomen that is new, acute  °Fever of 100° or  higher  °Following upper endoscopy (EGD, EUS, ERCP, esophageal dilation) °Vomiting of blood or coffee ground material  °New, significant abdominal pain  °New, significant chest pain or pain under the shoulder blades  °Painful or persistently difficult swallowing  °New shortness of breath  °Black, tarry-looking or red, bloody stools ° °FOLLOW UP:  °If any biopsies were taken you will be contacted by phone or by letter within the next 1-3 weeks. Call [phone]  if you have not heard about the biopsies in 3 weeks.  °Please also call with any specific questions about appointments or follow up tests. ° °

## 2019-10-19 NOTE — Anesthesia Postprocedure Evaluation (Signed)
Anesthesia Post Note  Patient: Lance Herring  Procedure(s) Performed: ESOPHAGOGASTRODUODENOSCOPY (EGD) WITH PROPOFOL (N/A ) BIOPSY BALLOON DILATION (N/A )     Patient location during evaluation: Endoscopy Anesthesia Type: MAC Level of consciousness: awake and alert Pain management: pain level controlled Vital Signs Assessment: post-procedure vital signs reviewed and stable Respiratory status: spontaneous breathing, nonlabored ventilation, respiratory function stable and patient connected to nasal cannula oxygen Cardiovascular status: stable and blood pressure returned to baseline Postop Assessment: no apparent nausea or vomiting Anesthetic complications: no    Last Vitals:  Vitals:   10/19/19 0935 10/19/19 0940  BP:    Pulse: 77 76  Resp: 14 15  Temp:    SpO2: 99% 100%    Last Pain:  Vitals:   10/19/19 0940  TempSrc:   PainSc: 0-No pain                 Emali Heyward P Jabes Primo

## 2019-10-19 NOTE — Op Note (Signed)
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 Estimated blood loss:                            None. Estimated Blood Loss:     Estimated blood loss: none. Procedure:                Pre-Anesthesia Assessment:                           - Prior to the procedure, a History and Physical                            was performed, and patient medications and                            allergies were reviewed. The patient's tolerance of                            previous anesthesia was also reviewed. The risks                            and benefits of the procedure and the sedation                            options and risks were discussed with the patient.                            All questions were answered, and informed consent                            was obtained. Prior  Anticoagulants: The patient has                            taken no previous anticoagulant or antiplatelet                            agents. ASA Grade Assessment: II - A patient with                            mild systemic disease. After reviewing the risks                            and benefits, the patient was deemed in                            satisfactory condition to undergo the  procedure.                           After obtaining informed consent, the endoscope was                            passed under direct vision. Throughout the                            procedure, the patient's blood pressure, pulse, and                            oxygen saturations were monitored continuously. The                            [id number]) Olympus ultra slim endoscope                            was introduced through the gastrostomy, and                            advanced to the duodenal bulb. The upper GI                            endoscopy was accomplished without difficulty. The                            patient tolerated the procedure well. Scope In: Scope Out: Findings:      The IR placed gastrostomy tube was removed after the internal balloon       was deflated. I then inserted a pediatric gastroscope into the       gastrostomy site fistula and evaluated the stomach. There was still an       ulcerated mass in the proximal stomach, approximately the same size at       when I evaluated it 12/2018. I sampled the mass with forceps and sent to       pathology. The stomach was otherwise normal. Following removal of the       gastroscope, I reinserted the G-tube and reinflated (with saline) the  internal balloon.      I then used an adult gastoscope inserted via his mouth to evaluate his       esophagus. There was fluid within then esophagus and after suctioning it       the pinpoint distal esophagus stricture was again noted. The distal       esophagus mucosa was obviously inflammed but no clear cancer was noted       proximal to the stricture. I then used a TTS wire guided balloon and       fluroscopy (the wire was clearly in the gastric body) to dilate the GE       junction stricture to 67mm, resulting in typical mucosal tear, minor       oozing. I estimate the stricture length to be 2-3cm. Following dilation       there was immediate reflux of gastric  secretions into his esophagus. Impression:               - Previously noted proximal stomach ulcerated mass                            is still present, approximately unchanged in size.                            Biopsies taken, this appears to be residual                            adenocarcinoma endoscopically.                           - The GE junction stricture is still pinpoint and I                            dilated it to 77mm today given his progressive                            dysphagia, sometimes even to water.                           - The previously placed gastrostomy tube was                            temporarily removed to allow access to the stomach                            via pediatric gastroscope and then it was replaced                            in good position. Moderate Sedation:      Not Applicable - Patient had care per Anesthesia. Recommendation:           - Await pathology results.                           - Patient has a contact number available for  emergencies. The signs and symptoms of potential                            delayed complications were discussed with the                            patient. Return to normal activities tomorrow.                            Written discharge instructions were provided to the                            patient.                           - I recommend liquids only, indefinitely. Can                            repeat endoscopic dilation if he starts to have                            trouble with water, secretions.                           - Continue present medications. Procedure Code(s):        --- Professional ---                           [phone], Unlisted procedure, stomach Diagnosis Code(s):        --- Professional ---                           K92.2, Gastrointestinal hemorrhage, unspecified                           R13.10, Dysphagia, unspecified CPT copyright 2019 American  Medical Association. All rights reserved. The codes documented in this report are preliminary and upon coder review may  be revised to meet current compliance requirements. Milus Banister, MD 10/19/2019 9:26:13 AM This report has been signed electronically. Number of Addenda: 0

## 2019-10-19 NOTE — Anesthesia Preprocedure Evaluation (Addendum)
Anesthesia Evaluation  Patient identified by MRN, date of birth, ID band Patient awake    Reviewed: Allergy & Precautions, NPO status , Patient's Chart, lab work & pertinent test results  Airway Mallampati: III  TM Distance: >3 FB Neck ROM: Full    Dental no notable dental hx. (+)    Pulmonary former smoker,    Pulmonary exam normal breath sounds clear to auscultation       Cardiovascular hypertension, Pt. on medications Normal cardiovascular exam Rhythm:Regular Rate:Normal  ECG: NSR, rate 76  ECHO: 1. Left ventricular ejection fraction, by visual estimation, is 60 to 65%. The left ventricle has normal function. There is moderately increased left ventricular hypertrophy. 2. Elevated left atrial and left ventricular end-diastolic pressures. 3. Left ventricular diastolic Doppler parameters are consistent with impaired relaxation pattern of LV diastolic filling. 4. The average left ventricular global longitudinal strain is -15.3 %. 5. Global right ventricle has normal systolic function.The right ventricular size is normal. No increase in right ventricular wall thickness. 6. Left atrial size was normal. 7. Right atrial size was normal. 8. The pericardial effusion is circumferential. 9. Trivial pericardial effusion is present. 10. The mitral valve is abnormal. Trace mitral valve regurgitation. 11. The tricuspid valve is grossly normal. Tricuspid valve regurgitation was not visualized by color flow Doppler. 12. The aortic valve is tricuspid Aortic valve regurgitation is trivial by color flow Doppler. Mild aortic valve sclerosis without stenosis. 13. The pulmonic valve was grossly normal. Pulmonic valve regurgitation is not visualized by color flow Doppler. 14. The inferior vena cava is normal in size with greater than 50% respiratory variability, suggesting right atrial pressure of 3 mmHg. In comparison to the previous  echocardiogram(s): Prior performed at outside hospital.   Neuro/Psych negative neurological ROS  negative psych ROS   GI/Hepatic Neg liver ROS, Esophageal cancer G-tube Port a cath    Endo/Other  diabetes, Oral Hypoglycemic Agents  Renal/GU Renal disease     Musculoskeletal Gout   Abdominal   Peds  Hematology  (+) anemia ,   Anesthesia Other Findings History of esophageal cancer GE Junction Cancer  Reproductive/Obstetrics                            Anesthesia Physical Anesthesia Plan  ASA: III  Anesthesia Plan: MAC   Post-op Pain Management:    Induction: Intravenous  PONV Risk Score and Plan: 1 and Propofol infusion and Treatment may vary due to age or medical condition  Airway Management Planned: Nasal Cannula  Additional Equipment:   Intra-op Plan:   Post-operative Plan:   Informed Consent: I have reviewed the patients History and Physical, chart, labs and discussed the procedure including the risks, benefits and alternatives for the proposed anesthesia with the patient or authorized representative who has indicated his/her understanding and acceptance.     Dental advisory given  Plan Discussed with: CRNA  Anesthesia Plan Comments:        Anesthesia Quick Evaluation

## 2019-10-19 NOTE — Progress Notes (Signed)
Arrived to Endo and RN stated she just accessed port no longer needs IV team

## 2019-10-23 LAB — SURGICAL PATHOLOGY

## 2019-10-28 ENCOUNTER — Other Ambulatory Visit: Payer: Self-pay | Admitting: Oncology

## 2019-10-30 ENCOUNTER — Ambulatory Visit (HOSPITAL_COMMUNITY)
Admission: RE | Admit: 2019-10-30 | Discharge: 2019-10-30 | Disposition: A | Discharge status: Home / Self Care | Payer: Medicare Other | Source: Ambulatory Visit | Attending: Nurse Practitioner | Admitting: Nurse Practitioner

## 2019-10-30 ENCOUNTER — Other Ambulatory Visit: Payer: Self-pay

## 2019-10-30 ENCOUNTER — Telehealth: Payer: Self-pay | Admitting: Gastroenterology

## 2019-10-30 DIAGNOSIS — Z01810 Encounter for preprocedural cardiovascular examination: Secondary | ICD-10-CM | POA: Diagnosis present

## 2019-10-30 DIAGNOSIS — I10 Essential (primary) hypertension: Secondary | ICD-10-CM

## 2019-10-30 DIAGNOSIS — Z9221 Personal history of antineoplastic chemotherapy: Secondary | ICD-10-CM | POA: Diagnosis not present

## 2019-10-30 DIAGNOSIS — E1122 Type 2 diabetes mellitus with diabetic chronic kidney disease: Secondary | ICD-10-CM | POA: Insufficient documentation

## 2019-10-30 DIAGNOSIS — C155 Malignant neoplasm of lower third of esophagus: Secondary | ICD-10-CM | POA: Diagnosis not present

## 2019-10-30 DIAGNOSIS — N189 Chronic kidney disease, unspecified: Secondary | ICD-10-CM | POA: Insufficient documentation

## 2019-10-30 DIAGNOSIS — I313 Pericardial effusion (noninflammatory): Secondary | ICD-10-CM | POA: Insufficient documentation

## 2019-10-30 DIAGNOSIS — I129 Hypertensive chronic kidney disease with stage 1 through stage 4 chronic kidney disease, or unspecified chronic kidney disease: Secondary | ICD-10-CM | POA: Diagnosis not present

## 2019-10-30 DIAGNOSIS — M109 Gout, unspecified: Secondary | ICD-10-CM | POA: Diagnosis not present

## 2019-10-30 NOTE — Telephone Encounter (Signed)
The pt has been scheduled for 11/28/19 at 150 pm with Dr Ardis Hughs. The patient has been notified of this information and all questions answered.

## 2019-10-30 NOTE — Telephone Encounter (Signed)
Patty, I signed his recent path note without sending it by accident.  Can you please look at his recent path result note, he needs OV with me, next available  Leroy Sea, FYI, see his recent path result.  Thanks

## 2019-10-30 NOTE — Progress Notes (Signed)
  Echocardiogram 2D Echocardiogram has been performed.  [name] M 10/30/2019, 11:53 AM

## 2019-11-02 ENCOUNTER — Inpatient Hospital Stay: Payer: Medicare Other | Attending: Oncology | Admitting: Nurse Practitioner

## 2019-11-02 ENCOUNTER — Encounter: Payer: Self-pay | Admitting: Nurse Practitioner

## 2019-11-02 ENCOUNTER — Inpatient Hospital Stay: Payer: Medicare Other

## 2019-11-02 ENCOUNTER — Other Ambulatory Visit: Payer: Self-pay

## 2019-11-02 VITALS — BP 151/81 | HR 87 | Temp 99.3°F | Resp 17 | Ht 71.0 in | Wt 186.4 lb

## 2019-11-02 DIAGNOSIS — C155 Malignant neoplasm of lower third of esophagus: Secondary | ICD-10-CM

## 2019-11-02 DIAGNOSIS — Z5112 Encounter for antineoplastic immunotherapy: Secondary | ICD-10-CM | POA: Insufficient documentation

## 2019-11-02 DIAGNOSIS — C159 Malignant neoplasm of esophagus, unspecified: Secondary | ICD-10-CM | POA: Diagnosis not present

## 2019-11-02 DIAGNOSIS — Z5111 Encounter for antineoplastic chemotherapy: Secondary | ICD-10-CM | POA: Diagnosis present

## 2019-11-02 DIAGNOSIS — Z95828 Presence of other vascular implants and grafts: Secondary | ICD-10-CM

## 2019-11-02 LAB — CMP (CANCER CENTER ONLY)
ALT: 29 U/L (ref 0–44)
AST: 25 U/L (ref 15–41)
Albumin: 3.5 g/dL (ref 3.5–5.0)
Alkaline Phosphatase: 148 U/L — ABNORMAL HIGH (ref 38–126)
Anion gap: 9 (ref 5–15)
BUN: 33 mg/dL — ABNORMAL HIGH (ref 8–23)
CO2: 29 mmol/L (ref 22–32)
Calcium: 9.2 mg/dL (ref 8.9–10.3)
Chloride: 100 mmol/L (ref 98–111)
Creatinine: 1.53 mg/dL — ABNORMAL HIGH (ref 0.61–1.24)
GFR, Est AFR Am: 52 mL/min — ABNORMAL LOW (ref >60)
GFR, Estimated: 45 mL/min — ABNORMAL LOW (ref >60)
Glucose, Bld: 137 mg/dL — ABNORMAL HIGH (ref 70–99)
Potassium: 3.8 mmol/L (ref 3.5–5.1)
Sodium: 138 mmol/L (ref 135–145)
Total Bilirubin: 0.4 mg/dL (ref 0.3–1.2)
Total Protein: 7.9 g/dL (ref 6.5–8.1)

## 2019-11-02 LAB — CBC WITH DIFFERENTIAL (CANCER CENTER ONLY)
Abs Immature Granulocytes: 0.03 10*3/uL (ref 0.00–0.07)
Basophils Absolute: 0 10*3/uL (ref 0.0–0.1)
Basophils Relative: 0 %
Eosinophils Absolute: 0.1 10*3/uL (ref 0.0–0.5)
Eosinophils Relative: 2 %
HCT: 33.7 % — ABNORMAL LOW (ref 39.0–52.0)
Hemoglobin: 11.1 g/dL — ABNORMAL LOW (ref 13.0–17.0)
Immature Granulocytes: 0 %
Lymphocytes Relative: 11 %
Lymphs Abs: 0.9 10*3/uL (ref 0.7–4.0)
MCH: 30.3 pg (ref 26.0–34.0)
MCHC: 32.9 g/dL (ref 30.0–36.0)
MCV: 92.1 fL (ref 80.0–100.0)
Monocytes Absolute: 1.1 10*3/uL — ABNORMAL HIGH (ref 0.1–1.0)
Monocytes Relative: 13 %
Neutro Abs: 6 10*3/uL (ref 1.7–7.7)
Neutrophils Relative %: 74 %
Platelet Count: 201 10*3/uL (ref 150–400)
RBC: 3.66 MIL/uL — ABNORMAL LOW (ref 4.22–5.81)
RDW: 18.6 % — ABNORMAL HIGH (ref 11.5–15.5)
WBC Count: 8.2 10*3/uL (ref 4.0–10.5)
nRBC: 0 % (ref 0.0–0.2)

## 2019-11-02 MED ORDER — ACETAMINOPHEN 160 MG/5ML PO SOLN
650.0000 mg | Freq: Once | ORAL | Status: AC
  Administered 2019-11-02: 650 mg via ORAL

## 2019-11-02 MED ORDER — SODIUM CHLORIDE 0.9 % IV SOLN
2400.0000 mg/m2 | INTRAVENOUS | Status: DC
  Administered 2019-11-02: 4750 mg via INTRAVENOUS
  Filled 2019-11-02: qty 95

## 2019-11-02 MED ORDER — DIPHENHYDRAMINE HCL 50 MG/ML IJ SOLN
25.0000 mg | Freq: Once | INTRAMUSCULAR | Status: AC
  Administered 2019-11-02: 25 mg via INTRAVENOUS

## 2019-11-02 MED ORDER — SODIUM CHLORIDE 0.9% FLUSH
10.0000 mL | INTRAVENOUS | Status: DC | PRN
  Administered 2019-11-02: 10 mL
  Filled 2019-11-02: qty 10

## 2019-11-02 MED ORDER — ACETAMINOPHEN 160 MG/5ML PO SOLN
ORAL | Status: AC
  Filled 2019-11-02: qty 20.3

## 2019-11-02 MED ORDER — FLUOROURACIL CHEMO INJECTION 2.5 GM/50ML
400.0000 mg/m2 | Freq: Once | INTRAVENOUS | Status: AC
  Administered 2019-11-02: 800 mg via INTRAVENOUS
  Filled 2019-11-02: qty 16

## 2019-11-02 MED ORDER — SODIUM CHLORIDE 0.9 % IV SOLN
Freq: Once | INTRAVENOUS | Status: AC
  Filled 2019-11-02: qty 250

## 2019-11-02 MED ORDER — DEXTROSE 5 % IV SOLN
Freq: Once | INTRAVENOUS | Status: AC
  Filled 2019-11-02: qty 250

## 2019-11-02 MED ORDER — DIPHENHYDRAMINE HCL 50 MG/ML IJ SOLN
INTRAMUSCULAR | Status: AC
  Filled 2019-11-02: qty 1

## 2019-11-02 MED ORDER — LEUCOVORIN CALCIUM INJECTION 350 MG
400.0000 mg/m2 | Freq: Once | INTRAVENOUS | Status: AC
  Administered 2019-11-02: 788 mg via INTRAVENOUS
  Filled 2019-11-02: qty 39.4

## 2019-11-02 MED ORDER — TRASTUZUMAB-DKST CHEMO 150 MG IV SOLR
450.0000 mg | Freq: Once | INTRAVENOUS | Status: AC
  Administered 2019-11-02: 450 mg via INTRAVENOUS
  Filled 2019-11-02: qty 21.43

## 2019-11-02 MED ORDER — ACETAMINOPHEN 325 MG PO TABS
ORAL_TABLET | ORAL | Status: AC
  Filled 2019-11-02: qty 2

## 2019-11-02 NOTE — Progress Notes (Signed)
Called agency to inform of pump D/C and port deaccess time at 1100.

## 2019-11-02 NOTE — Progress Notes (Signed)
Per Ned Card, NP, OK to treat with creatinine 1.53

## 2019-11-02 NOTE — Progress Notes (Signed)
11/02/19  Maintain todays trastuzumab dose at 450 mg despite weight increase >10%.  T.O.  Ned Card, NP/Karter Hellmer Ronnald Ramp, PharmD

## 2019-11-02 NOTE — Patient Instructions (Addendum)
[facility] Discharge Instructions for Patients Receiving Chemotherapy  Today you received the following chemotherapy agents:  Ogivri, Leucovorin, Fluorouracil  To help prevent nausea and vomiting after your treatment, we encourage you to take your nausea medication as prescribed.   If you develop nausea and vomiting that is not controlled by your nausea medication, call the clinic.   BELOW ARE SYMPTOMS THAT SHOULD BE REPORTED IMMEDIATELY:  *FEVER GREATER THAN 100.5 F  *CHILLS WITH OR WITHOUT FEVER  NAUSEA AND VOMITING THAT IS NOT CONTROLLED WITH YOUR NAUSEA MEDICATION  *UNUSUAL SHORTNESS OF BREATH  *UNUSUAL BRUISING OR BLEEDING  TENDERNESS IN MOUTH AND THROAT WITH OR WITHOUT PRESENCE OF ULCERS  *URINARY PROBLEMS  *BOWEL PROBLEMS  UNUSUAL RASH Items with * indicate a potential emergency and should be followed up as soon as possible.  Feel free to call the clinic should you have any questions or concerns. The clinic phone number is [phone].  Please show the [facility] at check-in to the Emergency Department and triage nurse.

## 2019-11-02 NOTE — Progress Notes (Addendum)
[place] OFFICE PROGRESS NOTE   Diagnosis: Gastroesophageal cancer  INTERVAL HISTORY:   Lance Herring returns as scheduled.  He completed another cycle of 5-FU/Herceptin 10/05/2019.  He denies nausea/vomiting.  No mouth sores.  No diarrhea.  He notes some areas of peeling on the hands.  No fever, cough, shortness of breath.  He is tolerating liquids without difficulty.  Objective:  Vital signs in last 24 hours:  Blood pressure (!) 151/81, pulse 87, temperature 99.3 F (37.4 C), temperature source Temporal, resp. rate 17, height _0  (1.803 m), weight 186 lb 6.4 oz (84.6 kg), SpO2 100 %.    HEENT: Mild white coating over tongue.  No buccal thrush. GI: Abdomen soft and nontender.  No hepatomegaly.  Left abdomen feeding tube site without erythema. Vascular: No leg edema. Neuro: Alert and oriented. Skin: Scattered areas of peeling on the hands. Port-A-Cath without erythema.   Lab Results:  Lab Results  Component Value Date   WBC 8.2 11/02/2019   HGB 11.1 (L) 11/02/2019   HCT 33.7 (L) 11/02/2019   MCV 92.1 11/02/2019   PLT 201 11/02/2019   NEUTROABS 6.0 11/02/2019    Imaging:  No results found.  Medications: I have reviewed the patient's current medications.  Assessment/Plan: 1. 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 Partially obstructing esophagus mass 02/01/2017, biopsy confirmed moderately differentiated adenocarcinoma ? Staging CTs 03/06/2017-esophagus mass, single noncalcified pulmonary nodule the medial aspect of the left lower lobe, evidence of prior granulomatous disease, right adrenal adenoma ? PET scan 04/07/2017-hypermetabolic distal esophageal mass, no evidence of metastatic disease in the abdomen, pelvis, or skeleton. The right adrenal mass on CT was not hypermetabolic and favored to be an adenoma ? Radiation and concurrent weekly Taxol/carboplatin 04/20/2017-05/28/2017 ? CTs 07/05/2018-improvement in  esophagus mass ? Endoscopy 07/26/2017-ulceration and friability from the GE junction up to 35 cm, 2 cm mass at the GE junction-improved in size, biopsy revealed no malignancy ? PET scan 08/19/2017-hypermetabolic activity at the distal esophagus and GE junction, no evidence of distant metastatic disease ? Admission to [facility] 11/18/2018 with a syncope event secondary to upper GI bleeding ? Upper endoscopy 11/18/2018-obstructing mass at 40 cm, biopsies revealed inflammation and no malignancy ? CTs 11/20/2018-10 mm nodule at the left lung apex, thickening at the distal esophagus, evidence of prior granulomatous disease with calcified right lung nodules and right paratracheal/hilar lymph nodes ? Upper endoscopy 12/07/2026-pinpoint stricture in the distal esophagus, lumen was 1 mm. Gastroesophageal mucosa with exudate consistent with ulcer. No malignancy.  ? PET scan 12/19/2018-focal area of increased uptake at or just below the level of the GE junction; moderate FDG uptake associated with left apical lung nodule. Bilateral pleural effusions and lower lobe atelectasis. ? Endoscopy via gastrostomy tube tract 01/05/2019-mass at the gastric cardia/GE junction, biopsy confirmed adenocarcinoma, CPS-5,Her-2 3+ ? Cycle 1 FOLFOX 01/18/2019 ? Cycle 2 FOLFOX 02/02/2019 ? Cycle 3 FOLFOX 02/16/2019 ? Cycle 4 FOLFOX 03/02/2019 ? Cycle 5 FOLFOX 03/16/2019 ? CT chest6/01/2019-new cavitary change in the 1 cm left upper lobe pulmonary nodule, stable in size. Stable circumferential wall thickening of the lower thoracic esophagus/GE junction, no progressive disease ? Cycle 6 FOLFOX 03/30/2019, Herceptin added ? Cycle 7 FOLFOX/Herceptin 04/13/2019 ? Cycle 8 FOLFOX/Herceptin 05/09/2019 ? Cycle 9 FOLFOX/Herceptin 05/25/2019 (oxaliplatin held due to neutropenia) ? Cycle 10 FOLFOX/Herceptin 06/08/2019 ? CT chest 06/21/2019-no evidence of metastatic disease. Small cyst in the apical left upper lobe appears slightly  less thick-walled than on prior  exam. Distal esophageal wall thickening as before. Trace left pleural effusion. ? Cycle 11 5-FU/Herceptin 06/21/2019 ? Cycle125-FU/Herceptin 07/12/2019 ? Cycle135-FU/Herceptin 08/03/2019 ? Cycle 14 5-FU/Herceptin 08/24/2019 ? Cycle 15 5-FU/Herceptin 09/14/2019 ? CT chest 10/05/2019-progressive mid and upper esophageal distention.  No obvious mass.  Stable 8 mm left apical pulmonary nodule.  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  New inflammatory or atypical infectious process in the lung bases and new small bilateral pleural effusions. ? Cycle 16 5-FU/Herceptin 10/05/2019 ? Upper endoscopy 10/19/2019-previously noted proximal stomach ulcerated mass still present unchanged in size (biopsy proximal stomach with isolated microscopic fragment with at least intramucosal adenocarcinoma).  GE junction stricture pinpoint, dilated to 7 mm ? Cycle 17 5-FU/Herceptin 11/02/2019 2. Upper GI bleeding secondary to #1, 11/18/2018-transfused with a total of 4 units of packed red blood cells 3. Nutrition-placement of gastrostomy feeding tube 11/22/2018  Feeding tube placed initial treatment in 2018, removed May 2019  4.Diabetes 5.Hypertension 6.Gout 7.Chronic renal failure 8. Admission 04/27/2019 with severe hyperglycemia, weight loss, and dehydration 9. 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 appears stable.  He continues to tolerate 5-FU/Herceptin well.  We reviewed the results of the recent upper endoscopy.  He understands the stomach mass is still present and biopsy shows cancer cells.  Dr. Benay Spice recommends continuation of 5-FU/Herceptin.  Mr. Stefanski agrees.   We reviewed the CBC and chemistry panel from today, adequate for treatment.   He notes improvement in the  dysphagia following dilatation.  He is scheduled to follow-up with Dr. Ardis Hughs early next month.  He continues tube feedings as his main source of nutrition.  He would like a referral to Dr. Servando Snare discussed the possibility of surgery.  Referral entered.  He will return for lab, follow-up, 5-FU Herceptin in 3 weeks.  He will contact the office in the interim with any problems.  Patient seen with Dr. Benay Spice.    Ned Card ANP/GNP-BC   11/02/2019  9:25 AM This was a shared visit with Ned Card.  Mr. Robarts appears stable.  The dysphagia improved following the endoscopic procedure by Dr. Ardis Hughs.  He will follow up with Dr. Ardis Hughs to discuss repeat dilation procedures versus stent placement.  He would like to see Dr. Servando Snare to discuss resection of the gastroesophageal primary.  The plan is to continue 5-FU/Herceptin as there is no evidence of metastatic disease.  Julieanne Manson, MD

## 2019-11-03 ENCOUNTER — Encounter: Payer: Self-pay | Admitting: *Deleted

## 2019-11-03 NOTE — Progress Notes (Signed)
Oncology Nurse Navigator Documentation  Oncology Nurse Navigator Flowsheets 11/03/2019  Abnormal Finding Date -  Navigator Location [facility]  Referral Date to RadOnc/MedOnc -  Navigator Encounter Type Other/I received message from [facility] that patient has been referred to Dr. Servando Snare.  I contacted his office to make sure they received referral and if they need me to help getting appt.    Treatment Phase -  Barriers/Navigation Needs Coordination of Care  Interventions Coordination of Care  Acuity Level 2-Minimal Needs (1-2 Barriers Identified)  Coordination of Care Other  Support Groups/Services -  Time Spent with Patient 15

## 2019-11-04 ENCOUNTER — Inpatient Hospital Stay: Payer: Medicare Other

## 2019-11-14 ENCOUNTER — Other Ambulatory Visit: Payer: Self-pay

## 2019-11-14 ENCOUNTER — Institutional Professional Consult (permissible substitution) (INDEPENDENT_AMBULATORY_CARE_PROVIDER_SITE_OTHER): Payer: Medicare Other | Admitting: Cardiothoracic Surgery

## 2019-11-14 VITALS — BP 175/78 | HR 92 | Temp 97.7°F | Resp 20 | Ht 71.0 in | Wt 178.0 lb

## 2019-11-14 DIAGNOSIS — C155 Malignant neoplasm of lower third of esophagus: Secondary | ICD-10-CM

## 2019-11-14 NOTE — Progress Notes (Signed)
Lance Herring       [place] [address]             [phone]                    Lance Herring [place] Medical Record #[id number] Date of Birth: [birth year]  Referring: Riki Sheer, MD Primary Care: [facility] Cardiologist: No primary care provider on file.  Chief Complaint:    Chief Complaint  Patient presents with  . Esophageal Cancer    Surgical eval,  Upper GI endoscopy 10/19/19, Chest CT 10/04/20, ECHO 11/19/19, last PET Scan 12/19/18    History of Present Illness:    Lance Herring 74 y.o. male is seen in the office  today for consideration of esophagectomy.  Patient was originally seen when he was hospitalized in January 2020.  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   he had presented with a obstructing esophageal mass and underwent radiation and chemotherapy during 03/2017- 05/2017  reports suggest there may been retroperitoneal lymph lymphadenopathy and pulmonary nodules were being evaluated notes indicate that restaging CT scans done last year and showed response to treatment patient was seen previously by surgeon in [place] was thought too frail for surgery in August 2019.  the patient had declined surgery.  He presented January 2020 with continued weight loss difficulty swallowing and GI bleed with endoscopy was done in [facility] January 24 showing stricture at the esophageal junction scope could not transverse the lesion and full exam of the stomach could not be completed.  PET scan done in early 2020 suggested possibility of a pulmonary metastasis left upper lobe question of adrenal involvement and fragility he was not considered an operative candidate.  Currently he has been receiving Herceptin.    At some point in the past the patient had a PEG tube which supplies his total nutrition   Current Activity/ Functional Status:  Patient is independent  with mobility/ambulation, transfers, ADL's, IADL's.   Zubrod Score: At the time of surgery this patient's most appropriate activity status/level should be described as: _0     0    Normal activity, no symptoms _1     1    Restricted in physical strenuous activity but ambulatory, able to do out light work _2     2    Ambulatory and capable of self care, unable to do work activities, up and about               >50 % of waking hours                              _3     3    Only limited self care, in bed greater than 50% of waking hours _4     4    Completely disabled, no self care, confined to bed or chair _5     5    Moribund   Past Medical History:  Diagnosis Date  . Cataract   . CKD (chronic kidney disease) stage 3, GFR 30-59 ml/min   . DM2 (diabetes mellitus, type 2) ([place])   . Esophageal cancer ([place]) dx'd 2017  . Glaucoma   . Gout   . HTN (hypertension)     Past Surgical History:  Procedure Laterality Date  . BALLOON DILATION N/A 10/19/2019   Procedure:  BALLOON DILATION;  Surgeon: Milus Banister, MD;  Location: [facility];  Service: Endoscopy;  Laterality: N/A;  . BIOPSY  01/05/2019   Procedure: BIOPSY;  Surgeon: Milus Banister, MD;  Location: [facility];  Service: Endoscopy;;  . BIOPSY  10/19/2019   Procedure: BIOPSY;  Surgeon: Milus Banister, MD;  Location: [facility];  Service: Endoscopy;;  . ESOPHAGOGASTRODUODENOSCOPY (EGD) WITH PROPOFOL N/A 01/05/2019   Procedure: ESOPHAGOGASTRODUODENOSCOPY (EGD) WITH PROPOFOL;  Surgeon: Milus Banister, MD;  Location: [facility];  Service: Endoscopy;  Laterality: N/A;  . ESOPHAGOGASTRODUODENOSCOPY (EGD) WITH PROPOFOL N/A 10/19/2019   Procedure: ESOPHAGOGASTRODUODENOSCOPY (EGD) WITH PROPOFOL;  Surgeon: Milus Banister, MD;  Location: [facility];  Service: Endoscopy;  Laterality: N/A;  . IR GASTROSTOMY TUBE MOD SED  11/22/2018  . IR REPLC GASTRO/COLONIC TUBE PERCUT W/FLUORO  07/12/2019  . ORCHIECTOMY Right    Benign    Family  History  Problem Relation Age of Onset  . Stomach cancer Neg Hx   . Esophageal cancer Neg Hx   . Pancreatic cancer Neg Hx   . Colon cancer Neg Hx   . Liver disease Neg Hx      Social History   Tobacco Use  Smoking Status Former Smoker  . Types: Cigarettes  . Quit date: 85  . Years since quitting: 41.0  Smokeless Tobacco Never Used    Social History   Substance and Sexual Activity  Alcohol Use Never     No Known Allergies  Current Outpatient Medications  Medication Sig Dispense Refill  . allopurinol (ZYLOPRIM) 100 MG tablet Take 100 mg by mouth daily.    Marland Kitchen amLODipine (NORVASC) 5 MG tablet Take 5 mg by mouth daily.    Marland Kitchen gabapentin (NEURONTIN) 100 MG capsule Take 1 capsule (100 mg total) by mouth 2 (two) times daily. 60 capsule 1  . gabapentin (NEURONTIN) 300 MG capsule Take 1 capsule (300 mg total) by mouth at bedtime. 30 capsule 5  . glimepiride (AMARYL) 4 MG tablet Take 4 mg by mouth daily.    Marland Kitchen lidocaine-prilocaine (EMLA) cream Apply 1 application topically as needed. (Patient taking differently: Apply 1 application topically as needed (port). ) 30 g 0  . Nutritional Supplements (FEEDING SUPPLEMENT, GLUCERNA 1.5 CAL,) LIQD Give 2 cartons of formula at 7am and 11am,and 1 carton at 3pm and 7pm.   Flush with 171m water before and after each feeding. (Patient taking differently: Give 2 cartons of formula at each meal   Flush with 1256mwater before and after each feeding.) 1422 mL 5  . pioglitazone (ACTOS) 45 MG tablet Take 45 mg by mouth daily.    . potassium chloride (KLOR-CON) 20 MEQ packet PLACE 20 MEQ INTO FEEDING TUBE DAILY AS DIRECTED 90 packet 1  . promethazine (PHENERGAN) 6.25 MG/5ML syrup Take 10 mLs (12.5 mg total) by mouth every 6 (six) hours as needed for nausea or vomiting. 120 mL 1  . Travoprost, BAK Free, (TRAVATAN) 0.004 % SOLN ophthalmic solution Place 1 drop into both eyes every evening.     No current facility-administered medications for this visit.    Facility-Administered Medications Ordered in Other Visits  Medication Dose Route Frequency Provider Last Rate Last Admin  . 0.9 %  sodium chloride infusion   Intravenous Continuous ShLadell PierMD   Stopped at 10/05/19 1516  . 0.9 %  sodium chloride infusion   Intravenous Once ShLadell PierMD        Pertinent items are noted in HPI.  Review of Systems:     Cardiac Review of Systems: [Y] = yes  or   [ N ] = no   Chest Pain [ n   ]  Resting SOB Florencio.Farrier   ] Exertional SOB  Blue.Reese  ]  Orthopnea [ n ]   Pedal Edema [  n ]    Palpitations [n  ] Syncope  [ n ]   Presyncope [ n  ]   General Review of Systems: [Y] = yes [  ]=no Constitional: recent weight change [  ];  Wt loss over the last 3 months [   ] anorexia [  ]; fatigue [  ]; nausea [  ]; night sweats [  ]; fever [  ]; or chills [  ];           Eye : blurred vision [  ]; diplopia [   ]; vision changes [  ];  Amaurosis fugax[  ]; Resp: cough [  ];  wheezing[  ];  hemoptysis[  ]; shortness of breath[  ]; paroxysmal nocturnal dyspnea[  ]; dyspnea on exertion[  ]; or orthopnea[  ];  GI:  gallstones[  ], vomiting[ y ];  dysphagia[y  ]; melena[  ];  hematochezia [  ]; heartburn[y  ];   Hx of  Colonoscopy[  ]; GU: kidney stones [  ]; hematuria[  ];   dysuria [  ];  nocturia[  ];  history of     obstruction [  ]; urinary frequency [  ]             Skin: rash, swelling[  ];, hair loss[  ];  peripheral edema[  ];  or itching[  ]; Musculosketetal: myalgias[  ];  joint swelling[  ];  joint erythema[  ];  joint pain[  ];  back pain[  ];  Heme/Lymph: bruising[  ];  bleeding[  ];  anemia[  ];  Neuro: TIA[  ];  headaches[  ];  stroke[  ];  vertigo[  ];  seizures[  ];   paresthesias[  ];  difficulty walking[  ];  Psych:depression[  ]; anxiety[  ];  Endocrine: diabetes[  ];  thyroid dysfunction[  ];  Immunizations: Flu up to date [  ]; Pneumococcal up to date [  ];  Other:     PHYSICAL EXAMINATION: BP (!) 175/78   Pulse 92   Temp 97.7 F (36.5  C) (Skin)   Resp 20   Ht _0  (1.803 m)   Wt 80.7 kg   SpO2 95%   BMI 24.83 kg/m  General appearance: alert, cooperative, cachectic and no distress Head: Normocephalic, without obvious abnormality, atraumatic Neck: no adenopathy, no carotid bruit, no JVD, supple, symmetrical, trachea midline and thyroid not enlarged, symmetric, no tenderness/mass/nodules Lymph nodes: Cervical, supraclavicular, and axillary nodes normal. Resp: clear to auscultation bilaterally Back: symmetric, no curvature. ROM normal. No CVA tenderness. Cardio: regular rate and rhythm, S1, S2 normal, no murmur, click, rub or gallop GI: soft, non-tender; bowel sounds normal; no masses,  no organomegaly Extremities: extremities normal, atraumatic, no cyanosis or edema and Homans sign is negative, no sign of DVT Neurologic: Grossly normal Exam in the office today patient appears frail, was able to walk in the hall slowly, but did not even appear that he could do a 6-minute walk test  Diagnostic Studies & Laboratory data:     Recent Radiology Findings:   DG ESOPHAGUS DILATION  Result Date: 10/19/2019 ESOPHAGEAL DILATATION:  Fluoroscopy was provided for use by the requesting physician.  No images were obtained for radiographic interpretation.  ECHOCARDIOGRAM COMPLETE  Result Date: 10/30/2019   ECHOCARDIOGRAM REPORT   Patient Name:   Lance Herring [facility] Date of Exam: 10/30/2019 Medical Rec #:  [id number]          Height:       71.0 in Accession #:    [id number]         Weight:       179.8 lb Date of Birth:  Aug 16, 1946          BSA:          2.02 m Patient Age:    7 years           BP:           156/77 mmHg Patient Gender: M                  HR:           76 bpm. Exam Location:  Outpatient Procedure: 2D Echo and Strain Analysis Indications:    Chemotherapy evaluation v87.41 / v58.11  History:        Patient has prior history of Echocardiogram examinations, most                 recent 07/26/2019. Risk Factors:Hypertension and  Diabetes.                 Chronic kidney disease. Esophageal cancer. Gout.  Sonographer:    [name] RDCS Referring Phys: [place]  1. Compared to prior TTE on 6/31/49, LV systolic function appears grossly normal. However, there is poor visualization of apex and part of lateral wall on current study, and average global longitudinal strain is reduced on current study (-11.1%) compared to prior (-15.3%). Recommend repeat limited TTE with contrast for better evaluation of wall motion abnormalities.  2. Left ventricular ejection fraction, by visual estimation, is 55 to 60%. The left ventricle has normal function. There is moderately increased left ventricular hypertrophy.  3. The average left ventricular global longitudinal strain is -11.1 %.  4. Left ventricular diastolic parameters are consistent with Grade II diastolic dysfunction (pseudonormalization).  5. Elevated left atrial pressure.  6. Global right ventricle has normal systolic function.The right ventricular size is normal. No increase in right ventricular wall thickness.  7. Left atrial size was normal.  8. Right atrial size was normal.  9. Small pericardial effusion. 10. The mitral valve is normal in structure. No evidence of mitral valve regurgitation. 11. The tricuspid valve is normal in structure. 12. The aortic valve is tricuspid. Aortic valve regurgitation is trivial. No evidence of aortic valve stenosis. 13. The pulmonic valve was not well visualized. Pulmonic valve regurgitation is trivial. 14. Mildly elevated pulmonary artery systolic pressure. FINDINGS  Left Ventricle: Left ventricular ejection fraction, by visual estimation, is 55 to 60%. The left ventricle has normal function. The average left ventricular global longitudinal strain is -11.1 %. The left ventricle has no regional wall motion abnormalities. The left ventricular internal cavity size was the left ventricle is normal in size. There is moderately increased left  ventricular hypertrophy. Left ventricular diastolic parameters are consistent with Grade II diastolic dysfunction (pseudonormalization). Elevated left atrial pressure. Right Ventricle: The right ventricular size is normal. No increase in right ventricular wall thickness. Global RV systolic function is has normal systolic function. The tricuspid regurgitant velocity is 2.82 m/s, and with an assumed right  atrial pressure  of 3 mmHg, the estimated right ventricular systolic pressure is mildly elevated at 34.8 mmHg. Left Atrium: Left atrial size was normal in size. Right Atrium: Right atrial size was normal in size Pericardium: A small pericardial effusion is present. Mitral Valve: The mitral valve is normal in structure. No evidence of mitral valve regurgitation. Tricuspid Valve: The tricuspid valve is normal in structure. Tricuspid valve regurgitation is trivial. Aortic Valve: The aortic valve is tricuspid. Aortic valve regurgitation is trivial. The aortic valve is structurally normal, with no evidence of sclerosis or stenosis. Pulmonic Valve: The pulmonic valve was not well visualized. Pulmonic valve regurgitation is trivial. Pulmonic regurgitation is trivial. Aorta: The aortic root and ascending aorta are structurally normal, with no evidence of dilitation. IAS/Shunts: The interatrial septum was not well visualized.  LEFT VENTRICLE PLAX 2D LVIDd:         4.90 cm       Diastology LVIDs:         3.40 cm       LV e' lateral:   4.80 cm/s LV PW:         1.30 cm       LV E/e' lateral: 19.4 LV IVS:        1.20 cm       LV e' medial:    4.73 cm/s LVOT diam:     2.10 cm       LV E/e' medial:  19.7 LV SV:         65 ml LV SV Index:   32.22         2D Longitudinal Strain LVOT Area:     3.46 cm      2D Strain GLS Avg:     -11.1 %  LV Volumes (MOD) LV area d, A2C:    34.90 cm LV area d, A4C:    33.20 cm LV area s, A2C:    25.00 cm LV area s, A4C:    22.20 cm LV major d, A2C:   8.45 cm LV major d, A4C:   8.85 cm LV major s,  A2C:   7.51 cm LV major s, A4C:   6.97 cm LV vol d, MOD A2C: 119.0 ml LV vol d, MOD A4C: 103.0 ml LV vol s, MOD A2C: 69.4 ml LV vol s, MOD A4C: 59.3 ml LV SV MOD A2C:     49.6 ml LV SV MOD A4C:     103.0 ml LV SV MOD BP:      46.7 ml RIGHT VENTRICLE RV S prime:     11.30 cm/s TAPSE (M-mode): 1.8 cm LEFT ATRIUM             Index LA diam:        4.10 cm 2.03 cm/m LA Vol (A2C):   55.1 ml 27.34 ml/m LA Vol (A4C):   55.6 ml 27.59 ml/m LA Biplane Vol: 60.6 ml 30.07 ml/m  AORTIC VALVE LVOT Vmax:   83.60 cm/s LVOT Vmean:  61.600 cm/s LVOT VTI:    0.185 m  AORTA Ao Root diam: 3.30 cm MITRAL VALVE                        TRICUSPID VALVE MV Area (PHT): 3.03 cm             TR Peak grad:   31.8 mmHg MV PHT:        72.50 msec  TR Vmax:        282.00 cm/s MV Decel Time: 250 msec MV E velocity: 93.00 cm/s 103 cm/s  SHUNTS MV A velocity: 96.00 cm/s 70.3 cm/s Systemic VTI:  0.18 m MV E/A ratio:  0.97       1.5       Systemic Diam: 2.10 cm  Oswaldo Milian MD Electronically signed by Oswaldo Milian MD Signature Date/Time: 10/30/2019/11:50:52 PM    Final     CLINICAL DATA:  Restaging esophageal cancer. Initial diagnosis 2017.  EXAM: CT CHEST WITHOUT CONTRAST  TECHNIQUE: Multidetector CT imaging of the chest was performed following the standard protocol without IV contrast.  COMPARISON:  06/21/2019  FINDINGS: Cardiovascular: The heart is upper limits of normal in size and stable. Small amount of pericardial fluid but no overt effusion. Stable tortuosity and calcification of the thoracic aorta. The right-sided Port-A-Cath is stable. Stable coronary artery calcifications.  Mediastinum/Nodes: No obvious mediastinal or hilar adenopathy. There are scattered small lymph nodes. Numerous calcified right hilar and mediastinal nodes are again noted.  Progressive and fairly marked distention of the mid and upper esophagus. Maximum diameter is 5 cm and the esophagus is filled with fluid and  small amount of air. Findings could be due to progressive stricture ring distally possibly due to radiation change. Could not exclude the possibility of recurrent tumor on this exam without IV contrast but I do not see any obvious distal esophageal mass.  Lungs/Pleura: Stable emphysematous changes and pulmonary scarring.  Stable irregular nodular density at the left lung apex measuring 8 mm.  Progressive lower paramediastinal airspace consolidation and bronchiectasis most likely progressive radiation fibrosis in both lower lungs.  New patchy areas of tree-in-bud nodularity and faint airspace opacities likely inflammatory process or atypical infection such as MAC. I do not see any worrisome pulmonary nodules to suggest pulmonary metastatic disease. There are stable calcified granulomas in the right lung. New small bilateral pleural effusions with overlying atelectasis.  Upper Abdomen: Stable feeding gastrostomy tube in the antral region of the stomach. Bilateral adrenal gland nodules are stable and likely benign adenomas. Stable advanced vascular calcifications involving the aorta. No upper abdominal mass or adenopathy. No obvious hepatic lesions without contrast. Stable calcified granulomas.  Musculoskeletal: No significant bony findings.  IMPRESSION: 1. Progressive mid and upper esophageal distension. This could be due to progressive distal stricturing related to radiation. Recurrent tumor is also a possibility although I do not see an obvious mass without contrast. Further evaluation with contrast esophagram or endoscopy may be helpful for further evaluation. 2. No mediastinal or hilar adenopathy. Stable small calcified and noncalcified lymph nodes. 3. Stable 8 mm left apical pulmonary nodule. 4. 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 5. New inflammatory or atypical infectious process in the lung  bases and new small bilateral pleural effusions. 6. Stable bilateral adrenal gland adenomas.  Aortic Atherosclerosis (ICD10-I70.0) and Emphysema (ICD10-J43.9).   Electronically Signed   By: Marijo Sanes M.D.   On: 10/05/2019 11:58 I have independently reviewed the above radiology studies  and reviewed the findings with the patient.   Recent Lab Findings: Lab Results  Component Value Date   WBC 8.2 11/02/2019   HGB 11.1 (L) 11/02/2019   HCT 33.7 (L) 11/02/2019   PLT 201 11/02/2019   GLUCOSE 137 (H) 11/02/2019   ALT 29 11/02/2019   AST 25 11/02/2019   NA 138 11/02/2019   K 3.8 11/02/2019   CL 100 11/02/2019  CREATININE 1.53 (H) 11/02/2019   BUN 33 (H) 11/02/2019   CO2 29 11/02/2019   TSH 1.616 04/28/2019   INR 1.05 11/22/2018   HGBA1C 8.6 (H) 04/28/2019   Recent ENDOSCOPY: The IR placed gastrostomy tube was removed after the internal balloon was deflated. I then inserted a pediatric gastroscope into the gastrostomy site fistula and evaluated the stomach. There was still an ulcerated mass in the proximal stomach, approximately the same size at when I evaluated it 12/2018. I sampled the mass with forceps and sent to pathology. The stomach was otherwise normal. Following removal of the gastroscope, I reinserted the G-tube and reinflated (with saline) the internal balloon. I then used an adult gastoscope inserted via his mouth to evaluate his esophagus. There was fluid within then esophagus and after suctioning it the pinpoint distal esophagus stricture was again noted. The distal esophagus mucosa was obviously inflammed but no clear cancer was noted proximal to the stricture. I then used a TTS wire guided balloon and fluroscopy (the wire was clearly in the gastric body) to dilate the GE junction stricture to 47m, resulting in typical mucosal tear, minor oozing. I estimate the stricture length to be 2-3cm. Following dilation there was immediate reflux of gastric secretions  into his esophagus. Findings: - Previously noted proximal stomach ulcerated mass is still present, approximately unchanged in size. Biopsies taken, this appears to be residual adenocarcinoma endoscopically. - The GE junction stricture is still pinpoint and I dilated it to 787mtoday given his progressive dysphagia, sometimes even to water. - The previously placed gastrostomy tube was temporarily removed to allow access to the stomach via pediatric gastroscope and then it was replaced in good position.  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  When I explained to the patient what was involved with esophageal resection he was not interested in considering esophagectomy.  He and his wife's questions were answered.     EdGrace IsaacD      [address] [place] [id number] [phone]   11/14/2019 5:22 PM

## 2019-11-19 ENCOUNTER — Other Ambulatory Visit: Payer: Self-pay | Admitting: Oncology

## 2019-11-23 ENCOUNTER — Inpatient Hospital Stay: Payer: Medicare Other

## 2019-11-23 ENCOUNTER — Inpatient Hospital Stay (HOSPITAL_BASED_OUTPATIENT_CLINIC_OR_DEPARTMENT_OTHER): Payer: Medicare Other | Admitting: Oncology

## 2019-11-23 ENCOUNTER — Other Ambulatory Visit: Payer: Self-pay

## 2019-11-23 VITALS — BP 149/76 | HR 99 | Temp 97.2°F | Resp 19 | Ht 71.0 in | Wt 181.7 lb

## 2019-11-23 DIAGNOSIS — Z5112 Encounter for antineoplastic immunotherapy: Secondary | ICD-10-CM | POA: Diagnosis not present

## 2019-11-23 DIAGNOSIS — Z95828 Presence of other vascular implants and grafts: Secondary | ICD-10-CM

## 2019-11-23 DIAGNOSIS — C155 Malignant neoplasm of lower third of esophagus: Secondary | ICD-10-CM | POA: Diagnosis not present

## 2019-11-23 DIAGNOSIS — C159 Malignant neoplasm of esophagus, unspecified: Secondary | ICD-10-CM

## 2019-11-23 LAB — CBC WITH DIFFERENTIAL (CANCER CENTER ONLY)
Abs Immature Granulocytes: 0.13 10*3/uL — ABNORMAL HIGH (ref 0.00–0.07)
Basophils Absolute: 0 10*3/uL (ref 0.0–0.1)
Basophils Relative: 0 %
Eosinophils Absolute: 0.1 10*3/uL (ref 0.0–0.5)
Eosinophils Relative: 1 %
HCT: 32.9 % — ABNORMAL LOW (ref 39.0–52.0)
Hemoglobin: 10.7 g/dL — ABNORMAL LOW (ref 13.0–17.0)
Immature Granulocytes: 1 %
Lymphocytes Relative: 5 %
Lymphs Abs: 0.8 10*3/uL (ref 0.7–4.0)
MCH: 30.2 pg (ref 26.0–34.0)
MCHC: 32.5 g/dL (ref 30.0–36.0)
MCV: 92.9 fL (ref 80.0–100.0)
Monocytes Absolute: 1.5 10*3/uL — ABNORMAL HIGH (ref 0.1–1.0)
Monocytes Relative: 9 %
Neutro Abs: 13.8 10*3/uL — ABNORMAL HIGH (ref 1.7–7.7)
Neutrophils Relative %: 84 %
Platelet Count: 317 10*3/uL (ref 150–400)
RBC: 3.54 MIL/uL — ABNORMAL LOW (ref 4.22–5.81)
RDW: 18.6 % — ABNORMAL HIGH (ref 11.5–15.5)
WBC Count: 16.4 10*3/uL — ABNORMAL HIGH (ref 4.0–10.5)
nRBC: 0 % (ref 0.0–0.2)

## 2019-11-23 LAB — CMP (CANCER CENTER ONLY)
ALT: 33 U/L (ref 0–44)
AST: 26 U/L (ref 15–41)
Albumin: 3.1 g/dL — ABNORMAL LOW (ref 3.5–5.0)
Alkaline Phosphatase: 169 U/L — ABNORMAL HIGH (ref 38–126)
Anion gap: 11 (ref 5–15)
BUN: 37 mg/dL — ABNORMAL HIGH (ref 8–23)
CO2: 29 mmol/L (ref 22–32)
Calcium: 9.5 mg/dL (ref 8.9–10.3)
Chloride: 105 mmol/L (ref 98–111)
Creatinine: 1.8 mg/dL — ABNORMAL HIGH (ref 0.61–1.24)
GFR, Est AFR Am: 43 mL/min — ABNORMAL LOW (ref >60)
GFR, Estimated: 37 mL/min — ABNORMAL LOW (ref >60)
Glucose, Bld: 295 mg/dL — ABNORMAL HIGH (ref 70–99)
Potassium: 4 mmol/L (ref 3.5–5.1)
Sodium: 145 mmol/L (ref 135–145)
Total Bilirubin: 0.4 mg/dL (ref 0.3–1.2)
Total Protein: 8 g/dL (ref 6.5–8.1)

## 2019-11-23 LAB — CEA (IN HOUSE-CHCC): CEA (CHCC-In House): 4.85 ng/mL (ref 0.00–5.00)

## 2019-11-23 MED ORDER — TRASTUZUMAB-DKST CHEMO 150 MG IV SOLR
450.0000 mg | Freq: Once | INTRAVENOUS | Status: AC
  Administered 2019-11-23: 11:00:00 450 mg via INTRAVENOUS
  Filled 2019-11-23: qty 21.43

## 2019-11-23 MED ORDER — SODIUM CHLORIDE 0.9 % IV SOLN
Freq: Once | INTRAVENOUS | Status: AC
  Filled 2019-11-23: qty 250

## 2019-11-23 MED ORDER — ACETAMINOPHEN 160 MG/5ML PO SOLN
650.0000 mg | Freq: Once | ORAL | Status: AC
  Administered 2019-11-23: 650 mg via ORAL

## 2019-11-23 MED ORDER — FLUOROURACIL CHEMO INJECTION 2.5 GM/50ML
400.0000 mg/m2 | Freq: Once | INTRAVENOUS | Status: AC
  Administered 2019-11-23: 800 mg via INTRAVENOUS
  Filled 2019-11-23: qty 16

## 2019-11-23 MED ORDER — LEUCOVORIN CALCIUM INJECTION 350 MG
400.0000 mg/m2 | Freq: Once | INTRAVENOUS | Status: AC
  Administered 2019-11-23: 788 mg via INTRAVENOUS
  Filled 2019-11-23: qty 39.4

## 2019-11-23 MED ORDER — SODIUM CHLORIDE 0.9% FLUSH
10.0000 mL | INTRAVENOUS | Status: DC | PRN
  Administered 2019-11-23: 13:00:00 10 mL
  Filled 2019-11-23: qty 10

## 2019-11-23 MED ORDER — DIPHENHYDRAMINE HCL 50 MG/ML IJ SOLN
INTRAMUSCULAR | Status: AC
  Filled 2019-11-23: qty 1

## 2019-11-23 MED ORDER — DEXTROSE 5 % IV SOLN
Freq: Once | INTRAVENOUS | Status: DC
  Filled 2019-11-23: qty 250

## 2019-11-23 MED ORDER — SODIUM CHLORIDE 0.9 % IV SOLN
2400.0000 mg/m2 | INTRAVENOUS | Status: DC
  Administered 2019-11-23: 4750 mg via INTRAVENOUS
  Filled 2019-11-23: qty 95

## 2019-11-23 MED ORDER — ACETAMINOPHEN 160 MG/5ML PO SOLN
ORAL | Status: AC
  Filled 2019-11-23: qty 20.3

## 2019-11-23 MED ORDER — SODIUM CHLORIDE 0.9% FLUSH
10.0000 mL | INTRAVENOUS | Status: DC | PRN
  Administered 2019-11-23: 10 mL
  Filled 2019-11-23: qty 10

## 2019-11-23 MED ORDER — DIPHENHYDRAMINE HCL 50 MG/ML IJ SOLN
25.0000 mg | Freq: Once | INTRAMUSCULAR | Status: AC
  Administered 2019-11-23: 11:00:00 25 mg via INTRAVENOUS

## 2019-11-23 NOTE — Progress Notes (Signed)
[place] OFFICE PROGRESS NOTE   Diagnosis: Gastroesophageal cancer  INTERVAL HISTORY:   Lance Herring returns as scheduled.  He completed another treatment with 5-FU/Herceptin on 11/02/2019.  No mouth sores, hand/foot pain, or diarrhea.  He feels well.  He is tolerating liquids by mouth.  He continues tube feedings.  He is scheduled to see Dr. Ardis Hughs next week.  Objective:  Vital signs in last 24 hours:  Blood pressure (!) 176/93, pulse 99, temperature (!) 97.2 F (36.2 C), temperature source Temporal, resp. rate 19, height _0  (1.803 m), weight 181 lb 11.2 oz (82.4 kg), SpO2 96 %.    GI: No hepatomegaly, nontender, left upper quadrant feeding tube site with skin irritation from the tube wafer. Vascular: No leg edema  Skin: Mild hyperpigmentation of the hands  Portacath/PICC-without erythema  Lab Results:  Lab Results  Component Value Date   WBC 16.4 (H) 11/23/2019   HGB 10.7 (L) 11/23/2019   HCT 32.9 (L) 11/23/2019   MCV 92.9 11/23/2019   PLT 317 11/23/2019   NEUTROABS 13.8 (H) 11/23/2019    CMP  Lab Results  Component Value Date   NA 145 11/23/2019   K 4.0 11/23/2019   CL 105 11/23/2019   CO2 29 11/23/2019   GLUCOSE 295 (H) 11/23/2019   BUN 37 (H) 11/23/2019   CREATININE 1.80 (H) 11/23/2019   CALCIUM 9.5 11/23/2019   PROT 8.0 11/23/2019   ALBUMIN 3.1 (L) 11/23/2019   AST 26 11/23/2019   ALT 33 11/23/2019   ALKPHOS 169 (H) 11/23/2019   BILITOT 0.4 11/23/2019   GFRNONAA 37 (L) 11/23/2019   GFRAA 43 (L) 11/23/2019    Lab Results  Component Value Date   CEA1 4.41 10/05/2019     Medications: I have reviewed the patient's current medications.   Assessment/Plan: 1. 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 Partially obstructing esophagus mass 02/01/2017, biopsy confirmed moderately differentiated adenocarcinoma ? Staging CTs 03/06/2017-esophagus mass, single noncalcified pulmonary nodule the medial  aspect of the left lower lobe, evidence of prior granulomatous disease, right adrenal adenoma ? PET scan 04/07/2017-hypermetabolic distal esophageal mass, no evidence of metastatic disease in the abdomen, pelvis, or skeleton. The right adrenal mass on CT was not hypermetabolic and favored to be an adenoma ? Radiation and concurrent weekly Taxol/carboplatin 04/20/2017-05/28/2017 ? CTs 07/05/2018-improvement in esophagus mass ? Endoscopy 07/26/2017-ulceration and friability from the GE junction up to 35 cm, 2 cm mass at the GE junction-improved in size, biopsy revealed no malignancy ? PET scan 08/19/2017-hypermetabolic activity at the distal esophagus and GE junction, no evidence of distant metastatic disease ? Admission to [facility] 11/18/2018 with a syncope event secondary to upper GI bleeding ? Upper endoscopy 11/18/2018-obstructing mass at 40 cm, biopsies revealed inflammation and no malignancy ? CTs 11/20/2018-10 mm nodule at the left lung apex, thickening at the distal esophagus, evidence of prior granulomatous disease with calcified right lung nodules and right paratracheal/hilar lymph nodes ? Upper endoscopy 12/07/2026-pinpoint stricture in the distal esophagus, lumen was 1 mm. Gastroesophageal mucosa with exudate consistent with ulcer. No malignancy.  ? PET scan 12/19/2018-focal area of increased uptake at or just below the level of the GE junction; moderate FDG uptake associated with left apical lung nodule. Bilateral pleural effusions and lower lobe atelectasis. ? Endoscopy via gastrostomy tube tract 01/05/2019-mass at the gastric cardia/GE junction, biopsy confirmed adenocarcinoma, CPS-5,Her-2 3+ ? Cycle 1 FOLFOX 01/18/2019 ? Cycle 2 FOLFOX 02/02/2019 ? Cycle 3 FOLFOX 02/16/2019 ?  Cycle 4 FOLFOX 03/02/2019 ? Cycle 5 FOLFOX 03/16/2019 ? CT chest6/01/2019-new cavitary change in the 1 cm left upper lobe pulmonary nodule, stable in size. Stable circumferential wall thickening of the  lower thoracic esophagus/GE junction, no progressive disease ? Cycle 6 FOLFOX 03/30/2019, Herceptin added ? Cycle 7 FOLFOX/Herceptin 04/13/2019 ? Cycle 8 FOLFOX/Herceptin 05/09/2019 ? Cycle 9 FOLFOX/Herceptin 05/25/2019 (oxaliplatin held due to neutropenia) ? Cycle 10 FOLFOX/Herceptin 06/08/2019 ? CT chest 06/21/2019-no evidence of metastatic disease. Small cyst in the apical left upper lobe appears slightly less thick-walled than on prior exam. Distal esophageal wall thickening as before. Trace left pleural effusion. ? Cycle 11 5-FU/Herceptin 06/21/2019 ? Cycle125-FU/Herceptin 07/12/2019 ? Cycle135-FU/Herceptin 08/03/2019 ? Cycle 14 5-FU/Herceptin 08/24/2019 ? Cycle 15 5-FU/Herceptin 09/14/2019 ? CT chest 10/05/2019-progressive mid and upper esophageal distention.  No obvious mass.  Stable 8 mm left apical pulmonary nodule.  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  New inflammatory or atypical infectious process in the lung bases and new small bilateral pleural effusions. ? Cycle 16 5-FU/Herceptin 10/05/2019 ? Upper endoscopy 10/19/2019-previously noted proximal stomach ulcerated mass still present unchanged in size (biopsy proximal stomach with isolated microscopic fragment with at least intramucosal adenocarcinoma).  GE junction stricture pinpoint, dilated to 7 mm ? Cycle 17 5-FU/Herceptin 11/02/2019 ? Cycle eighteen 5-FU/Herceptin 11/23/2019 2. Upper GI bleeding secondary to #1, 11/18/2018-transfused with a total of 4 units of packed red blood cells 3. Nutrition-placement of gastrostomy feeding tube 11/22/2018  Feeding tube placed initial treatment in 2018, removed May 2019  4.Diabetes 5.Hypertension 6.Gout 7.Chronic renal failure 8. Admission 04/27/2019 with severe hyperglycemia, weight loss, and dehydration 9. 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  He saw Dr. Servando Snare.  Surgery is not recommended.  The plan is to continue 5-FU/Herceptin.  He will see Dr. Ardis Hughs next week to discuss dilation and stent options. Lance Herring will return for an office visit in 5-FU/Herceptin in 3 weeks.  Betsy Coder, MD  11/23/2019  10:11 AM

## 2019-11-23 NOTE — Progress Notes (Signed)
Labs reviewed today and per Dr. Benay Spice patient is ok to treat.  Noted Echo results from 10/30/2019- Dr. Benay Spice ok to treat with these results.

## 2019-11-25 ENCOUNTER — Inpatient Hospital Stay: Payer: Medicare Other

## 2019-11-28 ENCOUNTER — Ambulatory Visit (INDEPENDENT_AMBULATORY_CARE_PROVIDER_SITE_OTHER): Payer: Medicare Other | Admitting: Gastroenterology

## 2019-11-28 ENCOUNTER — Telehealth: Payer: Self-pay | Admitting: Oncology

## 2019-11-28 ENCOUNTER — Encounter: Payer: Self-pay | Admitting: Gastroenterology

## 2019-11-28 VITALS — BP 140/70 | HR 98 | Temp 97.2°F | Ht 71.0 in | Wt 174.8 lb

## 2019-11-28 DIAGNOSIS — C155 Malignant neoplasm of lower third of esophagus: Secondary | ICD-10-CM | POA: Diagnosis not present

## 2019-11-28 NOTE — Telephone Encounter (Signed)
Scheduled per los. Called and spoke with patient. Confirmed appt 

## 2019-11-28 NOTE — Progress Notes (Signed)
Review of pertinent gastrointestinal problems: 1. GE junction adenocarcinoma, diagnosed elsewhere in 2018 underwent radiation and chemotherapy.  [place] EGD 10/2018.  EGD Dr. Ardis Hughs February 2020 showed an obstructing mass at 40 cm.  Nearly pinpoint stricture.  He had had a radiologically placed gastrostomy tube before meeting Dr. Ardis Hughs.  Biopsies of the stricture were negative for cancer.  Repeat EGD February 2020 the GE junction tumor was approached via the gastrostomy tract and biopsies confirmed residual adenocarcinoma.  Repeat staging EGD, again via gastrostomy tube Dr. Ardis Hughs December 2020 showed same ulceration in proximal stomach and mucosal biopsies showed "at least intramucosal adenocarcinoma".  The stricture was then approached via oral route.  Nearly pinpoint stricture was dilated with a CRE TTS over-the-wire balloon dilator up to 7 mm.  I estimated the stricture length to be between 2 and 3 cm at that time.  Medical oncology care with Dr. Julieanne Manson ongoing chemotherapy  He is not a esophagectomy candidate per Dr. Servando Snare   HPI: This is a very pleasant 74 year old man whom I last saw the time of EGD about 6 weeks ago.  He is here with his wife today.  Chief complaint is GE junction adenocarcinoma  He has trouble drinking water sometimes.  Usually it will go down fine but sometimes it will well up and he will vomit.  He does not try to eat anything more than thin liquids by mouth.  His radiologically placed G-tube is functioning very well.  He uses 6 cans of Glucerna daily  ROS: complete GI ROS as described in HPI, all other review negative.  Constitutional:  No unintentional weight loss   Past Medical History:  Diagnosis Date  . Cataract   . CKD (chronic kidney disease) stage 3, GFR 30-59 ml/min   . DM2 (diabetes mellitus, type 2) ([place])   . Esophageal cancer ([place]) dx'd 2017  . Glaucoma   . Gout   . HTN (hypertension)     Past Surgical History:  Procedure Laterality  Date  . BALLOON DILATION N/A 10/19/2019   Procedure: BALLOON DILATION;  Surgeon: Milus Banister, MD;  Location: [facility];  Service: Endoscopy;  Laterality: N/A;  . BIOPSY  01/05/2019   Procedure: BIOPSY;  Surgeon: Milus Banister, MD;  Location: [facility];  Service: Endoscopy;;  . BIOPSY  10/19/2019   Procedure: BIOPSY;  Surgeon: Milus Banister, MD;  Location: [facility];  Service: Endoscopy;;  . ESOPHAGOGASTRODUODENOSCOPY (EGD) WITH PROPOFOL N/A 01/05/2019   Procedure: ESOPHAGOGASTRODUODENOSCOPY (EGD) WITH PROPOFOL;  Surgeon: Milus Banister, MD;  Location: [facility];  Service: Endoscopy;  Laterality: N/A;  . ESOPHAGOGASTRODUODENOSCOPY (EGD) WITH PROPOFOL N/A 10/19/2019   Procedure: ESOPHAGOGASTRODUODENOSCOPY (EGD) WITH PROPOFOL;  Surgeon: Milus Banister, MD;  Location: [facility];  Service: Endoscopy;  Laterality: N/A;  . IR GASTROSTOMY TUBE MOD SED  11/22/2018  . IR REPLC GASTRO/COLONIC TUBE PERCUT W/FLUORO  07/12/2019  . ORCHIECTOMY Right    Benign    Current Outpatient Medications  Medication Sig Dispense Refill  . allopurinol (ZYLOPRIM) 100 MG tablet Take 100 mg by mouth daily.    Marland Kitchen amLODipine (NORVASC) 5 MG tablet Take 5 mg by mouth daily.    Marland Kitchen gabapentin (NEURONTIN) 100 MG capsule Take 1 capsule (100 mg total) by mouth 2 (two) times daily. 60 capsule 1  . gabapentin (NEURONTIN) 300 MG capsule Take 1 capsule (300 mg total) by mouth at bedtime. 30 capsule 5  . glimepiride (AMARYL) 4 MG tablet Take 4 mg by mouth daily.    Marland Kitchen  lidocaine-prilocaine (EMLA) cream Apply 1 application topically as needed. (Patient taking differently: Apply 1 application topically as needed (port). ) 30 g 0  . Nutritional Supplements (FEEDING SUPPLEMENT, GLUCERNA 1.5 CAL,) LIQD Give 2 cartons of formula at 7am and 11am,and 1 carton at 3pm and 7pm.   Flush with 134ml water before and after each feeding. (Patient taking differently: Give 2 cartons of formula at each meal   Flush with 161ml  water before and after each feeding.) 1422 mL 5  . pioglitazone (ACTOS) 45 MG tablet Take 45 mg by mouth daily.    . potassium chloride (KLOR-CON) 20 MEQ packet PLACE 20 MEQ INTO FEEDING TUBE DAILY AS DIRECTED 90 packet 1  . promethazine (PHENERGAN) 6.25 MG/5ML syrup Take 10 mLs (12.5 mg total) by mouth every 6 (six) hours as needed for nausea or vomiting. 120 mL 1  . Travoprost, BAK Free, (TRAVATAN) 0.004 % SOLN ophthalmic solution Place 1 drop into both eyes every evening.     No current facility-administered medications for this visit.   Facility-Administered Medications Ordered in Other Visits  Medication Dose Route Frequency Provider Last Rate Last Admin  . 0.9 %  sodium chloride infusion   Intravenous Continuous Ladell Pier, MD   Stopped at 10/05/19 1516  . 0.9 %  sodium chloride infusion   Intravenous Once Ladell Pier, MD        Allergies as of 11/28/2019  . (No Known Allergies)    Family History  Problem Relation Age of Onset  . Stomach cancer Neg Hx   . Esophageal cancer Neg Hx   . Pancreatic cancer Neg Hx   . Colon cancer Neg Hx   . Liver disease Neg Hx     Social History   Socioeconomic History  . Marital status: Married    Spouse name: Altha Harm  . Number of children: Not on file  . Years of education: 49  . Highest education level: Not on file  Occupational History  . Occupation: Retired Publishing copy, [employer]  Tobacco Use  . Smoking status: Former Smoker    Types: Cigarettes    Quit date: 1980    Years since quitting: 41.1  . Smokeless tobacco: Never Used  Substance and Sexual Activity  . Alcohol use: Never  . Drug use: Never  . Sexual activity: Not Currently    Partners: Female  Other Topics Concern  . Not on file  Social History Narrative  . Not on file   Social Determinants of Health   Financial Resource Strain:   . Difficulty of Paying Living Expenses: Not on file  Food Insecurity:   . Worried About Ship broker in the Last Year: Not on file  . Ran Out of Food in the Last Year: Not on file  Transportation Needs:   . Lack of Transportation (Medical): Not on file  . Lack of Transportation (Non-Medical): Not on file  Physical Activity:   . Days of Exercise per Week: Not on file  . Minutes of Exercise per Session: Not on file  Stress:   . Feeling of Stress : Not on file  Social Connections:   . Frequency of Communication with Friends and Family: Not on file  . Frequency of Social Gatherings with Friends and Family: Not on file  . Attends Religious Services: Not on file  . Active Member of Clubs or Organizations: Not on file  . Attends Archivist Meetings: Not on file  .  Marital Status: Not on file  Intimate Partner Violence:   . Fear of Current or Ex-Partner: Not on file  . Emotionally Abused: Not on file  . Physically Abused: Not on file  . Sexually Abused: Not on file     Physical Exam: BP 140/70   Pulse 98   Temp (!) 97.2 F (36.2 C)   Ht 5\' 11"  (1.803 m)   Wt 174 lb 12.8 oz (79.3 kg)   BMI 24.38 kg/m  Constitutional: generally well-appearing Psychiatric: alert and oriented x3 Abdomen: soft, nontender, nondistended, no obvious ascites, no peritoneal signs, normal bowel sounds No peripheral edema noted in lower extremities  Assessment and plan: 74 y.o. male with GE junction adenocarcinoma  He has a malignant GE junction stricture.  He intermittently is unable to even keep down thin liquids.  We had a discussion about dilating his esophagus versus esophageal stent placement.  He still has cancer at the site, he likely has radiation related thickening and fibrosis as well.  He understands that any type of therapy at the GE junction carries risk.  Stent carries more risk than simple dilation certainly.  Fortunately he has a very well-functioning radiologically placed gastrostomy tube that he will continue to use.  I explained to him that it is very unlikely that he will  ever be able to eat normally again given how tight the stricture is and the fact that cancer is still present.  I recommended we repeat dilation with balloon dilator as certainly keeping him from having trouble with his secretions or drinking thin liquids is a reasonable and attainable goal.   He agreed and we will arrange EGD in the next 2 to 3 weeks and proceed with balloon dilation.  The goal will be to keep him from having difficulty managing his secretions, thin liquids.  He will continue using his G-tube for his nutrition, indefinitely.  Please see the "Patient Instructions" section for addition details about the plan.  Owens Loffler, MD [facility] 11/28/2019, 1:55 PM   Total time on date of encounter was 30 minutes (this included time spent preparing to see the patient reviewing records; obtaining and/or reviewing separately obtained history; performing a medically appropriate exam and/or evaluation; counseling and educating the patient and family if present; ordering medications, tests or procedures if applicable; and documenting clinical information in the health record).

## 2019-11-28 NOTE — Patient Instructions (Signed)
If you are age 72 or older, your body mass index should be between 23-30. Your Body mass index is 24.38 kg/m. If this is out of the aforementioned range listed, please consider follow up with your Primary Care Provider.  If you are age 4 or younger, your body mass index should be between 19-25. Your Body mass index is 24.38 kg/m. If this is out of the aformentioned range listed, please consider follow up with your Primary Care Provider.   You have been scheduled for an endoscopy. Please follow written instructions given to you at your visit today. If you use inhalers (even only as needed), please bring them with you on the day of your procedure.  Thank you,  Dr Ardis Hughs

## 2019-12-06 ENCOUNTER — Telehealth: Payer: Self-pay

## 2019-12-06 NOTE — Telephone Encounter (Signed)
Called patient and his wife to explain to them that EGD on 11/28/2019 would need to be rescheduled.  Patient and his wife advised that EGD has been rescheduled to 12-12-19 at 10:00am at [facility].  Patient advised to arrive at 8:30am.  Patient will have COVID testing on 12-08-19 at 12:25pm.  Patient and his wife agreed to plan and verbalized understanding.  No further questions.

## 2019-12-08 ENCOUNTER — Other Ambulatory Visit (HOSPITAL_COMMUNITY)
Admission: RE | Admit: 2019-12-08 | Discharge: 2019-12-08 | Disposition: A | Discharge status: Home / Self Care | Payer: Medicare Other | Source: Ambulatory Visit | Attending: Gastroenterology | Admitting: Gastroenterology

## 2019-12-08 DIAGNOSIS — Z01812 Encounter for preprocedural laboratory examination: Secondary | ICD-10-CM | POA: Diagnosis present

## 2019-12-08 DIAGNOSIS — Z20822 Contact with and (suspected) exposure to covid-19: Secondary | ICD-10-CM | POA: Diagnosis not present

## 2019-12-08 LAB — SARS CORONAVIRUS 2 (TAT 6-24 HRS): SARS Coronavirus 2: NEGATIVE

## 2019-12-10 ENCOUNTER — Other Ambulatory Visit: Payer: Self-pay | Admitting: Oncology

## 2019-12-12 ENCOUNTER — Telehealth: Payer: Self-pay | Admitting: Gastroenterology

## 2019-12-12 ENCOUNTER — Ambulatory Visit (HOSPITAL_COMMUNITY): Admission: RE | Admit: 2019-12-12 | Payer: Medicare Other | Source: Home / Self Care | Admitting: Gastroenterology

## 2019-12-12 SURGERY — ESOPHAGOGASTRODUODENOSCOPY (EGD) WITH PROPOFOL
Anesthesia: Monitor Anesthesia Care

## 2019-12-12 NOTE — Progress Notes (Signed)
Pt wife called this morning at 0500 and left message that her husband would not be able to come this morning to his appointment. Will let Dr. Ardis Hughs know and inform patient to call his office to reschedule.

## 2019-12-12 NOTE — Telephone Encounter (Signed)
Message left for the pt to return call  

## 2019-12-12 NOTE — Telephone Encounter (Signed)
Left message on machine to call back  

## 2019-12-12 NOTE — Telephone Encounter (Signed)
[facility] endo tells me that his wife has cancelled his EGD, dilation for today. Not sure why.  Can you contact them and ask if they want to reschedule?  thanks

## 2019-12-13 ENCOUNTER — Telehealth: Payer: Self-pay | Admitting: *Deleted

## 2019-12-13 NOTE — Telephone Encounter (Signed)
Pt's wife called explaining that pt missed appt due to going to hospital yesterday morning and is in critical condition.

## 2019-12-13 NOTE — Telephone Encounter (Addendum)
Wife called to cancel his appointments tomorrow. He is in the hospital in critical condition. Had very high glucose, then syncope. Called EMS. He is in ICU intubated. MD notified.

## 2019-12-14 ENCOUNTER — Ambulatory Visit: Payer: Medicare Other

## 2019-12-14 ENCOUNTER — Ambulatory Visit: Payer: Medicare Other | Admitting: Oncology

## 2019-12-14 ENCOUNTER — Other Ambulatory Visit: Payer: Medicare Other

## 2019-12-18 ENCOUNTER — Other Ambulatory Visit (HOSPITAL_COMMUNITY): Payer: Medicare Other

## 2019-12-19 ENCOUNTER — Telehealth: Payer: Self-pay | Admitting: *Deleted

## 2019-12-19 NOTE — Telephone Encounter (Signed)
Called wife to f/u on patient. He is still intubated and not responsive. Hospital has asked today about taking him off life support and code status. She says she needs to discuss this with the children first. She is sad that she has only been able to see him briefly once.

## 2019-12-25 DEATH — deceased

## 2020-01-29 IMAGING — CT CT CHEST WITHOUT CONTRAST
2 of 4 series · 15 of 36 positions shown, 18 images · non-contrast
Comparison: 03/30/2019.

CLINICAL DATA: Esophageal cancer, ongoing chemotherapy.

EXAM:
CT CHEST WITHOUT CONTRAST
TECHNIQUE: Multidetector CT imaging of the chest was performed following the
standard protocol without IV contrast.

[Series 2: thorax · axial · 0.82mm/px · z∈[+1294,+1570]mm · 12 of 162 slices shown, 15 images]
[im 12/162  mediastinal]
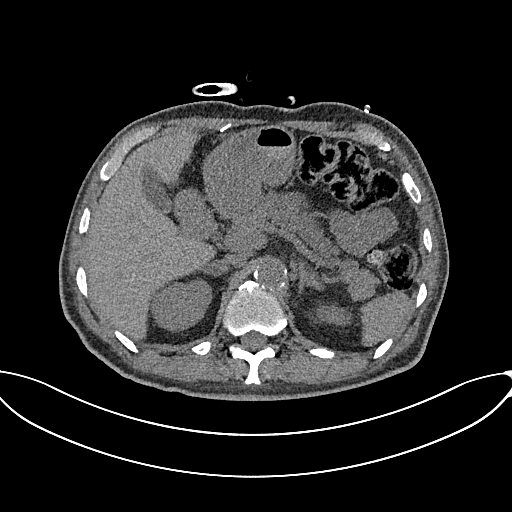
[im 12/162  lung]
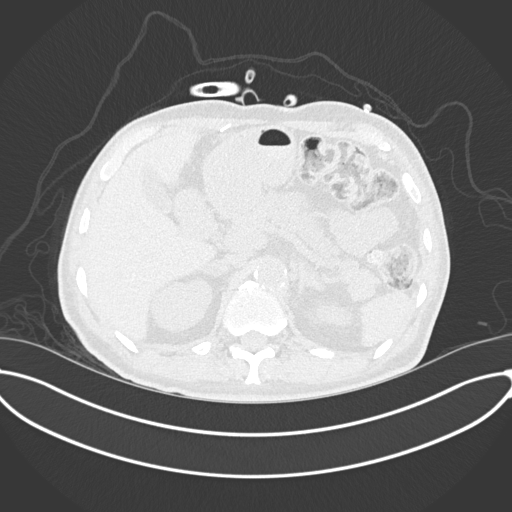
[im 24/162  lung]
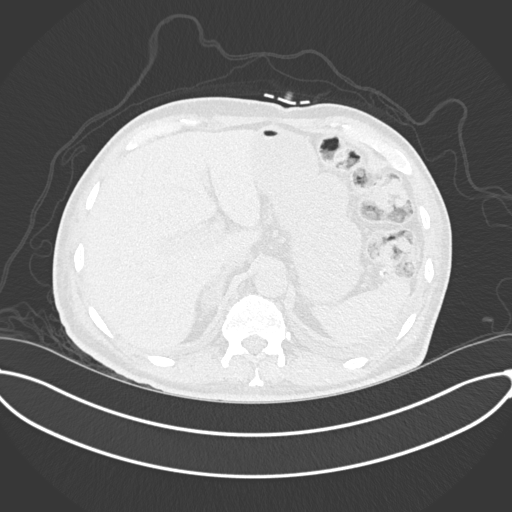
[im 35/162  lung]
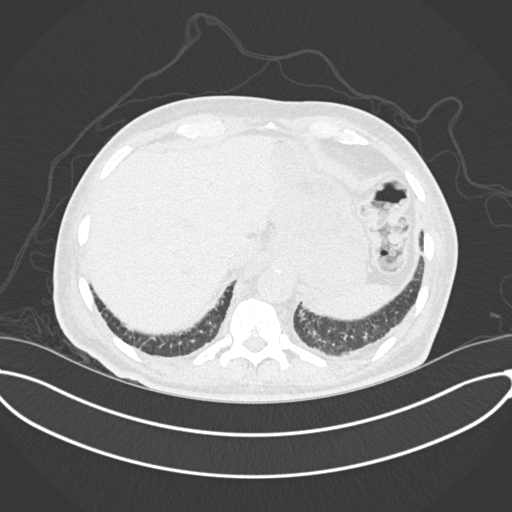
[im 47/162  lung]
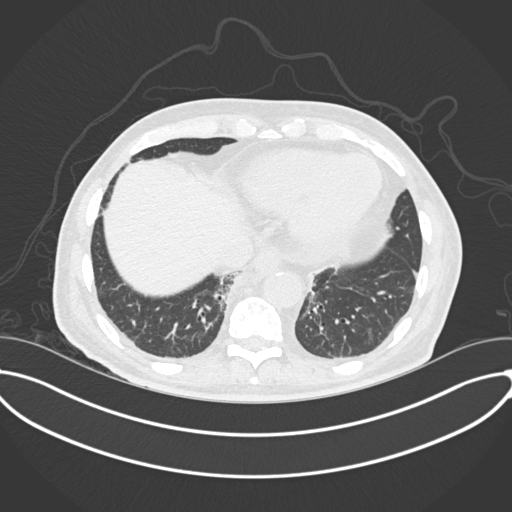
[im 58/162  mediastinal]
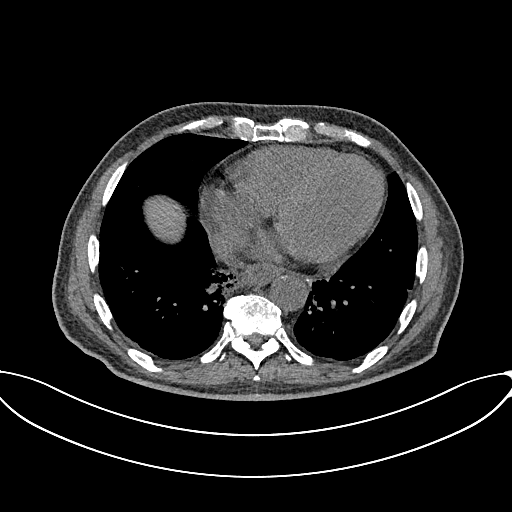
[im 58/162  lung]
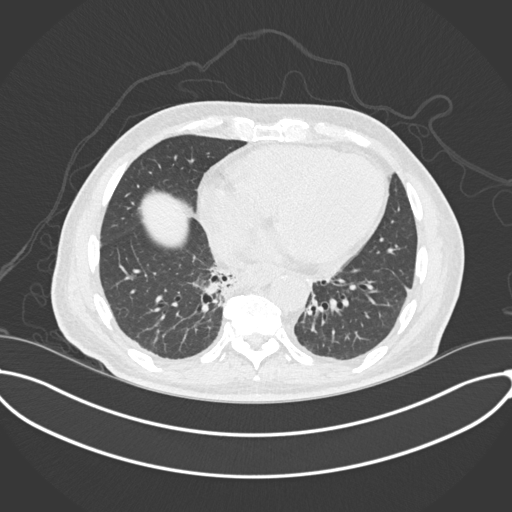
[im 70/162  lung]
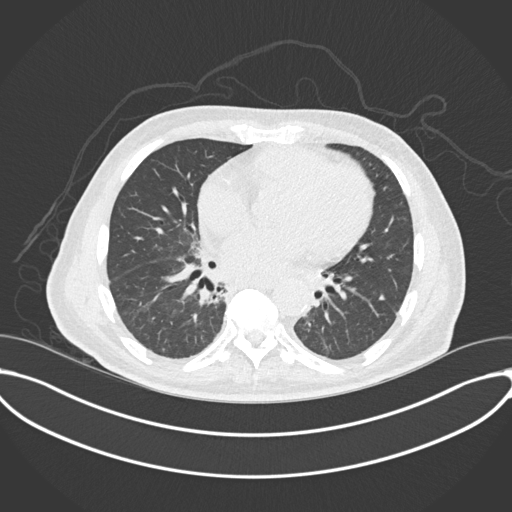
[im 93/162  lung]
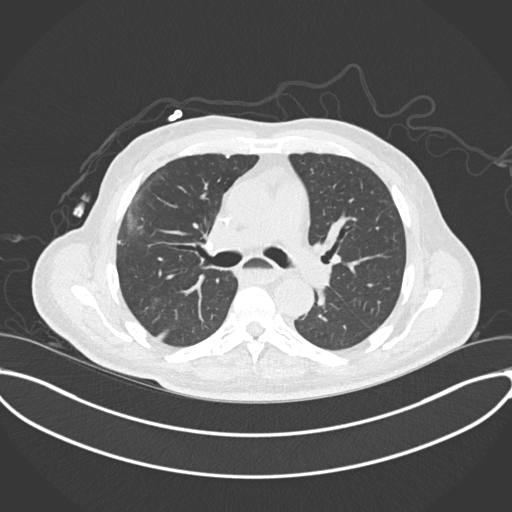
[im 104/162  lung]
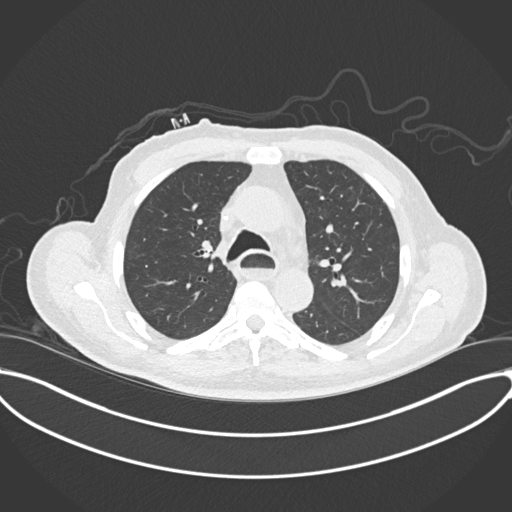
[im 116/162  mediastinal]
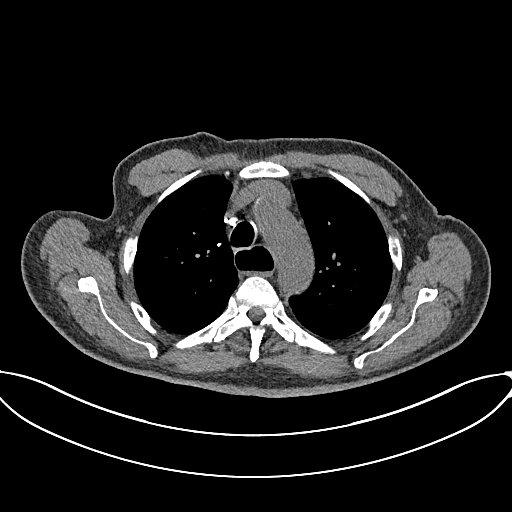
[im 116/162  lung]
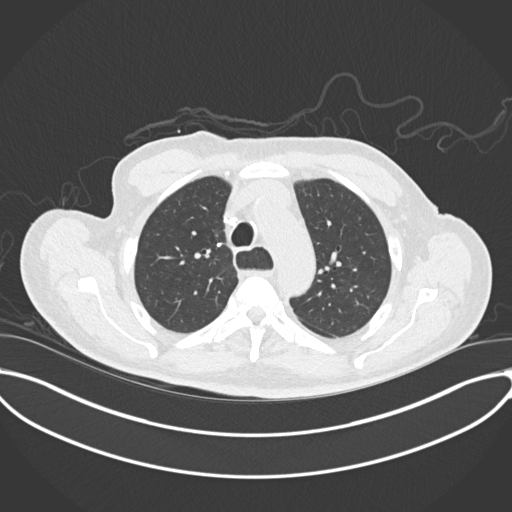
[im 127/162  lung]
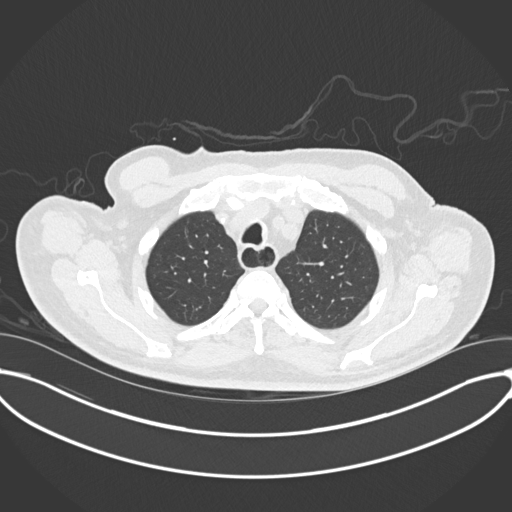
[im 139/162  lung]
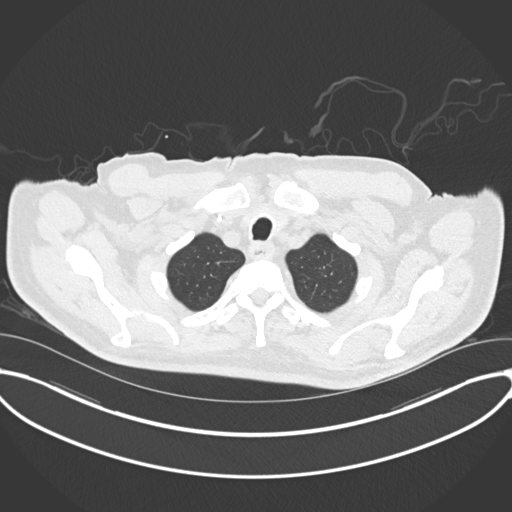
[im 150/162  lung]
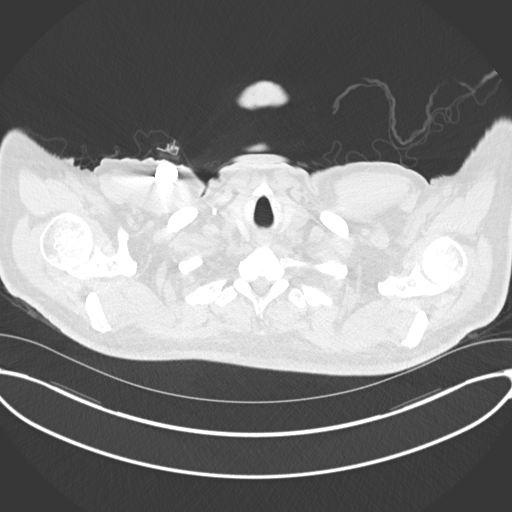

[Series 5: coronal · coronal · 0.65mm/px · 3 of 127 slices shown]
[im 26/127  lung]
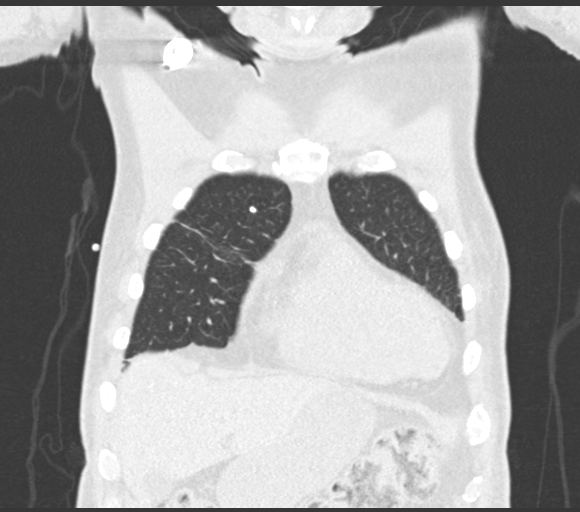
[im 51/127  lung]
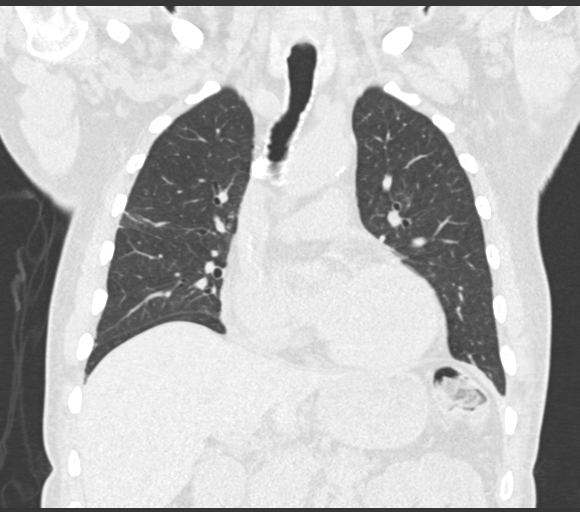
[im 76/127  lung]
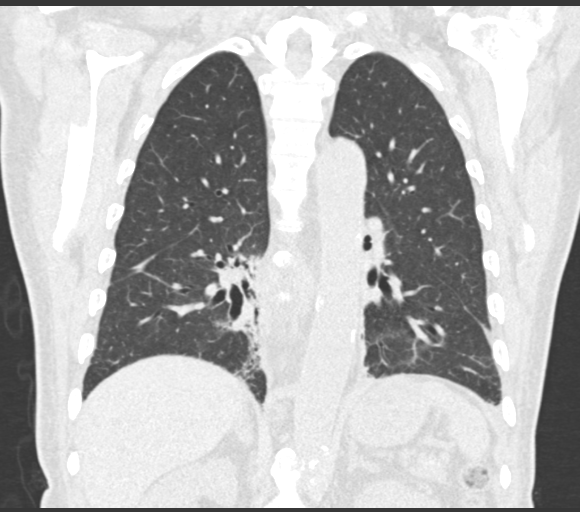

[15 of 36 positions shown; findings below may reference images not displayed]

FINDINGS: Cardiovascular: Right IJ Port-A-Cath terminates in the right atrium.
Atherosclerotic calcification of the aorta and coronary arteries.
Heart is enlarged. No pericardial effusion.

Mediastinum/Nodes: Calcified mediastinal and right hilar lymph
nodes. No pathologically enlarged mediastinal or axillary lymph
nodes. Esophagus is dilated throughout its course and contains an
air-fluid level. Distal esophagus appears thickened.

Lungs/Pleura: Scattered pulmonary parenchymal scarring, especially
in the lower lobes. Post radiation bronchiectasis, volume loss and
architectural distortion in the medial right hemithorax. Calcified
granulomas. Small cyst in the apical left upper lobe appears
slightly less thick-walled than on the prior exam ([DATE]).
Peribronchovascular ground-glass in the posterior segment right
upper lobe (7/67-71) is new and likely infectious or inflammatory in
etiology. Triangular-shaped subpleural lymph node along the left
major fissure measures 4 mm unchanged. Trace left pleural effusion.
Airway is unremarkable.

Upper Abdomen: Subcentimeter low-attenuation lesion in the dome
liver is too small to characterize. Visualized portion of the liver
is unremarkable. Tiny stones are seen in the gallbladder. Fluid
density lesions in the right adrenal gland measure 2.9 cm in the
medial limb and 2.0 cm in the lateral limb, as before. Visualized
portions of the left adrenal gland and right kidney are otherwise
unremarkable. Low-attenuation lesions in the left kidney measure up
to 2.0 cm, similar but incompletely imaged. Spleen and visualized
portion the pancreas are unremarkable. Percutaneous gastrostomy is
in place. No upper abdominal adenopathy.

Musculoskeletal: No worrisome lytic or sclerotic lesions.
IMPRESSION: 1. No evidence of metastatic disease. Small cyst in the apical left
upper lobe appears less thick-walled than on the prior exam,
indicative of positive treatment response.
2. Distal esophageal wall thickening, as before.
3. Trace left pleural effusion.
4. Cholelithiasis.
5. Right adrenal adenomas.
6. Aortic atherosclerosis (XAIYD-170.0). Coronary artery
calcification.

## 2020-05-14 IMAGING — CT CT CHEST W/O CM
2 of 3 series · 15 of 36 positions shown, 18 images · non-contrast
Comparison: 06/21/2019

CLINICAL DATA: Restaging esophageal cancer. Initial diagnosis 4909.

EXAM:
CT CHEST WITHOUT CONTRAST
TECHNIQUE: Multidetector CT imaging of the chest was performed following the
standard protocol without IV contrast.

[Series 2: thorax · axial · 0.71mm/px · z∈[-324,-44]mm · 12 of 166 slices shown, 15 images]
[im 13/166  mediastinal]
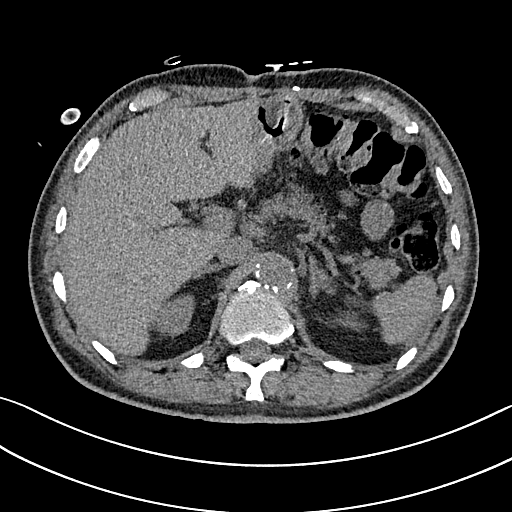
[im 13/166  lung]
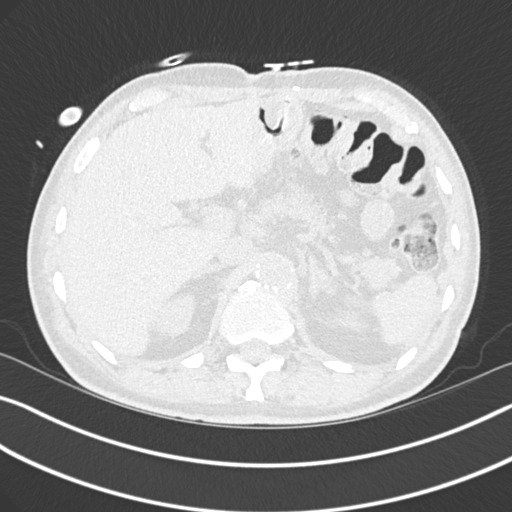
[im 25/166  lung]
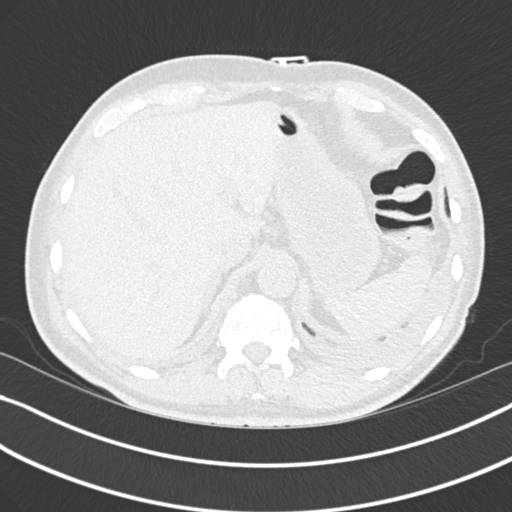
[im 37/166  lung]
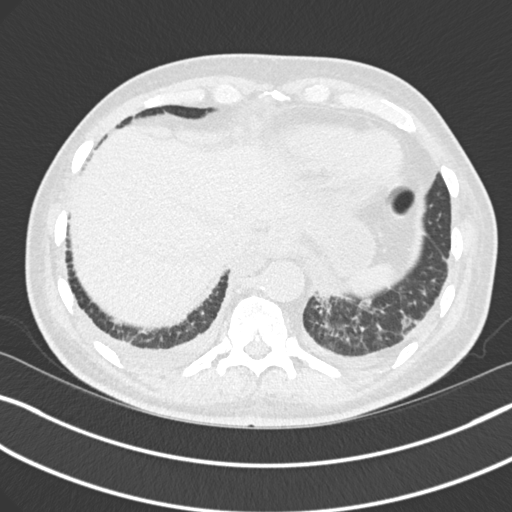
[im 49/166  lung]
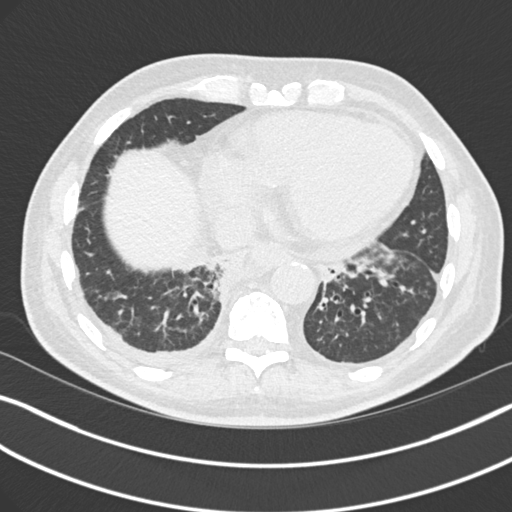
[im 62/166  mediastinal]
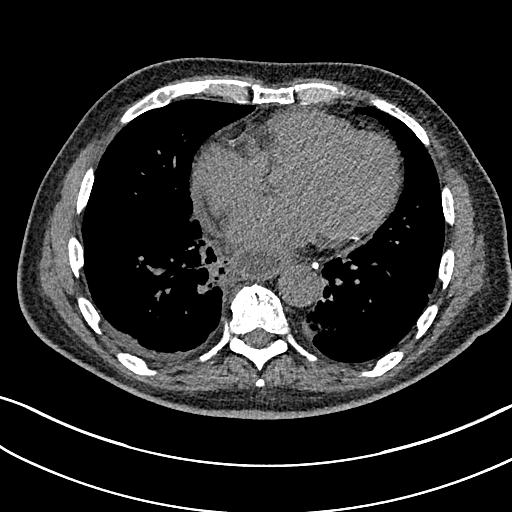
[im 62/166  lung]
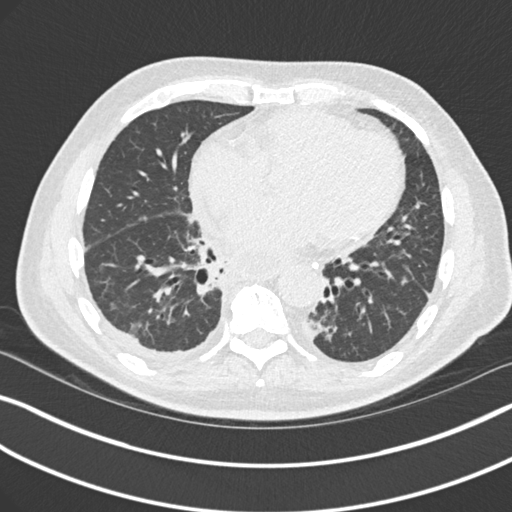
[im 74/166  lung]
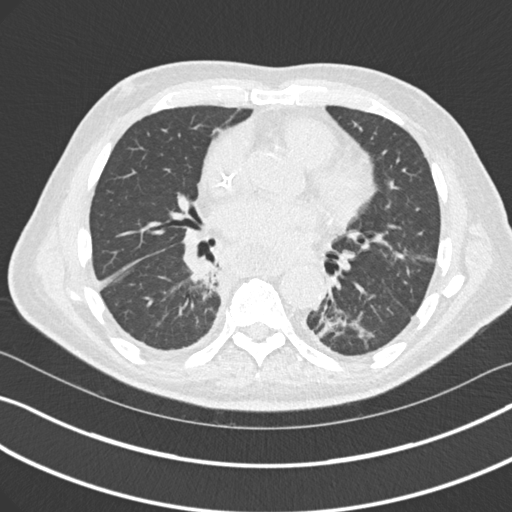
[im 92/166  lung]
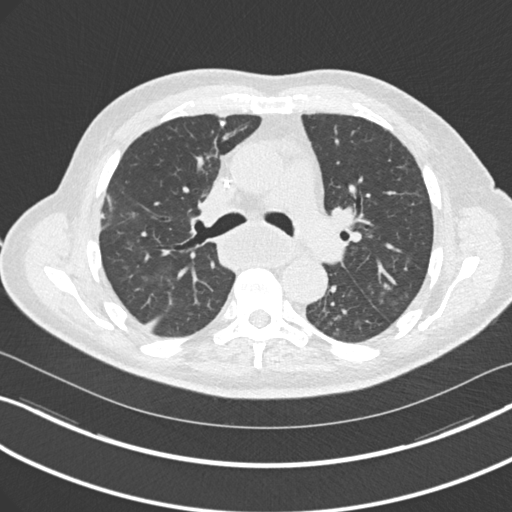
[im 104/166  lung]
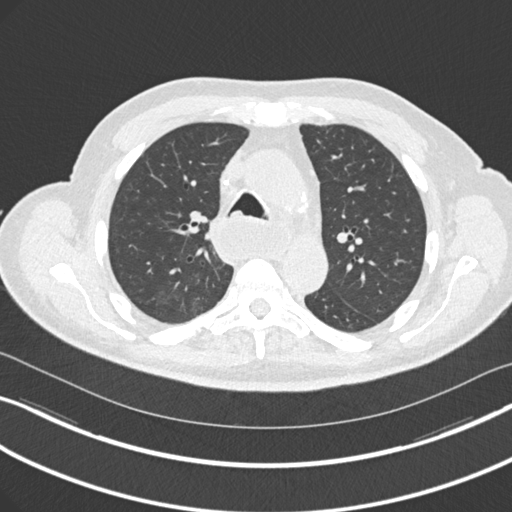
[im 117/166  mediastinal]
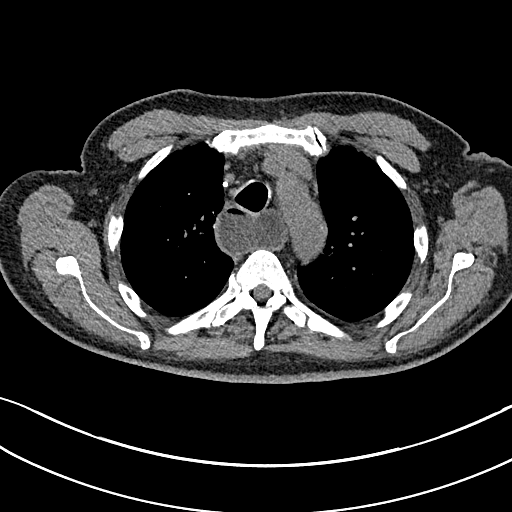
[im 117/166  lung]
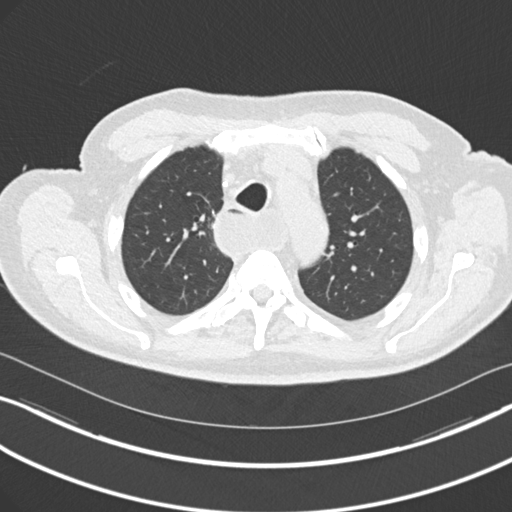
[im 129/166  lung]
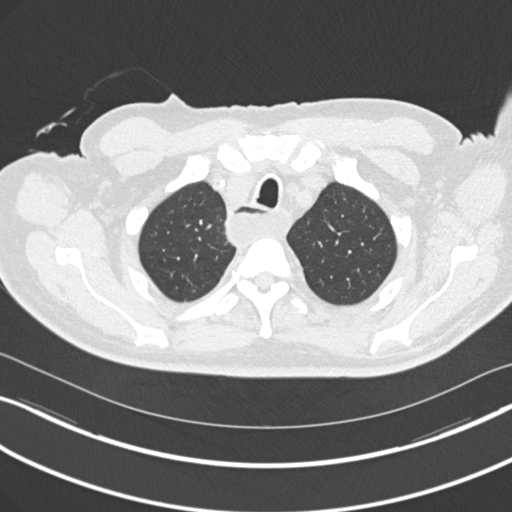
[im 141/166  lung]
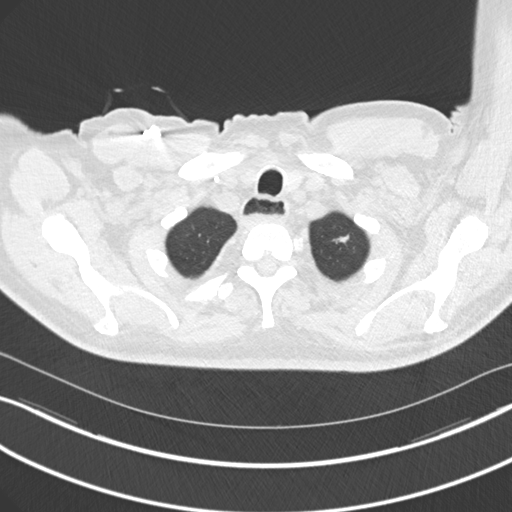
[im 153/166  lung]
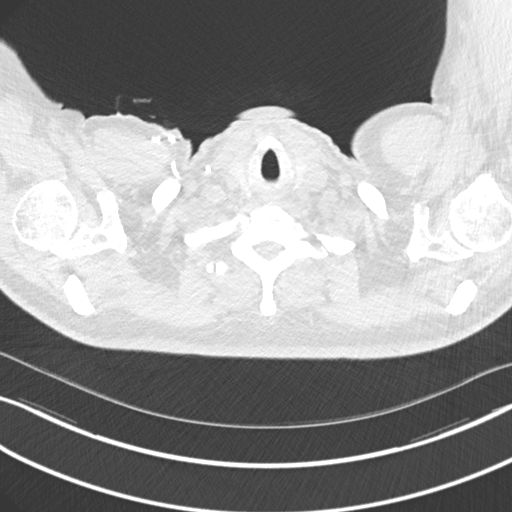

[Series 6: coronal · coronal · 0.68mm/px · 3 of 150 slices shown]
[im 30/150  lung]
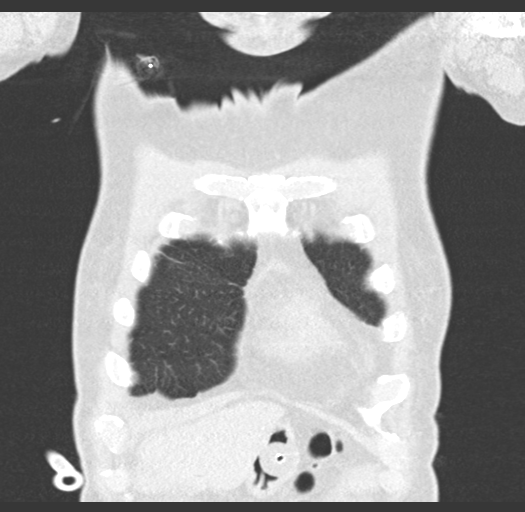
[im 60/150  lung]
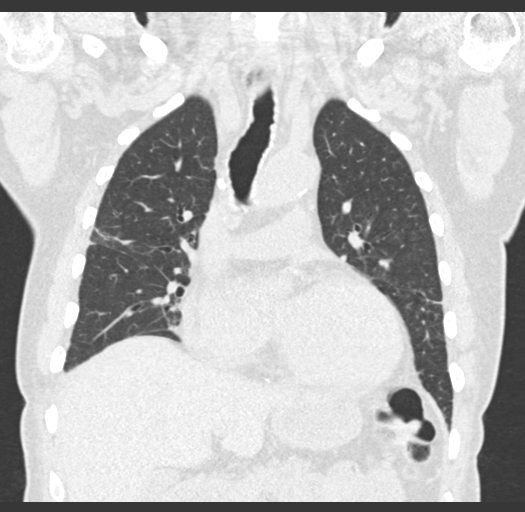
[im 90/150  lung]
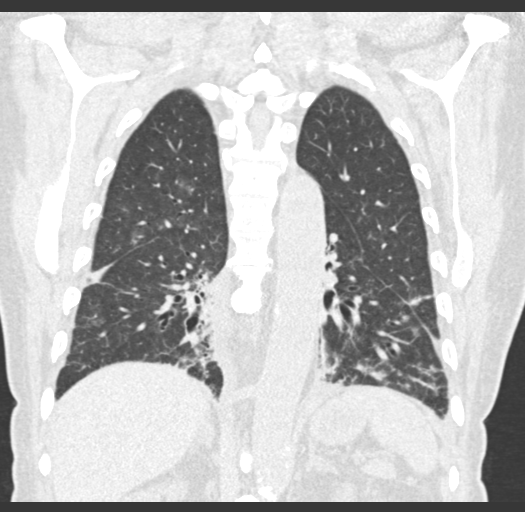

[15 of 36 positions shown; findings below may reference images not displayed]

FINDINGS: Cardiovascular: The heart is upper limits of normal in size and
stable. Small amount of pericardial fluid but no overt effusion.
Stable tortuosity and calcification of the thoracic aorta. The
right-sided Port-A-Cath is stable. Stable coronary artery
calcifications.

Mediastinum/Nodes: No obvious mediastinal or hilar adenopathy. There
are scattered small lymph nodes. Numerous calcified right hilar and
mediastinal nodes are again noted.

Progressive and fairly marked distention of the mid and upper
esophagus. Maximum diameter is 5 cm and the esophagus is filled with
fluid and small amount of air. Findings could be due to progressive
stricture ring distally possibly due to radiation change. Could not
exclude the possibility of recurrent tumor on this exam without IV
contrast but I do not see any obvious distal esophageal mass.

Lungs/Pleura: Stable emphysematous changes and pulmonary scarring.

Stable irregular nodular density at the left lung apex measuring 8
mm.

Progressive lower paramediastinal airspace consolidation and
bronchiectasis most likely progressive radiation fibrosis in both
lower lungs.

New patchy areas of tree-in-bud nodularity and faint airspace
opacities likely inflammatory process or atypical infection such as
GREFG. I do not see any worrisome pulmonary nodules to suggest
pulmonary metastatic disease. There are stable calcified granulomas
in the right lung. New small bilateral pleural effusions with
overlying atelectasis.

Upper Abdomen: Stable feeding gastrostomy tube in the antral region
of the stomach. Bilateral adrenal gland nodules are stable and
likely benign adenomas. Stable advanced vascular calcifications
involving the aorta. No upper abdominal mass or adenopathy. No
obvious hepatic lesions without contrast. Stable calcified
granulomas.

Musculoskeletal: No significant bony findings.
IMPRESSION: 1. Progressive mid and upper esophageal distension. This could be
due to progressive distal stricturing related to radiation.
Recurrent tumor is also a possibility although I do not see an
obvious mass without contrast. Further evaluation with contrast
esophagram or endoscopy may be helpful for further evaluation.
2. No mediastinal or hilar adenopathy. Stable small calcified and
noncalcified lymph nodes.
3. Stable 8 mm left apical pulmonary nodule.
4. Progressive paramediastinal lower lobe airspace consolidation and
bronchiectasis likely radiation fibrosis.
5. New inflammatory or atypical infectious process in the lung bases
and new small bilateral pleural effusions.
6. Stable bilateral adrenal gland adenomas.

Aortic Atherosclerosis (8OLFT-Y07.7) and Emphysema (8OLFT-019.E).
